# Patient Record
Sex: Female | Born: 1945 | Race: Black or African American | Hispanic: No | Smoking: Former smoker
Health system: Southern US, Community
[De-identification: ages and names within clinical notes are randomized; demographics above are authoritative.]

## PROBLEM LIST (undated history)

## (undated) DIAGNOSIS — I10 Essential (primary) hypertension: Secondary | ICD-10-CM

## (undated) DIAGNOSIS — Z923 Personal history of irradiation: Secondary | ICD-10-CM

## (undated) DIAGNOSIS — I639 Cerebral infarction, unspecified: Secondary | ICD-10-CM

## (undated) HISTORY — DX: Cerebral infarction, unspecified: I63.9

## (undated) HISTORY — PX: RENAL ARTERY ANGIOPLASTY: SHX2317

## (undated) HISTORY — PX: ABDOMINAL HYSTERECTOMY: SHX81

## (undated) HISTORY — PX: CHOLECYSTECTOMY: SHX55

---

## 1997-08-22 ENCOUNTER — Other Ambulatory Visit: Admission: RE | Admit: 1997-08-22 | Discharge: 1997-08-22 | Payer: Self-pay | Admitting: Obstetrics and Gynecology

## 1999-04-11 ENCOUNTER — Other Ambulatory Visit: Admission: RE | Admit: 1999-04-11 | Discharge: 1999-04-11 | Payer: Self-pay | Admitting: Obstetrics and Gynecology

## 2000-02-14 ENCOUNTER — Emergency Department (HOSPITAL_COMMUNITY): Admission: EM | Admit: 2000-02-14 | Discharge: 2000-02-14 | Payer: Self-pay | Admitting: Emergency Medicine

## 2004-11-16 ENCOUNTER — Ambulatory Visit (HOSPITAL_COMMUNITY): Admission: RE | Admit: 2004-11-16 | Discharge: 2004-11-16 | Payer: Self-pay | Admitting: Gastroenterology

## 2004-12-06 ENCOUNTER — Encounter (HOSPITAL_COMMUNITY): Admission: RE | Admit: 2004-12-06 | Discharge: 2004-12-27 | Payer: Self-pay | Admitting: Endocrinology

## 2005-02-08 ENCOUNTER — Encounter (HOSPITAL_COMMUNITY): Admission: RE | Admit: 2005-02-08 | Discharge: 2005-05-09 | Payer: Self-pay | Admitting: Endocrinology

## 2011-07-01 ENCOUNTER — Emergency Department (HOSPITAL_COMMUNITY): Payer: BC Managed Care – PPO

## 2011-07-01 ENCOUNTER — Inpatient Hospital Stay (HOSPITAL_COMMUNITY): Payer: BC Managed Care – PPO

## 2011-07-01 ENCOUNTER — Encounter (HOSPITAL_COMMUNITY): Payer: Self-pay | Admitting: *Deleted

## 2011-07-01 ENCOUNTER — Inpatient Hospital Stay (HOSPITAL_COMMUNITY)
Admission: EM | Admit: 2011-07-01 | Discharge: 2011-07-05 | DRG: 810 | Disposition: A | Discharge status: Rehabilitation Facility | Payer: BC Managed Care – PPO | Source: Ambulatory Visit | Attending: Infectious Disease | Admitting: Infectious Disease

## 2011-07-01 DIAGNOSIS — I619 Nontraumatic intracerebral hemorrhage, unspecified: Secondary | ICD-10-CM

## 2011-07-01 DIAGNOSIS — I7789 Other specified disorders of arteries and arterioles: Secondary | ICD-10-CM | POA: Diagnosis present

## 2011-07-01 DIAGNOSIS — Z87891 Personal history of nicotine dependence: Secondary | ICD-10-CM

## 2011-07-01 DIAGNOSIS — Z9089 Acquired absence of other organs: Secondary | ICD-10-CM

## 2011-07-01 DIAGNOSIS — I639 Cerebral infarction, unspecified: Secondary | ICD-10-CM | POA: Diagnosis present

## 2011-07-01 DIAGNOSIS — R7309 Other abnormal glucose: Secondary | ICD-10-CM | POA: Diagnosis present

## 2011-07-01 DIAGNOSIS — Z90710 Acquired absence of both cervix and uterus: Secondary | ICD-10-CM

## 2011-07-01 DIAGNOSIS — R5383 Other fatigue: Secondary | ICD-10-CM

## 2011-07-01 DIAGNOSIS — Z882 Allergy status to sulfonamides status: Secondary | ICD-10-CM

## 2011-07-01 DIAGNOSIS — R531 Weakness: Secondary | ICD-10-CM

## 2011-07-01 DIAGNOSIS — E039 Hypothyroidism, unspecified: Secondary | ICD-10-CM | POA: Diagnosis present

## 2011-07-01 DIAGNOSIS — E785 Hyperlipidemia, unspecified: Secondary | ICD-10-CM | POA: Diagnosis present

## 2011-07-01 DIAGNOSIS — D649 Anemia, unspecified: Secondary | ICD-10-CM | POA: Diagnosis present

## 2011-07-01 DIAGNOSIS — R5381 Other malaise: Secondary | ICD-10-CM

## 2011-07-01 DIAGNOSIS — I10 Essential (primary) hypertension: Secondary | ICD-10-CM

## 2011-07-01 DIAGNOSIS — E876 Hypokalemia: Secondary | ICD-10-CM | POA: Diagnosis present

## 2011-07-01 DIAGNOSIS — I150 Renovascular hypertension: Secondary | ICD-10-CM | POA: Diagnosis present

## 2011-07-01 DIAGNOSIS — I6330 Cerebral infarction due to thrombosis of unspecified cerebral artery: Secondary | ICD-10-CM

## 2011-07-01 HISTORY — DX: Personal history of irradiation: Z92.3

## 2011-07-01 HISTORY — DX: Essential (primary) hypertension: I10

## 2011-07-01 LAB — BASIC METABOLIC PANEL
BUN: 17 mg/dL (ref 6–23)
CO2: 23 mEq/L (ref 19–32)
Calcium: 9.1 mg/dL (ref 8.4–10.5)
Chloride: 97 mEq/L (ref 96–112)
Creatinine, Ser: 0.89 mg/dL (ref 0.50–1.10)
GFR calc Af Amer: 77 mL/min — ABNORMAL LOW (ref >90)
GFR calc non Af Amer: 67 mL/min — ABNORMAL LOW (ref >90)
Glucose, Bld: 161 mg/dL — ABNORMAL HIGH (ref 70–99)
Potassium: 3 mEq/L — ABNORMAL LOW (ref 3.5–5.1)
Sodium: 136 mEq/L (ref 135–145)

## 2011-07-01 LAB — URINALYSIS, ROUTINE W REFLEX MICROSCOPIC
Bilirubin Urine: NEGATIVE
Glucose, UA: NEGATIVE mg/dL
Hgb urine dipstick: NEGATIVE
Ketones, ur: NEGATIVE mg/dL
Nitrite: NEGATIVE
Protein, ur: 30 mg/dL — AB
Specific Gravity, Urine: 1.012 (ref 1.005–1.030)
Urobilinogen, UA: 0.2 mg/dL (ref 0.0–1.0)
pH: 7 (ref 5.0–8.0)

## 2011-07-01 LAB — CK TOTAL AND CKMB (NOT AT ARMC)
CK, MB: 12.1 ng/mL (ref 0.3–4.0)
Relative Index: 1.2 (ref 0.0–2.5)
Total CK: 1045 U/L — ABNORMAL HIGH (ref 7–177)

## 2011-07-01 LAB — DIFFERENTIAL
Basophils Absolute: 0 K/uL (ref 0.0–0.1)
Basophils Relative: 0 % (ref 0–1)
Eosinophils Absolute: 0 K/uL (ref 0.0–0.7)
Eosinophils Relative: 1 % (ref 0–5)
Lymphocytes Relative: 27 % (ref 12–46)
Lymphs Abs: 1.5 K/uL (ref 0.7–4.0)
Monocytes Absolute: 0.4 K/uL (ref 0.1–1.0)
Monocytes Relative: 6 % (ref 3–12)
Neutro Abs: 3.7 K/uL (ref 1.7–7.7)
Neutrophils Relative %: 66 % (ref 43–77)

## 2011-07-01 LAB — CBC
HCT: 29.1 % — ABNORMAL LOW (ref 36.0–46.0)
Hemoglobin: 10.4 g/dL — ABNORMAL LOW (ref 12.0–15.0)
Hemoglobin: 10.9 g/dL — ABNORMAL LOW (ref 12.0–15.0)
MCH: 30.4 pg (ref 26.0–34.0)
MCHC: 35.7 g/dL (ref 30.0–36.0)
MCHC: 36.2 g/dL — ABNORMAL HIGH (ref 30.0–36.0)
MCV: 85.1 fL (ref 78.0–100.0)
Platelets: 149 K/uL — ABNORMAL LOW (ref 150–400)
Platelets: 160 10*3/uL (ref 150–400)
RBC: 3.42 MIL/uL — ABNORMAL LOW (ref 3.87–5.11)
RBC: 3.56 MIL/uL — ABNORMAL LOW (ref 3.87–5.11)
RDW: 14.8 % (ref 11.5–15.5)
WBC: 5.7 K/uL (ref 4.0–10.5)

## 2011-07-01 LAB — RAPID URINE DRUG SCREEN, HOSP PERFORMED
Amphetamines: NOT DETECTED
Barbiturates: NOT DETECTED
Benzodiazepines: NOT DETECTED
Tetrahydrocannabinol: NOT DETECTED

## 2011-07-01 LAB — POCT I-STAT, CHEM 8
Chloride: 104 mEq/L (ref 96–112)
Creatinine, Ser: 1 mg/dL (ref 0.50–1.10)
Glucose, Bld: 162 mg/dL — ABNORMAL HIGH (ref 70–99)
Potassium: 3 mEq/L — ABNORMAL LOW (ref 3.5–5.1)

## 2011-07-01 LAB — MRSA PCR SCREENING: MRSA by PCR: NEGATIVE

## 2011-07-01 LAB — HEPATIC FUNCTION PANEL
ALT: 30 U/L (ref 0–35)
AST: 44 U/L — ABNORMAL HIGH (ref 0–37)
Albumin: 4 g/dL (ref 3.5–5.2)
Total Bilirubin: 0.3 mg/dL (ref 0.3–1.2)

## 2011-07-01 LAB — URINE MICROSCOPIC-ADD ON

## 2011-07-01 LAB — RETICULOCYTES
RBC.: 3.56 MIL/uL — ABNORMAL LOW (ref 3.87–5.11)
Retic Ct Pct: 1.4 % (ref 0.4–3.1)

## 2011-07-01 LAB — CREATININE, SERUM
Creatinine, Ser: 0.79 mg/dL (ref 0.50–1.10)
GFR calc non Af Amer: 86 mL/min — ABNORMAL LOW (ref >90)

## 2011-07-01 LAB — PROTIME-INR
INR: 1 (ref 0.00–1.49)
Prothrombin Time: 13.4 seconds (ref 11.6–15.2)

## 2011-07-01 LAB — HEMOGLOBIN A1C: Hgb A1c MFr Bld: 6.2 % — ABNORMAL HIGH (ref <5.7)

## 2011-07-01 LAB — APTT: aPTT: 20 seconds — ABNORMAL LOW (ref 24–37)

## 2011-07-01 LAB — TROPONIN I: Troponin I: <0.3 ng/mL (ref <0.30)

## 2011-07-01 MED ORDER — SENNOSIDES-DOCUSATE SODIUM 8.6-50 MG PO TABS
1.0000 | ORAL_TABLET | Freq: Every evening | ORAL | Status: DC | PRN
  Administered 2011-07-01: 1 via ORAL
  Filled 2011-07-01: qty 1

## 2011-07-01 MED ORDER — ONDANSETRON HCL 4 MG/2ML IJ SOLN
4.0000 mg | Freq: Four times a day (QID) | INTRAMUSCULAR | Status: DC | PRN
  Administered 2011-07-01: 4 mg via INTRAVENOUS
  Filled 2011-07-01: qty 2

## 2011-07-01 MED ORDER — SENNOSIDES-DOCUSATE SODIUM 8.6-50 MG PO TABS
1.0000 | ORAL_TABLET | Freq: Two times a day (BID) | ORAL | Status: DC
  Administered 2011-07-02 – 2011-07-03 (×4): 1 via ORAL
  Filled 2011-07-01 (×9): qty 1

## 2011-07-01 MED ORDER — TRAMADOL HCL 50 MG PO TABS
50.0000 mg | ORAL_TABLET | Freq: Four times a day (QID) | ORAL | Status: DC | PRN
  Filled 2011-07-01: qty 2

## 2011-07-01 MED ORDER — LABETALOL HCL 5 MG/ML IV SOLN: 10.0000 mg | INTRAVENOUS | Status: DC | PRN

## 2011-07-01 MED ORDER — SODIUM CHLORIDE 0.9 % IV BOLUS (SEPSIS)
1000.0000 mL | Freq: Once | INTRAVENOUS | Status: AC
  Administered 2011-07-01: 1000 mL via INTRAVENOUS

## 2011-07-01 MED ORDER — POTASSIUM CHLORIDE 20 MEQ/15ML (10%) PO LIQD
ORAL | Status: AC
  Filled 2011-07-01: qty 30

## 2011-07-01 MED ORDER — POTASSIUM CHLORIDE 20 MEQ/15ML (10%) PO LIQD
40.0000 meq | ORAL | Status: AC
  Administered 2011-07-01 (×2): 40 meq via ORAL
  Filled 2011-07-01 (×2): qty 30

## 2011-07-01 MED ORDER — PANTOPRAZOLE SODIUM 40 MG IV SOLR
40.0000 mg | Freq: Every day | INTRAVENOUS | Status: DC
  Administered 2011-07-01: 40 mg via INTRAVENOUS
  Filled 2011-07-01 (×2): qty 40

## 2011-07-01 MED ORDER — SODIUM CHLORIDE 0.9 % IV SOLN
INTRAVENOUS | Status: DC
  Administered 2011-07-01 – 2011-07-02 (×4): via INTRAVENOUS

## 2011-07-01 MED ORDER — ACETAMINOPHEN 650 MG RE SUPP: 650.0000 mg | RECTAL | Status: DC | PRN

## 2011-07-01 MED ORDER — HEPARIN SODIUM (PORCINE) 5000 UNIT/ML IJ SOLN
5000.0000 [IU] | Freq: Three times a day (TID) | INTRAMUSCULAR | Status: DC
  Filled 2011-07-01 (×2): qty 1

## 2011-07-01 MED ORDER — ACETAMINOPHEN 325 MG PO TABS: 650.0000 mg | ORAL_TABLET | ORAL | Status: DC | PRN

## 2011-07-01 MED ORDER — LABETALOL HCL 5 MG/ML IV SOLN
10.0000 mg | INTRAVENOUS | Status: DC | PRN
  Administered 2011-07-01 (×2): 40 mg via INTRAVENOUS
  Administered 2011-07-01: 20 mg via INTRAVENOUS
  Administered 2011-07-01 – 2011-07-02 (×2): 10 mg via INTRAVENOUS
  Administered 2011-07-02: 40 mg via INTRAVENOUS
  Administered 2011-07-02 (×2): 20 mg via INTRAVENOUS
  Administered 2011-07-02: 10 mg via INTRAVENOUS
  Administered 2011-07-02: 40 mg via INTRAVENOUS
  Administered 2011-07-02 – 2011-07-03 (×2): 20 mg via INTRAVENOUS
  Administered 2011-07-03 (×2): 40 mg via INTRAVENOUS
  Administered 2011-07-03 – 2011-07-04 (×2): 20 mg via INTRAVENOUS
  Filled 2011-07-01: qty 8
  Filled 2011-07-01 (×2): qty 4
  Filled 2011-07-01 (×2): qty 8
  Filled 2011-07-01: qty 4
  Filled 2011-07-01 (×5): qty 8
  Filled 2011-07-01: qty 4
  Filled 2011-07-01 (×4): qty 8
  Filled 2011-07-01: qty 4

## 2011-07-01 MED ORDER — MORPHINE SULFATE 2 MG/ML IJ SOLN: 1.0000 mg | INTRAMUSCULAR | Status: DC | PRN

## 2011-07-01 NOTE — ED Notes (Signed)
Patient unable to stand to finish orthostatic vital signs

## 2011-07-01 NOTE — ED Notes (Signed)
Jacqueline King- pt brother [phone] home [phone] cell Call with any changes.

## 2011-07-01 NOTE — ED Provider Notes (Signed)
65yo GCS 15 last known well 11:00 AM this morning and sudden onset generalized weakness, pre-syncope, vertigo, gait ataxia, slightly slurred and slowed speech without lateralizing weakness or numbness or facial asymmetry or difficulty swallowing and no headache and no pain no chest pain no shortness breath.  PERRL, EOMI, multi-directional nystagmus including vertigo and rotary as well as lateral present even at rest, also has slightly disconjugate gaze when each eye is covered and uncovered rapidly, no facial asymmetry, normal facial light touch, no pronator drift in arms or legs normal light touch in arms and legs normal bilateral finger to nose testing, the patient is able to sit unassisted without truncal ataxia but is not able to stand and walk due to gait ataxia with vertigo  Medical screening examination/treatment/procedure(s) were conducted as a shared visit with non-physician practitioner(s) and myself.  I personally evaluated the patient during the encounter  [facility]w [facility] recs no Code Stroke since non-focal; tPA in stroke considered, but not given due to the following: Unable to determine eligibility (Dx unclear and nonfocal)  MR d/w rads and Neuro.  Dx: Intracranial Hemorrhage Stroke Vertigo  CRITICAL CARE Performed by: Hurman Horn   Total critical care time:  Critical care time was exclusive of separately billable procedures and treating other patients.  Critical care was necessary to treat or prevent imminent or life-threatening deterioration.  Critical care was time spent personally by me on the following activities: development of treatment plan with patient and/or surrogate as well as nursing, discussions with consultants, evaluation of patient's response to treatment, examination of patient, obtaining history from patient or surrogate, ordering and performing treatments and interventions, ordering and review of laboratory studies, ordering and review of  radiographic studies, pulse oximetry and re-evaluation of patient's condition.  Hurman Horn, MD 07/01/11 [phone]

## 2011-07-01 NOTE — H&P (Addendum)
Medical Student Hospital Admission Note Date: 07/01/2011  Patient name: Jacqueline King Medical record number: [id number] Date of birth: 11/13/1945 Age: 66 y.o. Gender: female PCP: DEFAULT,PROVIDER, MD, MD  Medical Service: Toniann Ket  Attending physician: Dr. Daiva Eves      Chief Complaint: Weakness and Dizziness  History of Present Illness: 66 yo female with a PMH of uncontrolled hypertension presented to the ED with dizziness that started this morning while she was at work. Once the dizziness started, she laid down on the floor. She also complained of nausea. There was no LOC, weakness, vomiting, or headache. No alleviating factors, but movement exacerbated her dizziness  Meds: None.  Allergies: Allergies as of 07/01/2011 - Review Complete 07/01/2011  Allergen Reaction Noted  . Sulfa drugs cross reactors Hives 07/01/2011   Past Medical History  Diagnosis Date  . Hypertension    Past Surgical History  Procedure Date  . Abdominal hysterectomy   . Cholecystectomy    No family history on file. History   Social History  . Marital Status: Divorced    Spouse Name: N/A    Number of Children: N/A  . Years of Education: N/A   Occupational History  . Not on file.   Social History Main Topics  . Smoking status: Former Games developer  . Smokeless tobacco: Not on file  . Alcohol Use: No  . Drug Use:   . Sexually Active:    Other Topics Concern  . Not on file   Social History Narrative  . No narrative on file    Review of Systems:  General: no fevers, chills, changes in weight, changes in appetite Skin: no rash HEENT: blurry vision, no hearing changes, no sore throat Pulm: no dyspnea, coughing, wheezing CV: no chest pain, palpitations, shortness of breath Abd: no abdominal pain, nausea/vomiting, diarrhea/constipation GU: no dysuria, hematuria, polyuria Ext: no arthralgias, myalgias Neuro: dizziness, no numbness or tingling   Physical Exam: Blood pressure 206/104, pulse 64,  temperature 97.5 F (36.4 C), temperature source Oral, resp. rate 13, SpO2 96.00%.  General: resting in bed, NAD, slow speech HEENT: PERRL, EOMI, no scleral icterus Cardiac: RRR, no rubs, murmurs or gallops Pulm: clear to auscultation bilaterally, moving normal volumes of air Abd: soft, nontender, nondistended, BS present Ext: warm and well perfused, no pedal edema Neuro: alert and oriented X3, cranial nerves II-XII grossly intact    Lab results: Basic Metabolic Panel:  Basename 07/01/11 1401 07/01/11 1250  NA 140 136  K 3.0* 3.0*  CL 104 97  CO2 -- 23  GLUCOSE 162* 161*  BUN 17 17  CREATININE 1.00 0.89  CALCIUM -- 9.1  MG -- --  PHOS -- --   Liver Function Tests:  [facility] 07/01/11 1342  AST 44*  ALT 30  ALKPHOS 43  BILITOT 0.3  PROT 7.4  ALBUMIN 4.0   CBC:  Basename 07/01/11 1401 07/01/11 1250  WBC -- 5.7  NEUTROABS -- 3.7  HGB 10.5* 10.4*  HCT 31.0* 29.1*  MCV -- 85.1  PLT -- 149*   Cardiac Enzymes:  Basename 07/01/11 1355 07/01/11 1255  CKTOTAL 1045* --  CKMB 12.1* --  CKMBINDEX -- --  TROPONINI -- <0.30   CBG:  Basename 07/01/11 1412  GLUCAP 147*   Coagulation:  Basename 07/01/11 1341  LABPROT 13.4  INR 1.00   Urine Drug Screen: Drugs of Abuse     Component Value Date/Time   LABOPIA NONE DETECTED 07/01/2011 1336   COCAINSCRNUR NONE DETECTED 07/01/2011 1336   LABBENZ  NONE DETECTED 07/01/2011 1336   AMPHETMU NONE DETECTED 07/01/2011 1336   THCU NONE DETECTED 07/01/2011 1336   LABBARB NONE DETECTED 07/01/2011 1336    Urinalysis:  Basename 07/01/11 1318  COLORURINE YELLOW  LABSPEC 1.012  PHURINE 7.0  GLUCOSEU NEGATIVE  HGBUR NEGATIVE  BILIRUBINUR NEGATIVE  KETONESUR NEGATIVE  PROTEINUR 30*  UROBILINOGEN 0.2  NITRITE NEGATIVE  LEUKOCYTESUR TRACE*   Imaging results:  Ct Head Wo Contrast  07/01/2011  *RADIOLOGY REPORT*  Clinical Data: Near syncope  CT HEAD WITHOUT CONTRAST  Technique:  Contiguous axial images were obtained from the base  of the skull through the vertex without contrast.  Comparison: None.  Findings: There is asymmetric curvilinear high density along the right aspect of the fourth ventricle on image number 13.  Linear high density projects within the central portion of the fourth ventricle and third ventricle on images 12 and 13.  This finding could reflect benign prominent choroid plexus, however a small amount of acute hemorrhage is difficult to exclude.  There are no prior studies for comparison.  There is patchy hypodensity in the periventricular white matter bilaterally suggesting chronic microvascular ischemic changes.  The ventricles are normal in size.  No evidence of acute cortically based infarction.  Negative for mass effect or midline shift.  The visualized paranasal sinuses, mastoid air cells, and middle ears are clear.  The mastoid air cells are clear.  The skull is intact.  Soft tissues of the scalp are symmetric.  IMPRESSION: 1. A small amount of acute intraventricular hemorrhage cannot be excluded in the third and fourth ventricles.  Alternatively, this high density could reflect prominent choroid plexus.  Further evaluation of the brain with and without contrast is suggested. Critical Value/emergent results were called by telephone at the time of interpretation on 07/01/2011  at 3:05 p.m.  to  Dr. Fonnie Jarvis, who verbally acknowledged these results. 2.  Chronic microvascular ischemic changes.  Original Report Authenticated By: Britta Mccreedy, M.D.   Dg Chest Port 1 View  07/01/2011  *RADIOLOGY REPORT*  Clinical Data: Dizzy, nausea  PORTABLE CHEST - 1 VIEW  Comparison: None.  Findings: Mild cardiomegaly.  Suspect left pleural effusion. Atelectasis or infiltrate at the left lung base.  Low lung volumes with resultant crowding of bronchovascular structures.  Regional bones unremarkable.  IMPRESSION:  1.  Probable left pleural effusion and left lower lung infiltrate or atelectasis. 2.  Mild cardiomegaly.  Original Report  Authenticated By: Osa Craver, M.D.    Other results: EKG: normal EKG, normal sinus rhythm, unchanged from previous tracings, sinus bradycardia.  Assessment & Plan by Problem:  Dizziness Based on patient's history of an episode of weakness and dizziness, coupled with hypertension, we wanted to work up stroke/TIA:  -CT done - inconclusive. Therefore MRI ordered to check for cerebral infarcts  Differentials: Stroke/TIA vs. Hypothyroidism vs. Orthostatic hypotension vs. MS vs. Subdural Hematoma (r/o from CT scan) vs.  Seizure vs. Tumor (r/o with CT) vs. Electrolyte Disturbance.  Hypertension Hypertension will not be immediately addressed as the possibility of ischemic stroke exists therefore lowering the blood pressure is contraindicated.  Elevated Cardiac Enzymes Troponins are negative and CK MB and total CK are increased indicated cardiac muscle injury. MI can be r/o once Tropinins are negative x 3 (presently negative X 1).   Anemia Anemia can be secondary to GI loss vs. Iron deficiency anemia  -Ferrous Sulfate -Iron Panel -Stool for occult blood/Rectal Exam  Hypokalemia Low potassium can cause cardiac changes and muscle  weakness.  -K Dur 40mg  PO X 2  Hyperglycemia Pt has no history of diabetes, but we will check HbA1c to see glucose status.  -No immediate treatment  Proteinuria Possibly due to HTN or an undiagnosed diabetes causing glomerular damage.  -urine dipstick  S/p Radioactive therapy for hyperthyroidism Pt was prescribed to be taking synthroid after her radioactive therapy but has not been on any medication. We will check her TSH and medicate appropriately with Synthroid.   This is a Psychologist, occupational Note.  The care of the patient was discussed with Dr. Gilford Rile and the assessment and plan was formulated with their assistance.  Please see their note for official documentation of the patient encounter.   Signed: Verlin Grills 07/01/2011, 4:19 PM        Resident Hospital Admission Note Date: 07/01/2011  Patient name: Jacqueline King Medical record number: [id number] Date of birth: [birth year] Age: 66 y.o. Gender: female PCP: DEFAULT,PROVIDER, MD, MD  Medical Service: Leitha Bleak  Attending physician: Dr. Daiva Eves   1st Contact:     T. Sherryll Burger                                  Pager: [phone] 2nd Contact:    Dr. Dorthula Rue                          Pager: [phone]  After 5 pm or weekends: 1st Contact:      Pager: [phone] 2nd Contact:      Pager: [phone]   Chief Complaint: Dizziness   History of Present Illness:  Patient is a 66 year old female with a past medical history significant for thyroid ablation, who presented to the [place] ED reporting feeling of dizziness and a sensation that the room was moving and she was feeling like she was going to pass out so she lowered herself to the ground. Patient reports that when she closes her eyes the dizziness seems to be better. The patient denies chest pain, shortness of breath, abdominal pain, vomiting, visual changes, fever, headache, or dysuria. Patient does report nausea since the onset of her symptoms. Head CT obtained in the ED was negative for acute stroke, neuro hospitalist were consulted, I suggested a medical admission with neurology consult for further workup of possible acute stroke.   Meds: No prescriptions prior to admission    Allergies: Allergies as of 07/01/2011 - Review Complete 07/01/2011  Allergen Reaction Noted  . Sulfa drugs cross reactors Hives 07/01/2011   Past Medical History  Diagnosis Date  . Hypertension   . H/O radioactive iodine thyroid ablation    Past Surgical History  Procedure Date  . Abdominal hysterectomy   . Cholecystectomy   . Angioplasty 1993 or 1994   Family History  Problem Relation Age of Onset  . Diabetes Mother   . Diabetes Sister   . Diabetes Brother    History   Social History  . Marital Status: Divorced    Spouse Name: N/A     Number of Children: N/A  . Years of Education: N/A   Occupational History  . Not on file.   Social History Main Topics  . Smoking status: Former Games developer  . Smokeless tobacco: Not on file  . Alcohol Use: No  . Drug Use: No  . Sexually Active: Not on file   Other Topics Concern  . Not on file   Social  History Narrative  . No narrative on file    Review of Systems: Negative except per history of present  Physical Exam: Blood pressure 188/114, pulse 67, temperature 97.4 F (36.3 C), temperature source Oral, resp. rate 15, height 5\' 1"  (1.549 m), weight 128 lb 1.6 oz (58.106 kg), SpO2 99.00%. General: resting in bed, NAD, slow speech HEENT: PERRL, EOMI, no scleral icterus Cardiac: RRR, no rubs, murmurs or gallops Pulm: clear to auscultation bilaterally Abd: soft, nontender, nondistended, BS present Ext: warm and well perfused, no pedal edema Neuro: alert and oriented X3, cranial nerves II-XII grossly intact, no major focal abnormality detected.   Lab results: Basic Metabolic Panel:  Basename 07/01/11 1401 07/01/11 1250  NA 140 136  K 3.0* 3.0*  CL 104 97  CO2 -- 23  GLUCOSE 162* 161*  BUN 17 17  CREATININE 1.00 0.89  CALCIUM -- 9.1  MG -- --  PHOS -- --   Liver Function Tests:  Basename 07/01/11 1342  AST 44*  ALT 30  ALKPHOS 43  BILITOT 0.3  PROT 7.4  ALBUMIN 4.0   CBC:  Basename 07/01/11 1401 07/01/11 1250  WBC -- 5.7  NEUTROABS -- 3.7  HGB 10.5* 10.4*  HCT 31.0* 29.1*  MCV -- 85.1  PLT -- 149*   Cardiac Enzymes:  Basename 07/01/11 1355 07/01/11 1255  CKTOTAL 1045* --  CKMB 12.1* --  CKMBINDEX -- --  TROPONINI -- <0.30   CBG:  Basename 07/01/11 1412  GLUCAP 147*   Coagulation:  Basename 07/01/11 1341  LABPROT 13.4  INR 1.00   Urine Drug Screen: Drugs of Abuse     Component Value Date/Time   LABOPIA NONE DETECTED 07/01/2011 1336   COCAINSCRNUR NONE DETECTED 07/01/2011 1336   LABBENZ NONE DETECTED 07/01/2011 1336   AMPHETMU NONE  DETECTED 07/01/2011 1336   THCU NONE DETECTED 07/01/2011 1336   LABBARB NONE DETECTED 07/01/2011 1336    Urinalysis:  Basename 07/01/11 1318  COLORURINE YELLOW  LABSPEC 1.012  PHURINE 7.0  GLUCOSEU NEGATIVE  HGBUR NEGATIVE  BILIRUBINUR NEGATIVE  KETONESUR NEGATIVE  PROTEINUR 30*  UROBILINOGEN 0.2  NITRITE NEGATIVE  LEUKOCYTESUR TRACE*    Imaging results:  Ct Head Wo Contrast  07/01/2011  *RADIOLOGY REPORT*  Clinical Data: Near syncope  CT HEAD WITHOUT CONTRAST  Technique:  Contiguous axial images were obtained from the base of the skull through the vertex without contrast.  Comparison: None.  Findings: There is asymmetric curvilinear high density along the right aspect of the fourth ventricle on image number 13.  Linear high density projects within the central portion of the fourth ventricle and third ventricle on images 12 and 13.  This finding could reflect benign prominent choroid plexus, however a small amount of acute hemorrhage is difficult to exclude.  There are no prior studies for comparison.  There is patchy hypodensity in the periventricular white matter bilaterally suggesting chronic microvascular ischemic changes.  The ventricles are normal in size.  No evidence of acute cortically based infarction.  Negative for mass effect or midline shift.  The visualized paranasal sinuses, mastoid air cells, and middle ears are clear.  The mastoid air cells are clear.  The skull is intact.  Soft tissues of the scalp are symmetric.  IMPRESSION: 1. A small amount of acute intraventricular hemorrhage cannot be excluded in the third and fourth ventricles.  Alternatively, this high density could reflect prominent choroid plexus.  Further evaluation of the brain with and without contrast is suggested. Critical Value/emergent results  were called by telephone at the time of interpretation on 07/01/2011  at 3:05 p.m.  to  Dr. Fonnie Jarvis, who verbally acknowledged these results. 2.  Chronic microvascular ischemic  changes.  Original Report Authenticated By: Britta Mccreedy, M.D.   Mr Brain Wo Contrast  07/01/2011  *RADIOLOGY REPORT*  Clinical Data: Dizzy with nausea.  Hypertension.  MRI HEAD WITHOUT CONTRAST  Technique:  Multiplanar, multiecho pulse sequences of the brain and surrounding structures were obtained according to standard protocol without intravenous contrast.  Comparison: 07/01/2011 earlier today.  Findings:  There is an acute 9 x 11 mm cross-section hemorrhage involving the right superior vermis/right superior cerebellar peduncle/dorsolateral recess of the fourth ventricle with intraventricular extension. There is some extension into the aqueduct and posterior third ventricle.  No subarachnoid blood. There is a 5 mm area of restricted diffusion immediately adjacent to the hemorrhage suggesting superimposed acute infarction. A 2 mm area of restricted diffusion affects the right thalamus suggesting a second area of acute infarction.  There is atrophy with extensive chronic microvascular ischemic change.  Gradient sequences demonstrate numerous right cerebellar, bilateral thalamic, and right superior temporal foci of hemorrhage representing either hypertensive cerebrovascular disease or cerebral amyloid angiopathy. Large lacunar infarcts are noted in the lower midbrain on the right and left as well as in the periventricular white matter.  Major intracranial vascular structures patent.  Chronic sinus disease affects the left maxillary region.  Compared with the prior CT, the appearance is similar.  IMPRESSION: 9 x 11 mm acute hemorrhage involves the right superior vermis with intraventricular extension; small foci of acute infarction also noted in the right medial thalamus and superior cerebellar peduncle.  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 There is no subarachnoid blood to suggest  posterior circulation aneurysm.  A periventricular cavernoma which has acutely expanded could have this  appearance although I have no previous imaging for comparison; follow-up scanning recommended.  Multiple foci of chronic infarction and chronic hemorrhage are also noted bilaterally as described above.  Findings discussed with EDP.  Original Report Authenticated By: Elsie Stain, M.D.   Dg Chest Port 1 View  07/01/2011  *RADIOLOGY REPORT*  Clinical Data: Dizzy, nausea  PORTABLE CHEST - 1 VIEW  Comparison: None.  Findings: Mild cardiomegaly.  Suspect left pleural effusion. Atelectasis or infiltrate at the left lung base.  Low lung volumes with resultant crowding of bronchovascular structures.  Regional bones unremarkable.  IMPRESSION:  1.  Probable left pleural effusion and left lower lung infiltrate or atelectasis. 2.  Mild cardiomegaly.  Original Report Authenticated By: Osa Craver, M.D.    Assessment & Plan by Problem: I saw and evaluated the patient with T. Sherryll Burger ([email or web address]), and agree with his excellent note above, my assessment and plan in brief are below please review his note for more details;  Dizziness: with generalized weakness. This is possible due to a CVA given the relatively sudden onset of dizziness and weakness that began around 11 AM. Neuro have been consulted in the ED, TPA was not given because lack of clear CVA evidence at that point. Non-contrast CT of the head is negative for acute bleed. It also does not show acute stroke, but this is possible in acute stroke. Also her history of thyroid ablation and lack of medication compliance could mean that hypothyroidism could be a contributing factor to her symptoms.  Plan:  -Admit to neuro floor -Will order MRI head and other stroke core measures such as swallow eval and PT/OT consult. -  Neurology consult for further input   Signed: TOBBIA,PATRICK 07/01/2011, 6:16 PM   Internal Medicine Teaching Service Attending Note Date: 07/02/2011  Patient name: Jacqueline King  Medical record number: [id number]  Date of birth:  [birth year]    This patient has been seen and discussed with the house staff. Please see their note for complete details. 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 We're currently managing her with IV labetalol and adding oral lisinopril and amlodipine. She is to have repeat CT scan today and also going to have transcranial Doppler scans. Greatly appreciate neurology's assistance and a multidisciplinar care of this patient  Acey Lav 07/02/2011, 2:12 PM

## 2011-07-01 NOTE — ED Notes (Signed)
Attempted to call report x 1  

## 2011-07-01 NOTE — ED Notes (Signed)
Sent urine drug as Add On

## 2011-07-01 NOTE — ED Notes (Signed)
RN notified of CBG 147

## 2011-07-01 NOTE — Consult Note (Signed)
[facility] CONSULT NOTE     Reason for Consult: Acute onset of vertigo, nausea and new syncope.    HPI:    Jacqueline King is an 66 y.o. female experienced acute onset of severe vertigo with associated nausea and near syncopal symptoms while at work early this afternoon this afternoon. She has not experienced diplopia. She had no focal weakness or numbness. She also has not experienced headache. She was noted to have nystagmus in all directions of gaze on arrival in the emergency room with nausea as well. No focal deficits were noted. Patient indicated her speech is slightly slurred but no other neurologic changes. CT scan of the head showed equivocal high density abnormality involving third and fourth ventricles. Acute hemorrhage could be ruled out. Further evaluation with MRI showed acute hemorrhage involving the right superior cerebellar vermis with extension into fourth ventricle. There was also evidence of acute infarction involving the right medial thalamus and superior cerebellar peduncle. Atrophy as well as extensive microvascular ischemic changes as noted. Lacunar infarctions were noted in the lower midbrain on the right and left as well as and peri-ventricular white matter. Gradient sequences demonstrate numerous right cerebellar, bilateral thalamic and superior temporal foci of hemorrhage, indicating possible underlying amyloid angiopathy. Patient's symptoms of vertigo improved after arriving in the emergency room. No specific treatment was initiated.  Past Medical History  Diagnosis Date  . Hypertension     Past Surgical History  Procedure Date  . Abdominal hysterectomy   . Cholecystectomy     No family history on file.  Social History:  reports that she has quit smoking. She does not have any smokeless tobacco history on file. She reports that she does not drink alcohol. Her drug history not on file.  Allergies  Allergen Reactions  . Sulfa  Drugs Cross Reactors Hives    Medications:   Prior to Admission: None   Blood pressure 206/104, pulse 64, temperature 97.4 F (36.3 C), temperature source Oral, resp. rate 13, SpO2 96.00%.   Neurologic Examination:  Mental Status: Alert, oriented, thought content appropriate.  Speech was minimally slurred without evidence of aphasia. Able to follow commands without difficulty. Cranial Nerves: II-Visual fields were normal. III/IV/VI-Pupils were equal and reacted. Extraocular movements were full and conjugate with no residual nystagmus in any field of gaze.    V/VII-no facial numbness and no facial weakness. VIII-normal. X-minimal slurring of his speech. XII-midline tongue extension Motor: 5/5 bilaterally with normal tone and bulk Sensory: Normal throughout. Deep Tendon Reflexes: 2+ and symmetric. Plantars: Mute bilaterally Cerebellar: Normal finger-to-nose testing. Carotid auscultation: Normal   No results found for this basename: cbc, bmp, coags, chol, tri, ldl, hga1c    Results for orders placed during the hospital encounter of 07/01/11 (from the past 48 hour(s))  CBC     Status: Abnormal   Collection Time   07/01/11 12:50 PM      Component Value Range Comment   WBC 5.7  4.0 - 10.5 (K/uL)    RBC 3.42 (*) 3.87 - 5.11 (MIL/uL)    Hemoglobin 10.4 (*) 12.0 - 15.0 (g/dL)    HCT 16.1 (*) 09.6 - 46.0 (%)    MCV 85.1  78.0 - 100.0 (fL)    MCH 30.4  26.0 - 34.0 (pg)    MCHC 35.7  30.0 - 36.0 (g/dL)    RDW 04.5  40.9 -  15.5 (%)    Platelets 149 (*) 150 - 400 (K/uL)   DIFFERENTIAL     Status: Normal   Collection Time   07/01/11 12:50 PM      Component Value Range Comment   Neutrophils Relative 66  43 - 77 (%)    Neutro Abs 3.7  1.7 - 7.7 (K/uL)    Lymphocytes Relative 27  12 - 46 (%)    Lymphs Abs 1.5  0.7 - 4.0 (K/uL)    Monocytes Relative 6  3 - 12 (%)    Monocytes Absolute 0.4  0.1 - 1.0 (K/uL)    Eosinophils Relative 1  0 - 5 (%)    Eosinophils Absolute 0.0  0.0 - 0.7  (K/uL)    Basophils Relative 0  0 - 1 (%)    Basophils Absolute 0.0  0.0 - 0.1 (K/uL)   BASIC METABOLIC PANEL     Status: Abnormal   Collection Time   07/01/11 12:50 PM      Component Value Range Comment   Sodium 136  135 - 145 (mEq/L)    Potassium 3.0 (*) 3.5 - 5.1 (mEq/L)    Chloride 97  96 - 112 (mEq/L)    CO2 23  19 - 32 (mEq/L)    Glucose, Bld 161 (*) 70 - 99 (mg/dL)    BUN 17  6 - 23 (mg/dL)    Creatinine, Ser 1.61  0.50 - 1.10 (mg/dL)    Calcium 9.1  8.4 - 10.5 (mg/dL)    GFR calc non Af Amer 67 (*) >90 (mL/min)    GFR calc Af Amer 77 (*) >90 (mL/min)   TROPONIN I     Status: Normal   Collection Time   07/01/11 12:55 PM      Component Value Range Comment   Troponin I <0.30  <0.30 (ng/mL)   URINALYSIS, ROUTINE W REFLEX MICROSCOPIC     Status: Abnormal   Collection Time   07/01/11  1:18 PM      Component Value Range Comment   Color, Urine YELLOW  YELLOW     APPearance CLEAR  CLEAR     Specific Gravity, Urine 1.012  1.005 - 1.030     pH 7.0  5.0 - 8.0     Glucose, UA NEGATIVE  NEGATIVE (mg/dL)    Hgb urine dipstick NEGATIVE  NEGATIVE     Bilirubin Urine NEGATIVE  NEGATIVE     Ketones, ur NEGATIVE  NEGATIVE (mg/dL)    Protein, ur 30 (*) NEGATIVE (mg/dL)    Urobilinogen, UA 0.2  0.0 - 1.0 (mg/dL)    Nitrite NEGATIVE  NEGATIVE     Leukocytes, UA TRACE (*) NEGATIVE    URINE MICROSCOPIC-ADD ON     Status: Normal   Collection Time   07/01/11  1:18 PM      Component Value Range Comment   WBC, UA 0-2  <3 (WBC/hpf)   URINE RAPID DRUG SCREEN (HOSP PERFORMED)     Status: Normal   Collection Time   07/01/11  1:36 PM      Component Value Range Comment   Opiates NONE DETECTED  NONE DETECTED     Cocaine NONE DETECTED  NONE DETECTED     Benzodiazepines NONE DETECTED  NONE DETECTED     Amphetamines NONE DETECTED  NONE DETECTED     Tetrahydrocannabinol NONE DETECTED  NONE DETECTED     Barbiturates NONE DETECTED  NONE DETECTED    PROTIME-INR     Status:  Normal   Collection Time   07/01/11   1:41 PM      Component Value Range Comment   Prothrombin Time 13.4  11.6 - 15.2 (seconds)    INR 1.00  0.00 - 1.49    APTT     Status: Abnormal   Collection Time   07/01/11  1:41 PM      Component Value Range Comment   aPTT 20 (*) 24 - 37 (seconds)   HEPATIC FUNCTION PANEL     Status: Abnormal   Collection Time   07/01/11  1:42 PM      Component Value Range Comment   Total Protein 7.4  6.0 - 8.3 (g/dL)    Albumin 4.0  3.5 - 5.2 (g/dL)    AST 44 (*) 0 - 37 (U/L)    ALT 30  0 - 35 (U/L)    Alkaline Phosphatase 43  39 - 117 (U/L)    Total Bilirubin 0.3  0.3 - 1.2 (mg/dL)    Bilirubin, Direct <1.6  0.0 - 0.3 (mg/dL)    Indirect Bilirubin NOT CALCULATED  0.3 - 0.9 (mg/dL)   CK TOTAL AND CKMB     Status: Abnormal   Collection Time   07/01/11  1:55 PM      Component Value Range Comment   Total CK 1045 (*) 7 - 177 (U/L)    CK, MB 12.1 (*) 0.3 - 4.0 (ng/mL)    Relative Index 1.2  0.0 - 2.5    POCT I-STAT, CHEM 8     Status: Abnormal   Collection Time   07/01/11  2:01 PM      Component Value Range Comment   Sodium 140  135 - 145 (mEq/L)    Potassium 3.0 (*) 3.5 - 5.1 (mEq/L)    Chloride 104  96 - 112 (mEq/L)    BUN 17  6 - 23 (mg/dL)    Creatinine, Ser 1.09  0.50 - 1.10 (mg/dL)    Glucose, Bld 604 (*) 70 - 99 (mg/dL)    Calcium, Ion 5.40 (*) 1.12 - 1.32 (mmol/L)    TCO2 24  0 - 100 (mmol/L)    Hemoglobin 10.5 (*) 12.0 - 15.0 (g/dL)    HCT 98.1 (*) 19.1 - 46.0 (%)   GLUCOSE, CAPILLARY     Status: Abnormal   Collection Time   07/01/11  2:12 PM      Component Value Range Comment   Glucose-Capillary 147 (*) 70 - 99 (mg/dL)    Comment 1 Notify RN       Ct Head Wo Contrast  07/01/2011  *RADIOLOGY REPORT*  Clinical Data: Near syncope  CT HEAD WITHOUT CONTRAST  Technique:  Contiguous axial images were obtained from the base of the skull through the vertex without contrast.  Comparison: None.  Findings: There is asymmetric curvilinear high density along the right aspect of the fourth ventricle  on image number 13.  Linear high density projects within the central portion of the fourth ventricle and third ventricle on images 12 and 13.  This finding could reflect benign prominent choroid plexus, however a small amount of acute hemorrhage is difficult to exclude.  There are no prior studies for comparison.  There is patchy hypodensity in the periventricular white matter bilaterally suggesting chronic microvascular ischemic changes.  The ventricles are normal in size.  No evidence of acute cortically based infarction.  Negative for mass effect or midline shift.  The visualized paranasal sinuses, mastoid air cells, and middle  ears are clear.  The mastoid air cells are clear.  The skull is intact.  Soft tissues of the scalp are symmetric.  IMPRESSION: 1. A small amount of acute intraventricular hemorrhage cannot be excluded in the third and fourth ventricles.  Alternatively, this high density could reflect prominent choroid plexus.  Further evaluation of the brain with and without contrast is suggested. Critical Value/emergent results were called by telephone at the time of interpretation on 07/01/2011  at 3:05 p.m.  to  Dr. Fonnie Jarvis, who verbally acknowledged these results. 2.  Chronic microvascular ischemic changes.  Original Report Authenticated By: Britta Mccreedy, M.D.   Dg Chest Port 1 View  07/01/2011  *RADIOLOGY REPORT*  Clinical Data: Dizzy, nausea  PORTABLE CHEST - 1 VIEW  Comparison: None.  Findings: Mild cardiomegaly.  Suspect left pleural effusion. Atelectasis or infiltrate at the left lung base.  Low lung volumes with resultant crowding of bronchovascular structures.  Regional bones unremarkable.  IMPRESSION:  1.  Probable left pleural effusion and left lower lung infiltrate or atelectasis. 2.  Mild cardiomegaly.  Original Report Authenticated By: Osa Craver, M.D.     Assessment/Plan:   1. Acute right superior cerebellar vermis hemorrhage with extension into fourth and third  ventricles, likely of hypertensive origin. 2. 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 3. 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 4. Hypertension with apparent poor compliance with treatment.  Recommendations: 1. The patient to neurology ICU overnight for close observation as well as blood pressure control. 2. Repeat CT of the head without contrast in the a.m. 3. MRI and MRA of the brain without contrast. 4. Hemoglobin A1c and fasting lipid panel. 5. Carotid Doppler study. 6. 2-D echocardiogram. 7. Speech therapy, occupational therapy and physical therapy consults. 8. Meclizine 25 mg by mouth every 6 hours when necessary vertigo.  Venetia Maxon M.D.  [facility] [phone]  07/01/2011, 5:08 PM

## 2011-07-01 NOTE — Progress Notes (Signed)
Patient got here at 1730 and MD, just made me aware that she is going to be moved to the ICU. FOR OBSERVATION

## 2011-07-01 NOTE — ED Notes (Signed)
Pt states that she does have some dizziness still

## 2011-07-01 NOTE — ED Notes (Signed)
Pt in MRI.

## 2011-07-01 NOTE — ED Provider Notes (Signed)
History     CSN: [id number]  Arrival date & time 07/01/11  1158   First MD Initiated Contact with Patient 07/01/11 1230      Chief Complaint  Patient presents with  . Near Syncope    (Consider location/radiation/quality/duration/timing/severity/associated sxs/prior treatment) HPI Patient presents emergency Dept. with sudden onset of generalized weakness with dizziness that began around 23 AM.  Patient states that she felt like the room was moving and she was having trouble feeling like she was going to pass out so she lowered herself to the ground.  Patient states that when she closes her eyes the dizziness seems to be better.  The patient is chest pain, shortness of breath abdominal pain, vomiting visual changes, fever, headache, or dysuria.  Patient, says she has had nausea since the onset of her symptoms.  Patient take any treatment prior to arrival for any symptom relief. Past Medical History  Diagnosis Date  . Hypertension     Past Surgical History  Procedure Date  . Abdominal hysterectomy   . Cholecystectomy     No family history on file.  History  Substance Use Topics  . Smoking status: Former Games developer  . Smokeless tobacco: Not on file  . Alcohol Use: No    OB History    Grav Para Term Preterm Abortions TAB SAB Ect Mult Living                  Review of Systems All other systems negative except as documented in the HPI. All pertinent positives and negatives as reviewed in the HPI.  Allergies  Sulfa drugs cross reactors  Home Medications  No current outpatient prescriptions on file.  BP 206/104  Pulse 64  Temp(Src) 97.5 F (36.4 C) (Oral)  Resp 13  SpO2 96%  Physical Exam  Nursing note and vitals reviewed. Constitutional: She is oriented to person, place, and time. She appears well-developed and well-nourished.  HENT:  Head: Normocephalic and atraumatic.  Mouth/Throat: Oropharynx is clear and moist.  Eyes: Pupils are equal, round, and reactive to  light.       Patient has nystagmus in all fields of range of motion of her eye. The patient also has disconjugate gaze as well.   Neck: Normal range of motion. Neck supple.  Cardiovascular: Normal rate, regular rhythm and normal heart sounds.  Exam reveals no gallop and no friction rub.   No murmur heard. Pulmonary/Chest: Effort normal and breath sounds normal.  Neurological: She is alert and oriented to person, place, and time. She has normal strength. No sensory deficit. Gait abnormal. Coordination normal. GCS eye subscore is 4. GCS verbal subscore is 5. GCS motor subscore is 6.       Patient is unable to ambulate. The patient does have some mild difficulty with speaking.   Skin: She is not diaphoretic.    ED Course  Procedures (including critical care time)  Labs Reviewed  CBC - Abnormal; Notable for the following:    RBC 3.42 (*)    Hemoglobin 10.4 (*)    HCT 29.1 (*)    Platelets 149 (*)    All other components within normal limits  URINALYSIS, ROUTINE W REFLEX MICROSCOPIC - Abnormal; Notable for the following:    Protein, ur 30 (*)    Leukocytes, UA TRACE (*)    All other components within normal limits  BASIC METABOLIC PANEL - Abnormal; Notable for the following:    Potassium 3.0 (*)    Glucose, Bld 161 (*)  GFR calc non Af Amer 67 (*)    GFR calc Af Amer 77 (*)    All other components within normal limits  APTT - Abnormal; Notable for the following:    aPTT 20 (*)    All other components within normal limits  CK TOTAL AND CKMB - Abnormal; Notable for the following:    Total CK 1045 (*)    CK, MB 12.1 (*)    All other components within normal limits  HEPATIC FUNCTION PANEL - Abnormal; Notable for the following:    AST 44 (*)    All other components within normal limits  POCT I-STAT, CHEM 8 - Abnormal; Notable for the following:    Potassium 3.0 (*)    Glucose, Bld 162 (*)    Calcium, Ion 1.08 (*)    Hemoglobin 10.5 (*)    HCT 31.0 (*)    All other components  within normal limits  GLUCOSE, CAPILLARY - Abnormal; Notable for the following:    Glucose-Capillary 147 (*)    All other components within normal limits  DIFFERENTIAL  TROPONIN I  PROTIME-INR  URINE RAPID DRUG SCREEN (HOSP PERFORMED)  URINE MICROSCOPIC-ADD ON   Dg Chest Port 1 View  07/01/2011  *RADIOLOGY REPORT*  Clinical Data: Dizzy, nausea  PORTABLE CHEST - 1 VIEW  Comparison: None.  Findings: Mild cardiomegaly.  Suspect left pleural effusion. Atelectasis or infiltrate at the left lung base.  Low lung volumes with resultant crowding of bronchovascular structures.  Regional bones unremarkable.  IMPRESSION:  1.  Probable left pleural effusion and left lower lung infiltrate or atelectasis. 2.  Mild cardiomegaly.  Original Report Authenticated By: Osa Craver, M.D.     1. Weakness generalized       MDM  Spoke with the Family Practice about the patient and they will admit and the Neurohospitalist will also follow along and consult on the patient. The patient has several PE findings that are concerning for posterior circulation infarct.   MDM Reviewed: nursing note and vitals Interpretation: labs, ECG, CT scan and x-ray Consults: admitting MD and neurology            Carlyle Dolly, PA-C 07/01/11 1510

## 2011-07-01 NOTE — ED Notes (Signed)
Consulting MD at bedside

## 2011-07-01 NOTE — ED Notes (Signed)
Per EMS pt was at work and began feeling dizzy. Pt sat down on the floor. Pt did not loss consciousness. Pt  having intermittent  nausea. BP 200/100. CBG 103. O2 99% on 3L.

## 2011-07-02 ENCOUNTER — Inpatient Hospital Stay (HOSPITAL_COMMUNITY): Payer: BC Managed Care – PPO

## 2011-07-02 DIAGNOSIS — I6330 Cerebral infarction due to thrombosis of unspecified cerebral artery: Secondary | ICD-10-CM

## 2011-07-02 DIAGNOSIS — I619 Nontraumatic intracerebral hemorrhage, unspecified: Secondary | ICD-10-CM

## 2011-07-02 LAB — CBC
MCH: 30.7 pg (ref 26.0–34.0)
MCHC: 36.6 g/dL — ABNORMAL HIGH (ref 30.0–36.0)
Platelets: 139 10*3/uL — ABNORMAL LOW (ref 150–400)
RBC: 3.29 MIL/uL — ABNORMAL LOW (ref 3.87–5.11)
RDW: 14.6 % (ref 11.5–15.5)

## 2011-07-02 LAB — FERRITIN: Ferritin: 90 ng/mL (ref 10–291)

## 2011-07-02 LAB — CARDIAC PANEL(CRET KIN+CKTOT+MB+TROPI)
CK, MB: 9.7 ng/mL (ref 0.3–4.0)
Relative Index: 1 (ref 0.0–2.5)
Total CK: 982 U/L — ABNORMAL HIGH (ref 7–177)
Total CK: 969 U/L — ABNORMAL HIGH (ref 7–177)
Troponin I: <0.3 ng/mL (ref <0.30)

## 2011-07-02 LAB — VITAMIN B12: Vitamin B-12: 511 pg/mL (ref 211–911)

## 2011-07-02 LAB — BASIC METABOLIC PANEL
BUN: 14 mg/dL (ref 6–23)
Calcium: 8.1 mg/dL — ABNORMAL LOW (ref 8.4–10.5)
Creatinine, Ser: 0.81 mg/dL (ref 0.50–1.10)
GFR calc non Af Amer: 75 mL/min — ABNORMAL LOW (ref >90)
Glucose, Bld: 130 mg/dL — ABNORMAL HIGH (ref 70–99)
Sodium: 136 mEq/L (ref 135–145)

## 2011-07-02 LAB — HIV ANTIBODY (ROUTINE TESTING W REFLEX): HIV: NONREACTIVE

## 2011-07-02 LAB — LIPID PANEL
Total CHOL/HDL Ratio: 3 RATIO
VLDL: 10 mg/dL (ref 0–40)

## 2011-07-02 MED ORDER — PANTOPRAZOLE SODIUM 40 MG PO TBEC
40.0000 mg | DELAYED_RELEASE_TABLET | Freq: Every day | ORAL | Status: DC
  Administered 2011-07-02 – 2011-07-04 (×3): 40 mg via ORAL
  Filled 2011-07-02 (×3): qty 1

## 2011-07-02 MED ORDER — AMLODIPINE BESYLATE 5 MG PO TABS
5.0000 mg | ORAL_TABLET | Freq: Every day | ORAL | Status: DC
  Administered 2011-07-02 – 2011-07-03 (×2): 5 mg via ORAL
  Filled 2011-07-02 (×4): qty 1

## 2011-07-02 MED ORDER — LEVOTHYROXINE SODIUM 25 MCG PO TABS
25.0000 ug | ORAL_TABLET | Freq: Every day | ORAL | Status: DC
  Administered 2011-07-02 – 2011-07-05 (×4): 25 ug via ORAL
  Filled 2011-07-02 (×5): qty 1

## 2011-07-02 MED ORDER — LISINOPRIL 10 MG PO TABS
10.0000 mg | ORAL_TABLET | Freq: Every day | ORAL | Status: DC
  Administered 2011-07-02: 10 mg via ORAL
  Filled 2011-07-02 (×2): qty 1

## 2011-07-02 MED ORDER — ATORVASTATIN CALCIUM 20 MG PO TABS
20.0000 mg | ORAL_TABLET | Freq: Every day | ORAL | Status: DC
  Administered 2011-07-02 – 2011-07-03 (×2): 20 mg via ORAL
  Filled 2011-07-02 (×4): qty 1

## 2011-07-02 NOTE — Progress Notes (Signed)
Stroke Team Progress Note  HISTORY Jacqueline King is an 66 y.o. female experienced acute onset of severe vertigo with associated nausea and near syncopal symptoms while at work early this afternoon 07/01/2011. She has not experienced diplopia. She had no focal weakness or numbness. She also has not experienced headache. She was noted to have nystagmus in all directions of gaze on arrival in the emergency room with nausea as well. No focal deficits were noted. Patient indicated her speech is slightly slurred but no other neurologic changes. CT scan of the head showed equivocal high density abnormality involving third and fourth ventricles. Acute hemorrhage could be ruled out. Further evaluation with MRI showed acute hemorrhage involving the right superior cerebellar vermis with extension into fourth ventricle. There was also evidence of acute infarction involving the right medial thalamus and superior cerebellar peduncle. Atrophy as well as extensive microvascular ischemic changes as noted. Lacunar infarctions were noted in the lower midbrain on the right and left as well as and peri-ventricular white matter. Gradient sequences demonstrate numerous right cerebellar, bilateral thalamic and superior temporal foci of hemorrhage, indicating possible underlying amyloid angiopathy. Patient's symptoms of vertigo improved after arriving in the emergency room. Patient was not a TPA candidate secondary to hemorrhage. She was admitted to the neuro ICU for further evaluation and treatment.  SUBJECTIVE No family  is at the bedside.  Overall she feels her condition is stable. She complains she has not eaten. She admits she has history of hypertension but has not been taking any medicines. She denies any prior history of strokes, TIAs or significant neurological problems even though her CT scan and MRI show multiple lacunar infarcts.  OBJECTIVE Most recent Vital Signs: Filed Vitals:   07/02/11 0545 07/02/11 0600 07/02/11 0700  07/02/11 0759  BP: 156/85 157/88 169/97   Pulse:  59 59   Temp:    98.1 F (36.7 C)  TempSrc:    Oral  Resp:  11 9   Height:      Weight:      SpO2:  99% 99%    CBG (last 3)  Basename 07/01/11 1412  GLUCAP 147*   Intake/Output from previous day: 06/03 0701 - 06/04 0700 In: 938.3 [I.V.:926.3; IV Piggyback:12] Out: 900 [Urine:800; Emesis/NG output:100]  IV Fluid Intake:     . sodium chloride 75 mL/hr at 07/02/11 0526   MEDICATIONS    . pantoprazole (PROTONIX) IV  40 mg Intravenous QHS  . potassium chloride  40 mEq Oral Q4H  . potassium chloride      . potassium chloride      . senna-docusate  1 tablet Oral BID  . sodium chloride  1,000 mL Intravenous Once  . DISCONTD: heparin  5,000 Units Subcutaneous Q8H   PRN:  acetaminophen, acetaminophen, labetalol, morphine injection, ondansetron (ZOFRAN) IV, senna-docusate, traMADol, DISCONTD: labetalol  Diet:  NPO Activity:  Bedrest DVT Prophylaxis:  SCDs   CLINICALLY SIGNIFICANT STUDIES Basic Metabolic Panel:  Lab 07/02/11 4098 07/01/11 1746 07/01/11 1401 07/01/11 1250  NA 136 -- 140 --  K 4.3 -- 3.0* --  CL 100 -- 104 --  CO2 23 -- -- 23  GLUCOSE 130* -- 162* --  BUN 14 -- 17 --  CREATININE 0.81 0.79 -- --  CALCIUM 8.1* -- -- 9.1  MG -- -- -- --  PHOS -- -- -- --   Liver Function Tests:  Lab 07/01/11 1342  AST 44*  ALT 30  ALKPHOS 43  BILITOT 0.3  PROT 7.4  ALBUMIN 4.0   CBC:  Lab 07/02/11 0211 07/01/11 1746 07/01/11 1250  WBC 6.7 8.0 --  NEUTROABS -- -- 3.7  HGB 10.1* 10.9* --  HCT 27.6* 30.1* --  MCV 83.9 84.6 --  PLT 139* 160 --   Coagulation:  Lab 07/01/11 1341  LABPROT 13.4  INR 1.00   Cardiac Enzymes:  Lab 07/02/11 0212 07/01/11 1746 07/01/11 1355 07/01/11 1255  CKTOTAL 982* 1074* 1045* --  CKMB 9.7* 11.8* 12.1* --  CKMBINDEX -- -- -- --  TROPONINI <0.30 <0.30 -- <0.30   Urinalysis:  Lab 07/01/11 1318  COLORURINE YELLOW  LABSPEC 1.012  PHURINE 7.0  GLUCOSEU NEGATIVE  HGBUR NEGATIVE    BILIRUBINUR NEGATIVE  KETONESUR NEGATIVE  PROTEINUR 30*  UROBILINOGEN 0.2  NITRITE NEGATIVE  LEUKOCYTESUR TRACE*   Lipid Panel    Component Value Date/Time   CHOL 329* 07/02/2011 0211   TRIG 52 07/02/2011 0211   HDL 111 07/02/2011 0211   CHOLHDL 3.0 07/02/2011 0211   VLDL 10 07/02/2011 0211   LDLCALC 208* 07/02/2011 0211   HgbA1C  Lab Results  Component Value Date   HGBA1C 6.2* 07/01/2011    Urine Drug Screen:     Component Value Date/Time   LABOPIA NONE DETECTED 07/01/2011 1336   COCAINSCRNUR NONE DETECTED 07/01/2011 1336   LABBENZ NONE DETECTED 07/01/2011 1336   AMPHETMU NONE DETECTED 07/01/2011 1336   THCU NONE DETECTED 07/01/2011 1336   LABBARB NONE DETECTED 07/01/2011 1336    Alcohol Level: No results found for this basename: ETH:2 in the last 168 hours  Thyroid Function Tests:  Lab 07/01/11 1746  TSH 41.678*  T4TOTAL --  FREET4 --  T3FREE --  THYROIDAB --   Anemia Panel:  Lab 07/01/11 1746  VITAMINB12 511  FOLATE 17.5  FERRITIN 90  TIBC 378  IRON 43  RETICCTPCT 1.4   CT of the brain  07/01/2011  1. A small amount of acute intraventricular hemorrhage cannot be excluded in the third and fourth ventricles.  Alternatively, this high density could reflect prominent choroid plexus.  Further evaluation of the brain with and without contrast is suggested. Critical Value/emergent results were called by telephone at the time of interpretation on 07/01/2011  at 3:05 p.m.  to  Dr. Fonnie Jarvis, who verbally acknowledged these results. 2.  Chronic microvascular ischemic changes.  MRI of the brain  07/01/2011  9 x 11 mm acute hemorrhage involves the right superior vermis with intraventricular extension; small foci of acute infarction also noted in the right medial thalamus and superior cerebellar peduncle.  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 There is no subarachnoid blood to suggest  posterior circulation aneurysm.  A periventricular cavernoma which has acutely expanded could  have this appearance although I have no previous imaging for comparison; follow-up scanning recommended.  Multiple foci of chronic infarction and chronic hemorrhage are also noted bilaterally as described above.  Findings discussed with EDP.   MRA of the brain    2D Echocardiogram    Carotid Doppler    CXR  07/01/2011   1.  Probable left pleural effusion and left lower lung infiltrate or atelectasis. 2.  Mild cardiomegaly.   EKG  normal sinus rhythm.   Therapy Recommendations PT -, OT -  Physical Exam   Pleasant middle-aged [place] American lady currently not in distress.Awake alert. Afebrile. Head is nontraumatic. Neck is supple without bruit. Hearing is normal. Cardiac exam no murmur or gallop. Lungs are clear to auscultation. Distal pulses are well felt.  Neurological Exam ;Awake  Alert oriented x 3. Normal speech and language.eye movements full without nystagmus. Visual acuity is adequate. Fundi were not visualized. Follows commands very well. Face symmetric. Tongue midline. Normal strength, tone, reflexes and coordination. Normal sensation. Gait deferred.   ASSESSMENT [name] is a 66 y.o. female with an acute right superior vermis hemorrhage with intraventricular extension.  Hemorrhage secondary to hypertension. 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 On no antiplatelets prior to admission. Now on no antiplatelets for secondary stroke prevention. Patient with resultant left leg hemiparesis that is improving, dizziness has resolved.  -malignant hypertension, not on medications prior to admission, BP 206/104  -hyperlipidemia, LDL 208 -hyperglycemia, HgbA1c 6.2 -elevated cardiac enzymes  Hospital day # 1  TREATMENT/PLAN RECOMMENDATIONS -Keep in bed today. Ok to be OOB tomorrow with PT/OT evals -check swallow. Start diet if passes swallow -lisinopril for oral  BP control and IV labetalol when necessary for systolic blood pressure goal below 180. -TCD to look at vasculature (based on ASA guidelines, all stroke patients need vascular imaging. As she has already had a MRI this admission, and do not need to look for aneurysms or other cause of hemorrhage, recommend TCD to meet this guideline) -Discussed with patient and Dr. Dorthula Rue This patient is critically ill and at significant risk of neurological worsening, death and care requires constant monitoring of vital signs, hemodynamics,respiratory and cardiac monitoring,review of multiple databases, neurological assessment, discussion with family, other specialists and medical decision making of high complexity. I spent 30 minutes of neurocritical care time  in the care of  this patient.  Joaquin Music, ANP-BC, GNP-BC [facility] Pager: [phone] 07/02/2011 8:10 AM  Scribe for Dr. Delia Heady, Stroke Center Medical Director. He has personally reviewed chart, pertinent data, examined the patient and developed the plan of care. Pager:  [phone]

## 2011-07-02 NOTE — Progress Notes (Signed)
Medical Student Daily Progress Note 66 yo female with a PMH of uncontrolled hypertension presented to the ED with dizziness. She also complained of nausea. There was no LOC, weakness, vomiting, or headache. No alleviating factors, but movement exacerbated her dizziness  Subjective: Pt was resting quietly in bed this AM. She had no complaints, and denied N/V, headache, blurry vision, or dizziness.   Objective: Vital signs in last 24 hours: Filed Vitals:   07/02/11 0900 07/02/11 0935 07/02/11 1000 07/02/11 1025  BP: 171/96 168/91 170/95 170/94  Pulse: 60 56 57 57  Temp:      TempSrc:      Resp: 10 9 10 11   Height:      Weight:      SpO2: 95% 94% 95% 96%   Weight change:   Intake/Output Summary (Last 24 hours) at 07/02/11 1105 Last data filed at 07/02/11 1039  Gross per 24 hour  Intake 1213.25 ml  Output    900 ml  Net 313.25 ml   Physical Exam: General: resting in bed, NAD, slow speech  HEENT: PERRL, EOMI, no scleral icterus  Cardiac: RRR, no rubs, murmurs or gallops  Pulm: clear to auscultation bilaterally, moving normal volumes of air  Abd: soft, nontender, nondistended, BS present  Ext: warm and well perfused, no pedal edema  Neuro: alert and oriented X3, cranial nerves II-XII grossly intact  Lab Results: Basic Metabolic Panel:  Lab 07/02/11 1610 07/01/11 1746 07/01/11 1401 07/01/11 1250  NA 136 -- 140 --  K 4.3 -- 3.0* --  CL 100 -- 104 --  CO2 23 -- -- 23  GLUCOSE 130* -- 162* --  BUN 14 -- 17 --  CREATININE 0.81 0.79 -- --  CALCIUM 8.1* -- -- 9.1  MG -- -- -- --  PHOS -- -- -- --   Liver Function Tests:  Lab 07/01/11 1342  AST 44*  ALT 30  ALKPHOS 43  BILITOT 0.3  PROT 7.4  ALBUMIN 4.0   No results found for this basename: LIPASE:2,AMYLASE:2 in the last 168 hours No results found for this basename: AMMONIA:2 in the last 168 hours CBC:  Lab 07/02/11 0211 07/01/11 1746 07/01/11 1250  WBC 6.7 8.0 --  NEUTROABS -- -- 3.7  HGB 10.1* 10.9* --  HCT  27.6* 30.1* --  MCV 83.9 84.6 --  PLT 139* 160 --   Cardiac Enzymes:  Lab 07/02/11 0914 07/02/11 0212 07/01/11 1746  CKTOTAL 969* 982* 1074*  CKMB 9.9* 9.7* 11.8*  CKMBINDEX -- -- --  TROPONINI <0.30 <0.30 <0.30   BNP: No results found for this basename: PROBNP:3 in the last 168 hours D-Dimer: No results found for this basename: DDIMER:2 in the last 168 hours CBG:  Lab 07/01/11 1412  GLUCAP 147*   Hemoglobin A1C:  Lab 07/01/11 1746  HGBA1C 6.2*   Fasting Lipid Panel:  Lab 07/02/11 0211  CHOL 329*  HDL 111  LDLCALC 208*  TRIG 52  CHOLHDL 3.0  LDLDIRECT --   Thyroid Function Tests:  Lab 07/01/11 1746  TSH 41.678*  T4TOTAL --  FREET4 --  T3FREE --  THYROIDAB --   Coagulation:  Lab 07/01/11 1341  LABPROT 13.4  INR 1.00   Anemia Panel:  Lab 07/01/11 1746  VITAMINB12 511  FOLATE 17.5  FERRITIN 90  TIBC 378  IRON 43  RETICCTPCT 1.4   Urine Drug Screen: Drugs of Abuse     Component Value Date/Time   LABOPIA NONE DETECTED 07/01/2011 1336   COCAINSCRNUR NONE  DETECTED 07/01/2011 1336   LABBENZ NONE DETECTED 07/01/2011 1336   AMPHETMU NONE DETECTED 07/01/2011 1336   THCU NONE DETECTED 07/01/2011 1336   LABBARB NONE DETECTED 07/01/2011 1336    Urinalysis:  Lab 07/01/11 1318  COLORURINE YELLOW  LABSPEC 1.012  PHURINE 7.0  GLUCOSEU NEGATIVE  HGBUR NEGATIVE  BILIRUBINUR NEGATIVE  KETONESUR NEGATIVE  PROTEINUR 30*  UROBILINOGEN 0.2  NITRITE NEGATIVE  LEUKOCYTESUR TRACE*    Micro Results: Recent Results (from the past 240 hour(s))  MRSA PCR SCREENING     Status: Normal   Collection Time   07/01/11  7:58 PM      Component Value Range Status Comment   MRSA by PCR NEGATIVE  NEGATIVE  Final    Studies/Results: Ct Head Wo Contrast  07/02/2011  *RADIOLOGY REPORT*  Clinical Data: Intracranial hemorrhage follow-up.  CT HEAD WITHOUT CONTRAST  Technique:  Contiguous axial images were obtained from the base of the skull through the vertex without contrast.   Comparison: 07/01/2011 MR brain and CT.  Findings: Hyperdense material centered at the right superior cerebellar peduncle/fourth ventricle once again noted.  Question if this represents hemorrhage into a cavernoma?  Slight decrease density of blood noted within the aqueduct extending into the third ventricle.  Multiple remote infarcts and small vessel disease type changes as noted on MR.  The MR questioned new small acute infarct of the medial right thalamus and the superior cerebral peduncle are not as well appreciated on the present CT.  IMPRESSION: Hyperdense material centered at the right superior cerebellar peduncle/fourth ventricle once again noted.  Question if this represents hemorrhage into a cavernoma as versus hemorrhagic infarct as suggested on recent MR report?  Slight decrease density of blood noted within the aqueduct extending into the third ventricle.  Multiple remote infarcts and small vessel disease type changes as noted on MR.  The MR questioned new small medial right thalamus is not as well appreciated on the present CT.  Original Report Authenticated By: Fuller Canada, M.D.   Ct Head Wo Contrast  07/01/2011  *RADIOLOGY REPORT*  Clinical Data: Near syncope  CT HEAD WITHOUT CONTRAST  Technique:  Contiguous axial images were obtained from the base of the skull through the vertex without contrast.  Comparison: None.  Findings: There is asymmetric curvilinear high density along the right aspect of the fourth ventricle on image number 13.  Linear high density projects within the central portion of the fourth ventricle and third ventricle on images 12 and 13.  This finding could reflect benign prominent choroid plexus, however a small amount of acute hemorrhage is difficult to exclude.  There are no prior studies for comparison.  There is patchy hypodensity in the periventricular white matter bilaterally suggesting chronic microvascular ischemic changes.  The ventricles are normal in size.  No  evidence of acute cortically based infarction.  Negative for mass effect or midline shift.  The visualized paranasal sinuses, mastoid air cells, and middle ears are clear.  The mastoid air cells are clear.  The skull is intact.  Soft tissues of the scalp are symmetric.  IMPRESSION: 1. A small amount of acute intraventricular hemorrhage cannot be excluded in the third and fourth ventricles.  Alternatively, this high density could reflect prominent choroid plexus.  Further evaluation of the brain with and without contrast is suggested. Critical Value/emergent results were called by telephone at the time of interpretation on 07/01/2011  at 3:05 p.m.  to  Dr. Fonnie Jarvis, who verbally acknowledged these results. 2.  Chronic microvascular ischemic changes.  Original Report Authenticated By: Britta Mccreedy, M.D.   Mr Brain Wo Contrast  07/01/2011  *RADIOLOGY REPORT*  Clinical Data: Dizzy with nausea.  Hypertension.  MRI HEAD WITHOUT CONTRAST  Technique:  Multiplanar, multiecho pulse sequences of the brain and surrounding structures were obtained according to standard protocol without intravenous contrast.  Comparison: 07/01/2011 earlier today.  Findings:  There is an acute 9 x 11 mm cross-section hemorrhage involving the right superior vermis/right superior cerebellar peduncle/dorsolateral recess of the fourth ventricle with intraventricular extension. There is some extension into the aqueduct and posterior third ventricle.  No subarachnoid blood. There is a 5 mm area of restricted diffusion immediately adjacent to the hemorrhage suggesting superimposed acute infarction. A 2 mm area of restricted diffusion affects the right thalamus suggesting a second area of acute infarction.  There is atrophy with extensive chronic microvascular ischemic change.  Gradient sequences demonstrate numerous right cerebellar, bilateral thalamic, and right superior temporal foci of hemorrhage representing either hypertensive cerebrovascular  disease or cerebral amyloid angiopathy. Large lacunar infarcts are noted in the lower midbrain on the right and left as well as in the periventricular white matter.  Major intracranial vascular structures patent.  Chronic sinus disease affects the left maxillary region.  Compared with the prior CT, the appearance is similar.  IMPRESSION: 9 x 11 mm acute hemorrhage involves the right superior vermis with intraventricular extension; small foci of acute infarction also noted in the right medial thalamus and superior cerebellar peduncle.  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 There is no subarachnoid blood to suggest  posterior circulation aneurysm.  A periventricular cavernoma which has acutely expanded could have this appearance although I have no previous imaging for comparison; follow-up scanning recommended.  Multiple foci of chronic infarction and chronic hemorrhage are also noted bilaterally as described above.  Findings discussed with EDP.  Original Report Authenticated By: Elsie Stain, M.D.   Dg Chest Port 1 View  07/01/2011  *RADIOLOGY REPORT*  Clinical Data: Dizzy, nausea  PORTABLE CHEST - 1 VIEW  Comparison: None.  Findings: Mild cardiomegaly.  Suspect left pleural effusion. Atelectasis or infiltrate at the left lung base.  Low lung volumes with resultant crowding of bronchovascular structures.  Regional bones unremarkable.  IMPRESSION:  1.  Probable left pleural effusion and left lower lung infiltrate or atelectasis. 2.  Mild cardiomegaly.  Original Report Authenticated By: Osa Craver, M.D.   Medications: I have reviewed the patient's current medications. Scheduled Meds:   . amLODipine  5 mg Oral Daily  . atorvastatin  20 mg Oral q1800  . levothyroxine  25 mcg Oral QAC breakfast  . lisinopril  10 mg Oral Daily  . pantoprazole (PROTONIX) IV  40 mg Intravenous QHS  . potassium chloride  40 mEq Oral Q4H  . potassium chloride      . potassium chloride      .  senna-docusate  1 tablet Oral BID  . sodium chloride  1,000 mL Intravenous Once  . DISCONTD: heparin  5,000 Units Subcutaneous Q8H   Continuous Infusions:   . sodium chloride 75 mL/hr at 07/02/11 1039   PRN Meds:.acetaminophen, acetaminophen, labetalol, morphine injection, ondansetron (ZOFRAN) IV, senna-docusate, traMADol, DISCONTD: labetalol Assessment/Plan:  Hypertensive Emergency - causing intracranial hemorrhage Pt presented to ED with BP of 216/115. Her chronic uncontrolled hypertension resulted in intracranial hemorrhage causing the dizziness. CT/MRI showed small acute hemorrhage as well as evidence of past hemorrhages.   - Control BP: Amlodipine and Lisinopril -  Educated patient on consequences of high BP - Repeat CT w/o contrast tomorrow AM - Carotid Duplex - 2D Echo - Physical Therapy/Occupational Therapy  Hyperlipidemia In order to prevent MI or stroke, pt was told to adhere to medications and change to a healthy diet.  - Lipitor (Atorvastatin)  S/p Radioactive therapy for hyperthyroidism - causing Hypothyroidism Pt was prescribed to be taking synthroid after her radioactive therapy but has not been on any medication.  -Synthroid 25mg  -Follow up in [facility]  Elevated Cardiac Enzymes  Troponins are negative and CK MB and total CK are increased indicating cardiac muscle injury likely 2/2 hypertensive emergency. Enzymes are trending down. MI r/o Tropinins are negative x 3  Anemia  Anemia can be secondary to GI loss vs. Iron deficiency anemia   -Ferrous Sulfate  -Stool for occult blood/Rectal Exam   Hyperglycemia  Pt has no history of diabetes but a positive family history. HbA1c of 6.2   -No immediate treatment - will treat during [facility] appointment   Proteinuria  Possibly due to hypertensive emergency causing glomerular damage.   -urine dipstick   Hypokalemia - resolved K Dur 40mg  PO X 2 was given to resolve hypokalemia    LOS: 1 day   This is a Advertising account executive Note.  The care of the patient was discussed with Dr. Dorthula Rue and the assessment and plan formulated with their assistance.  Please see their attached note for official documentation of the daily encounter.  Verlin Grills 07/02/2011, 11:05 AM  Resident Co-sign Daily Note: I have seen the patient and reviewed the daily progress note by Verlin Grills MS IV and discussed the care of the patient with them.  See below for documentation of my findings, assessment, and plans.  Subjective: She reports feeling better- dizziness is getting better. Denies nay complaints Objective: Vital signs in last 24 hours: Filed Vitals:   07/02/11 1000 07/02/11 1025 07/02/11 1100 07/02/11 1120  BP: 170/95 170/94 178/91 163/82  Pulse: 57 57 59 58  Temp:      TempSrc:      Resp: 10 11 14 12   Height:      Weight:      SpO2: 95% 96% 97% 94%   Physical Exam: BP 163/82  Pulse 58  Temp(Src) 98.1 F (36.7 C) (Oral)  Resp 12  Ht 5\' 1"  (1.549 m)  Wt 129 lb 13.6 oz (58.9 kg)  BMI 24.54 kg/m2  SpO2 94%  General Appearance:    Alert, cooperative, no distress, appears stated age  Head:    Normocephalic, without obvious abnormality, atraumatic  Eyes:    Left eye was puffy, conjunctiva/corneas clear, EOM's intact, fundi    benign, both eyes  Ears:    Normal TM's and external ear canals, both ears  Nose:   Nares normal, septum midline, mucosa normal, no drainage    or sinus tenderness  Throat:   Lips, mucosa, and tongue normal; teeth and gums normal  Neck:   Supple, symmetrical, trachea midline, no adenopathy;    thyroid:  no enlargement/tenderness/nodules; no carotid   bruit or JVD  Back:     Symmetric, no curvature, ROM normal, no CVA tenderness  Lungs:     Clear to auscultation bilaterally, respirations unlabored  Chest Wall:    No tenderness or deformity   Heart:    Regular rate and rhythm, S1 and S2 normal, no murmur, rub   or gallop  Breast Exam:    No tenderness, masses, or nipple abnormality  Abdomen:     Soft, non-tender, bowel sounds active all four quadrants,    no masses, no organomegaly  Genitalia:    Normal female without lesion, discharge or tenderness  Rectal:    Normal tone, normal prostate, no masses or tenderness;   guaiac negative stool  Extremities:   Extremities normal, atraumatic, no cyanosis or edema  Pulses:   2+ and symmetric all extremities  Skin:   Skin color, texture, turgor normal, no rashes or lesions  Lymph nodes:   Cervical, supraclavicular, and axillary nodes normal  Neurologic:   CNII-XII intact, normal strength, sensation and reflexes    throughout   Lab Results: Reviewed and documented in Electronic Record Micro Results: Reviewed and documented in Electronic Record Studies/Results: Reviewed and documented in Electronic Record Medications: I have reviewed the patient's current medications. Scheduled Meds:   . amLODipine  5 mg Oral Daily  . atorvastatin  20 mg Oral q1800  . levothyroxine  25 mcg Oral QAC breakfast  . lisinopril  10 mg Oral Daily  . pantoprazole (PROTONIX) IV  40 mg Intravenous QHS  . potassium chloride  40 mEq Oral Q4H  . potassium chloride      . potassium chloride      . senna-docusate  1 tablet Oral BID  . sodium chloride  1,000 mL Intravenous Once  . DISCONTD: heparin  5,000 Units Subcutaneous Q8H   Continuous Infusions:   . sodium chloride 75 mL/hr at 07/02/11 1039   PRN Meds:.acetaminophen, acetaminophen, labetalol, morphine injection, ondansetron (ZOFRAN) IV, senna-docusate, traMADol, DISCONTD: labetalol Assessment/Plan:  66 year old woman with past medical history significant for long-standing uncontrolled hypertension, on no medications and no healthcare followups for long time presents to th hospital with dizziness and was found to have a blood pressure of 216/115.  1. Hypertensive emergency: Patient presented with dizziness and blood pressure of 216/115 and was noted to have right superior cerebellar vermis  right medial thalamus infarcts on her MRI on 07/01/2011. She was also noted to have a elevated  CK-MB. EKG did not show any acute ST- T wave changes. Neurology consult was obtained and patient was started  Labetalol PRN for malignant HTN. Repeat CT scan today does not show any extension of her hemorrhage -  Appreciate neuro inputs! - Continue her on labetalol as needed. - ASA for primary stroke prevention - Start her on low dose lisinopril and amlodipine-likely she would need two agents to control her long-standing uncontrolled hypertension. - Start her new statin- for >200. - F/u on carotid dopplers, 2D echo and transcranial doppler - PT/OT consult.  2. Hypothyroidism( status post radioiodine ablation in 2007): TSH was elevated in 40s. -Her on Synthroid 25 mcg per day. Will titrate the dose outpatient in 4 weeks.  3. HLD: Elevated LDL to 208 - Start her on lipitor.  4. Pre- diabetes: AIC- 6.2. - Diet and exercise for now  5. Anemia: baseline not known , presented around 10.  Iron - 43, Ferritin 90, saturation -11% - start her on ferrous sulphate - FOBT stools  6. Dispo: Continue to observe in ICU    LOS: 1 day   Frona Yost 07/02/2011, 11:38 AM

## 2011-07-02 NOTE — Progress Notes (Addendum)
PT Cancellation Note  Treatment cancelled today due to medical issues with patient which prohibited therapy   (bedrest)  07/02/2011  [place] Bing, PT [phone] (pager)

## 2011-07-02 NOTE — Progress Notes (Signed)
VASCULAR LAB PRELIMINARY  PRELIMINARY  PRELIMINARY  PRELIMINARY  Carotid duplex completed.    Preliminary report:  Bilateral:  No evidence of hemodynamically significant internal carotid artery stenosis.   Vertebral artery flow is antegrade.     Renessa Wellnitz D, RVS 07/02/2011, 3:54 PM

## 2011-07-02 NOTE — Progress Notes (Signed)
OT Cancellation Note  Treatment cancelled today due to medical issues with patient which prohibited therapy (currently on Bedrest).  Harrel Carina Nelson OTR/L Pager: [phone]  07/02/2011, 9:55 AM

## 2011-07-02 NOTE — Evaluation (Signed)
Speech Language Pathology Evaluation Patient Details Name: Jacqueline King MRN: [id number] DOB: Nov 15, 1945 Today's Date: 07/02/2011 Time:  -     Problem List: There is no problem list on file for this patient.  Past Medical History:  Past Medical History  Diagnosis Date  . Hypertension   . H/O radioactive iodine thyroid ablation    Past Surgical History:  Past Surgical History  Procedure Date  . Abdominal hysterectomy   . Cholecystectomy   . Angioplasty 1993 or 1994   HPI:  Jacqueline King is an 66 y.o. female experienced acute onset of severe vertigo with associated nausea and near syncopal symptoms while at work 07/01/2011. She was noted to have nystagmus in all directions of gaze on arrival in the emergency room with nausea as well. No focal deficits were noted. Patient indicated her speech is slightly slurred but no other neurologic changes. CT scan of the head showed equivocal high density abnormality involving third and fourth ventricles. Acute hemorrhage could be ruled out. Further evaluation with MRI showed acute hemorrhage involving the right superior cerebellar vermis with extension into fourth ventricle. There was also evidence of acute infarction involving the right medial thalamus and superior cerebellar peduncle. Atrophy as well as extensive microvascular ischemic changes as noted. Lacunar infarctions were noted in the lower midbrain on the right and left as well as and peri-ventricular white matter. Gradient sequences demonstrate numerous right cerebellar, bilateral thalamic and superior temporal foci of hemorrhage, indicating possible underlying amyloid angiopathy. Patient's symptoms of vertigo improved after arriving in the emergency room.  She was admitted to the neuro ICU for further evaluation and treatment.  Dizziness better today per pt; currently on bedrest.   Assessment / Plan / Recommendation Clinical Impression  Pt presents with a mild ataxic dysarthria; language/cognitive  functions intact.  Will follow next 24-48 hours for changes and to determine if SLP f/u is warranted.    SLP Assessment   continued Speech Language Pathology Services TBA   Follow Up Recommendations  tba    Frequency and Duration min 1 x/week  1 week       SLP Goals  SLP Goals Potential to Achieve Goals: Good SLP Goal #1: Pt will utilize methods to improve speech clarity during conversation with mod I.   SLP Evaluation Prior Functioning  Cognitive/Linguistic Baseline: Within functional limits   Cognition  Overall Cognitive Status: Appears within functional limits for tasks assessed Arousal/Alertness: Awake/alert Orientation Level: Oriented X4 Attention: Sustained Memory: Appears intact Problem Solving:  (tba) Executive Function:  (tba) Safety/Judgment: Appears intact    Comprehension  Auditory Comprehension Overall Auditory Comprehension: Appears within functional limits for tasks assessed    Expression Expression Primary Mode of Expression: Verbal Verbal Expression Overall Verbal Expression: Appears within functional limits for tasks assessed   Oral / Motor Oral Motor/Sensory Function Overall Oral Motor/Sensory Function: Appears within functional limits for tasks assessed Motor Speech Overall Motor Speech: Impaired Articulation: Impaired Level of Impairment: [employer] Jacqueline King, [place] CCC/SLP Pager [phone]  Jacqueline King 07/02/2011, 11:20 AM

## 2011-07-03 DIAGNOSIS — I619 Nontraumatic intracerebral hemorrhage, unspecified: Secondary | ICD-10-CM

## 2011-07-03 DIAGNOSIS — I6330 Cerebral infarction due to thrombosis of unspecified cerebral artery: Secondary | ICD-10-CM

## 2011-07-03 LAB — CBC
MCV: 85.9 fL (ref 78.0–100.0)
Platelets: 146 10*3/uL — ABNORMAL LOW (ref 150–400)
RBC: 3.55 MIL/uL — ABNORMAL LOW (ref 3.87–5.11)
RDW: 15.2 % (ref 11.5–15.5)
WBC: 5.9 10*3/uL (ref 4.0–10.5)

## 2011-07-03 LAB — COMPREHENSIVE METABOLIC PANEL
Alkaline Phosphatase: 41 U/L (ref 39–117)
BUN: 15 mg/dL (ref 6–23)
CO2: 21 mEq/L (ref 19–32)
Chloride: 109 mEq/L (ref 96–112)
Creatinine, Ser: 0.89 mg/dL (ref 0.50–1.10)
GFR calc Af Amer: 77 mL/min — ABNORMAL LOW (ref >90)
GFR calc non Af Amer: 67 mL/min — ABNORMAL LOW (ref >90)
Glucose, Bld: 142 mg/dL — ABNORMAL HIGH (ref 70–99)
Potassium: 3.6 mEq/L (ref 3.5–5.1)
Total Bilirubin: 0.3 mg/dL (ref 0.3–1.2)

## 2011-07-03 MED ORDER — HYDROCHLOROTHIAZIDE 25 MG PO TABS
25.0000 mg | ORAL_TABLET | Freq: Every day | ORAL | Status: DC
  Administered 2011-07-03: 25 mg via ORAL
  Filled 2011-07-03 (×2): qty 1

## 2011-07-03 NOTE — Progress Notes (Signed)
SPEECH/LANGUAGE PATHOLOGY  Order rec'd to discontinue SLP.  If dysarthria, though mild, persists and pt would like f/u, please refer for OPSLP services at discharge. Thank you, Fay Swider L. Samson Frederic, [place] CCC/SLP Pager [phone]

## 2011-07-03 NOTE — Progress Notes (Signed)
Pt received from 3100. Pt stable; Alert and oriented to room. Bed low and call light within reach. [place], Rosanna Randy

## 2011-07-03 NOTE — Progress Notes (Signed)
Medical Student Daily Progress Note 66 yo female with a PMH of uncontrolled hypertension presented to the ED with dizziness. She also complained of nausea. There was no LOC, weakness, vomiting, or headache. No alleviating factors, but movement exacerbated her dizziness  Subjective: Pt was resting quietly in bed this AM. She has complaints of minimal dizziness, but denies HA, nausea, vomiting, cough, or pain. Objective: Vital signs in last 24 hours: Filed Vitals:   07/03/11 0300 07/03/11 0400 07/03/11 0500 07/03/11 0600  BP: 133/63 142/69 136/71 160/86  Pulse: 57 57 57 58  Temp:  98.9 F (37.2 C)    TempSrc:  Oral    Resp: 19 10 11 9   Height:      Weight:      SpO2: 96% 96% 97% 97%   Weight change:   Intake/Output Summary (Last 24 hours) at 07/03/11 1610 Last data filed at 07/03/11 0600  Gross per 24 hour  Intake 1826.25 ml  Output   1100 ml  Net 726.25 ml   Physical Exam: General: resting in bed, NAD  HEENT: PERRL, EOMI, no scleral icterus  Cardiac: RRR, no rubs, murmurs or gallops  Pulm: decreased breath sounds b/l on lung bases  Abd: soft, nontender, nondistended, BS present  Ext: warm and well perfused, no pedal edema  Neuro: alert and oriented X3, cranial nerves II-XII grossly intact  Lab Results: Basic Metabolic Panel:  Lab 07/02/11 9604 07/01/11 1746 07/01/11 1401 07/01/11 1250  NA 136 -- 140 --  K 4.3 -- 3.0* --  CL 100 -- 104 --  CO2 23 -- -- 23  GLUCOSE 130* -- 162* --  BUN 14 -- 17 --  CREATININE 0.81 0.79 -- --  CALCIUM 8.1* -- -- 9.1  MG -- -- -- --  PHOS -- -- -- --   CBC:  Lab 07/02/11 0211 07/01/11 1746 07/01/11 1250  WBC 6.7 8.0 --  NEUTROABS -- -- 3.7  HGB 10.1* 10.9* --  HCT 27.6* 30.1* --  MCV 83.9 84.6 --  PLT 139* 160 --   Cardiac Enzymes:  Lab 07/02/11 0914 07/02/11 0212 07/01/11 1746  CKTOTAL 969* 982* 1074*  CKMB 9.9* 9.7* 11.8*  CKMBINDEX -- -- --  TROPONINI <0.30 <0.30 <0.30   CBG:  Lab 07/01/11 1412  GLUCAP 147*    Hemoglobin A1C:  Lab 07/01/11 1746  HGBA1C 6.2*   Fasting Lipid Panel:  Lab 07/02/11 0211  CHOL 329*  HDL 111  LDLCALC 208*  TRIG 52  CHOLHDL 3.0  LDLDIRECT --   Thyroid Function Tests:  Lab 07/01/11 1746  TSH 41.678*  T4TOTAL --  FREET4 --  T3FREE --  THYROIDAB --   Coagulation:  Lab 07/01/11 1341  LABPROT 13.4  INR 1.00   Anemia Panel:  Lab 07/01/11 1746  VITAMINB12 511  FOLATE 17.5  FERRITIN 90  TIBC 378  IRON 43  RETICCTPCT 1.4   Urine Drug Screen: Drugs of Abuse     Component Value Date/Time   LABOPIA NONE DETECTED 07/01/2011 1336   COCAINSCRNUR NONE DETECTED 07/01/2011 1336   LABBENZ NONE DETECTED 07/01/2011 1336   AMPHETMU NONE DETECTED 07/01/2011 1336   THCU NONE DETECTED 07/01/2011 1336   LABBARB NONE DETECTED 07/01/2011 1336    Alcohol Level: No results found for this basename: ETH:2 in the last 168 hours Urinalysis:  Lab 07/01/11 1318  COLORURINE YELLOW  LABSPEC 1.012  PHURINE 7.0  GLUCOSEU NEGATIVE  HGBUR NEGATIVE  BILIRUBINUR NEGATIVE  KETONESUR NEGATIVE  PROTEINUR 30*  UROBILINOGEN  0.2  NITRITE NEGATIVE  LEUKOCYTESUR TRACE*    Micro Results: Recent Results (from the past 240 hour(s))  MRSA PCR SCREENING     Status: Normal   Collection Time   07/01/11  7:58 PM      Component Value Range Status Comment   MRSA by PCR NEGATIVE  NEGATIVE  Final    Studies/Results: Ct Head Wo Contrast  07/02/2011  *RADIOLOGY REPORT*  Clinical Data: Intracranial hemorrhage follow-up.  CT HEAD WITHOUT CONTRAST  Technique:  Contiguous axial images were obtained from the base of the skull through the vertex without contrast.  Comparison: 07/01/2011 MR brain and CT.  Findings: Hyperdense material centered at the right superior cerebellar peduncle/fourth ventricle once again noted.  Question if this represents hemorrhage into a cavernoma?  Slight decrease density of blood noted within the aqueduct extending into the third ventricle.  Multiple remote infarcts and  small vessel disease type changes as noted on MR.  The MR questioned new small acute infarct of the medial right thalamus and the superior cerebral peduncle are not as well appreciated on the present CT.  IMPRESSION: Hyperdense material centered at the right superior cerebellar peduncle/fourth ventricle once again noted.  Question if this represents hemorrhage into a cavernoma as versus hemorrhagic infarct as suggested on recent MR report?  Slight decrease density of blood noted within the aqueduct extending into the third ventricle.  Multiple remote infarcts and small vessel disease type changes as noted on MR.  The MR questioned new small medial right thalamus is not as well appreciated on the present CT.  Original Report Authenticated By: Fuller Canada, M.D.   Ct Head Wo Contrast  07/01/2011  *RADIOLOGY REPORT*  Clinical Data: Near syncope  CT HEAD WITHOUT CONTRAST  Technique:  Contiguous axial images were obtained from the base of the skull through the vertex without contrast.  Comparison: None.  Findings: There is asymmetric curvilinear high density along the right aspect of the fourth ventricle on image number 13.  Linear high density projects within the central portion of the fourth ventricle and third ventricle on images 12 and 13.  This finding could reflect benign prominent choroid plexus, however a small amount of acute hemorrhage is difficult to exclude.  There are no prior studies for comparison.  There is patchy hypodensity in the periventricular white matter bilaterally suggesting chronic microvascular ischemic changes.  The ventricles are normal in size.  No evidence of acute cortically based infarction.  Negative for mass effect or midline shift.  The visualized paranasal sinuses, mastoid air cells, and middle ears are clear.  The mastoid air cells are clear.  The skull is intact.  Soft tissues of the scalp are symmetric.  IMPRESSION: 1. A small amount of acute intraventricular hemorrhage cannot  be excluded in the third and fourth ventricles.  Alternatively, this high density could reflect prominent choroid plexus.  Further evaluation of the brain with and without contrast is suggested. Critical Value/emergent results were called by telephone at the time of interpretation on 07/01/2011  at 3:05 p.m.  to  Dr. Fonnie Jarvis, who verbally acknowledged these results. 2.  Chronic microvascular ischemic changes.  Original Report Authenticated By: Britta Mccreedy, M.D.   Mr Brain Wo Contrast  07/01/2011  *RADIOLOGY REPORT*  Clinical Data: Dizzy with nausea.  Hypertension.  MRI HEAD WITHOUT CONTRAST  Technique:  Multiplanar, multiecho pulse sequences of the brain and surrounding structures were obtained according to standard protocol without intravenous contrast.  Comparison: 07/01/2011 earlier today.  Findings:  There is an acute 9 x 11 mm cross-section hemorrhage involving the right superior vermis/right superior cerebellar peduncle/dorsolateral recess of the fourth ventricle with intraventricular extension. There is some extension into the aqueduct and posterior third ventricle.  No subarachnoid blood. There is a 5 mm area of restricted diffusion immediately adjacent to the hemorrhage suggesting superimposed acute infarction. A 2 mm area of restricted diffusion affects the right thalamus suggesting a second area of acute infarction.  There is atrophy with extensive chronic microvascular ischemic change.  Gradient sequences demonstrate numerous right cerebellar, bilateral thalamic, and right superior temporal foci of hemorrhage representing either hypertensive cerebrovascular disease or cerebral amyloid angiopathy. Large lacunar infarcts are noted in the lower midbrain on the right and left as well as in the periventricular white matter.  Major intracranial vascular structures patent.  Chronic sinus disease affects the left maxillary region.  Compared with the prior CT, the appearance is similar.  IMPRESSION: 9 x 11 mm  acute hemorrhage involves the right superior vermis with intraventricular extension; small foci of acute infarction also noted in the right medial thalamus and superior cerebellar peduncle.  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 There is no subarachnoid blood to suggest  posterior circulation aneurysm.  A periventricular cavernoma which has acutely expanded could have this appearance although I have no previous imaging for comparison; follow-up scanning recommended.  Multiple foci of chronic infarction and chronic hemorrhage are also noted bilaterally as described above.  Findings discussed with EDP.  Original Report Authenticated By: Elsie Stain, M.D.   Dg Chest Port 1 View  07/01/2011  *RADIOLOGY REPORT*  Clinical Data: Dizzy, nausea  PORTABLE CHEST - 1 VIEW  Comparison: None.  Findings: Mild cardiomegaly.  Suspect left pleural effusion. Atelectasis or infiltrate at the left lung base.  Low lung volumes with resultant crowding of bronchovascular structures.  Regional bones unremarkable.  IMPRESSION:  1.  Probable left pleural effusion and left lower lung infiltrate or atelectasis. 2.  Mild cardiomegaly.  Original Report Authenticated By: Osa Craver, M.D.   Medications: I have reviewed the patient's current medications. Scheduled Meds:   . amLODipine  5 mg Oral Daily  . atorvastatin  20 mg Oral q1800  . levothyroxine  25 mcg Oral QAC breakfast  . lisinopril  10 mg Oral Daily  . pantoprazole  40 mg Oral QHS  . potassium chloride      . potassium chloride      . senna-docusate  1 tablet Oral BID  . DISCONTD: pantoprazole (PROTONIX) IV  40 mg Intravenous QHS   Continuous Infusions:   . sodium chloride 75 mL/hr at 07/02/11 2000   PRN Meds:.acetaminophen, acetaminophen, labetalol, morphine injection, ondansetron (ZOFRAN) IV, senna-docusate, traMADol   Assessment/Plan: Hypertensive Emergency - causing intracranial hemorrhage  Pt presented to ED with BP of  216/115. Her chronic uncontrolled hypertension resulted in intracranial hemorrhage causing the dizziness. CT/MRI showed small acute hemorrhage as well as evidence of past infarcts. Morning BP 160/86. Carotid duplex showed no significant internal carotid artery stenosis.   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  -D/C lisinporil, started HCTZ for BP control. -will attempt to  find past medical records regarding the angioplasty  Pleural Effusion and Infiltrate vs. Atelectasis As per radiology report, there is a probable left pleural effusion and left lung infiltrate or atelectasis.   -repeat chest X-ray tomorrow to observe any changes and differentiate between infiltrate vs. Atelectasis.  Hyperlipidemia  In order to prevent MI or stroke, pt was told to adhere to medications and change to a healthy diet.  - Lipitor (Atorvastatin)   S/p Radioactive therapy for hyperthyroidism - causing Hypothyroidism  Pt had TSH of 41.7  -Synthroid 25mg   -Follow up in OPC   Anemia  Anemia can be secondary to GI loss vs. Iron deficiency anemia   -Ferrous Sulfate  -Stool for occult blood/Rectal Exam - waiting on result   Hyperglycemia  Pt has no history of diabetes but a positive family history. HbA1c of 6.2   -No immediate treatment - will treat during [facility] appointment   Proteinuria  Possibly due to hypertensive emergency causing glomerular damage.   -urine dipstick   Mild Cardiomegaly 2D echo was done and showed mild left ventricular hypertrophy with an EF of 55-60%. Mild mitral valve regurg and a elevated  mean left atrial filling pressure.  Elevated Cardiac Enzymes  Troponins are negative and CK MB and total CK are increased indicating cardiac muscle injury likely 2/2 hypertensive emergency. Enzymes are trending down. MI r/o Tropinins are negative x 3.   Hypokalemia - resolved  K Dur 40mg  PO X 2 was given to resolve hypokalemia  Disposition - Patient with get PT/OT today and likely will be sent to neuro tele    LOS: 2 days   This is a Psychologist, occupational Note.  The care of the patient was discussed with Dr. Dorthula Rue and the assessment and plan formulated with their assistance.  Please see their attached note for official documentation of the daily encounter.  Verlin Grills 07/03/2011, 7:02 AM

## 2011-07-03 NOTE — Progress Notes (Signed)
Stroke Team Progress Note  HISTORY Jacqueline King is an 66 y.o. female experienced acute onset of severe vertigo with associated nausea and near syncopal symptoms while at work early this afternoon 07/01/2011. She has not experienced diplopia. She had no focal weakness or numbness. She also has not experienced headache. She was noted to have nystagmus in all directions of gaze on arrival in the emergency room with nausea as well. No focal deficits were noted. Patient indicated her speech is slightly slurred but no other neurologic changes. CT scan of the head showed equivocal high density abnormality involving third and fourth ventricles. Acute hemorrhage could be ruled out. Further evaluation with MRI showed acute hemorrhage involving the right superior cerebellar vermis with extension into fourth ventricle. There was also evidence of acute infarction involving the right medial thalamus and superior cerebellar peduncle. Atrophy as well as extensive microvascular ischemic changes as noted. Lacunar infarctions were noted in the lower midbrain on the right and left as well as and peri-ventricular white matter. Gradient sequences demonstrate numerous right cerebellar, bilateral thalamic and superior temporal foci of hemorrhage, indicating possible underlying amyloid angiopathy. Patient's symptoms of vertigo improved after arriving in the emergency room. Patient was not a TPA candidate secondary to hemorrhage. She was admitted to the neuro ICU for further evaluation and treatment.  SUBJECTIVE Patient's son at bedside. Patient feels well, except for intermittent side-side dizziness and blurred vision. Has not seen PCP since 2008. Was not taking her rx'd meds at home. No HA or nausea.  OBJECTIVE Most recent Vital Signs: Filed Vitals:   07/03/11 0700 07/03/11 0800 07/03/11 0900 07/03/11 0926  BP: 162/77 176/88 168/90 168/90  Pulse: 57 57 59   Temp:      TempSrc:      Resp: 13 12 16    Height:      Weight:        SpO2: 98% 97% 95%    CBG (last 3)   Basename 07/01/11 1412  GLUCAP 147*   Intake/Output from previous day: 06/04 0701 - 06/05 0700 In: 1901.3 [P.O.:150; I.V.:1751.3] Out: 1100 [Urine:1100]  IV Fluid Intake:      . DISCONTD: sodium chloride Stopped (07/03/11 0837)   MEDICATIONS     . amLODipine  5 mg Oral Daily  . atorvastatin  20 mg Oral q1800  . levothyroxine  25 mcg Oral QAC breakfast  . pantoprazole  40 mg Oral QHS  . potassium chloride      . senna-docusate  1 tablet Oral BID  . DISCONTD: lisinopril  10 mg Oral Daily  . DISCONTD: pantoprazole (PROTONIX) IV  40 mg Intravenous QHS   PRN:  acetaminophen, acetaminophen, labetalol, morphine injection, ondansetron (ZOFRAN) IV, senna-docusate, traMADol  Diet:  Sodium Restricted Activity:  Bedrest DVT Prophylaxis:  SCDs   CLINICALLY SIGNIFICANT STUDIES Basic Metabolic Panel:   Lab 07/02/11 0211 07/01/11 1746 07/01/11 1401 07/01/11 1250  NA 136 -- 140 --  K 4.3 -- 3.0* --  CL 100 -- 104 --  CO2 23 -- -- 23  GLUCOSE 130* -- 162* --  BUN 14 -- 17 --  CREATININE 0.81 0.79 -- --  CALCIUM 8.1* -- -- 9.1  MG -- -- -- --  PHOS -- -- -- --   Liver Function Tests:   Lab 07/01/11 1342  AST 44*  ALT 30  ALKPHOS 43  BILITOT 0.3  PROT 7.4  ALBUMIN 4.0   CBC:   Lab 07/03/11 0913 07/02/11 0211 07/01/11 1250  WBC 5.9 6.7 --  NEUTROABS -- -- 3.7  HGB 11.0* 10.1* --  HCT 30.5* 27.6* --  MCV 85.9 83.9 --  PLT 146* 139* --   Coagulation:   Lab 07/01/11 1341  LABPROT 13.4  INR 1.00   Cardiac Enzymes:   Lab 07/02/11 0914 07/02/11 0212 07/01/11 1746  CKTOTAL 969* 982* 1074*  CKMB 9.9* 9.7* 11.8*  CKMBINDEX -- -- --  TROPONINI <0.30 <0.30 <0.30   Urinalysis:   Lab 07/01/11 1318  COLORURINE YELLOW  LABSPEC 1.012  PHURINE 7.0  GLUCOSEU NEGATIVE  HGBUR NEGATIVE  BILIRUBINUR NEGATIVE  KETONESUR NEGATIVE  PROTEINUR 30*  UROBILINOGEN 0.2  NITRITE NEGATIVE  LEUKOCYTESUR TRACE*   Lipid Panel     Component Value Date/Time   CHOL 329* 07/02/2011 0211   TRIG 52 07/02/2011 0211   HDL 111 07/02/2011 0211   CHOLHDL 3.0 07/02/2011 0211   VLDL 10 07/02/2011 0211   LDLCALC 208* 07/02/2011 0211   HgbA1C  Lab Results  Component Value Date   HGBA1C 6.2* 07/01/2011    Urine Drug Screen:     Component Value Date/Time   LABOPIA NONE DETECTED 07/01/2011 1336   COCAINSCRNUR NONE DETECTED 07/01/2011 1336   LABBENZ NONE DETECTED 07/01/2011 1336   AMPHETMU NONE DETECTED 07/01/2011 1336   THCU NONE DETECTED 07/01/2011 1336   LABBARB NONE DETECTED 07/01/2011 1336    Alcohol Level: No results found for this basename: ETH:2 in the last 168 hours  Thyroid Function Tests:   Lab 07/01/11 1746  TSH 41.678*  T4TOTAL --  FREET4 --  T3FREE --  THYROIDAB --   Anemia Panel:   Lab 07/01/11 1746  VITAMINB12 511  FOLATE 17.5  FERRITIN 90  TIBC 378  IRON 43  RETICCTPCT 1.4   CT of the brain   07/02/11 - Stable compared to 07/01/11. Hyperdense material centered at the right superior cerebellar peduncle/fourth ventricle once again noted. Question if this represents hemorrhage into a cavernoma as versus hemorrhagic infarct as suggested on recent MR report? Slight decrease density of blood noted within the aqueduct extending into the third ventricle. Multiple remote infarcts and small vessel disease type changes as noted on MR.  07/01/2011  - A small amount of acute intraventricular hemorrhage cannot be excluded in the third and fourth ventricles.  Alternatively, this high density could reflect prominent choroid plexus.  Further evaluation of the brain with and without contrast is suggested. Critical Value/emergent results were called by telephone at the time of interpretation on 07/01/2011  at 3:05 p.m.  to  Dr. Fonnie Jarvis, who verbally acknowledged these results. 2.  Chronic microvascular ischemic changes.  MRI of the brain  07/01/2011  9 x 11 mm acute hemorrhage involves the right superior vermis with intraventricular extension;  small foci of acute infarction also noted in the right medial thalamus and superior cerebellar peduncle.  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 There is no subarachnoid blood to suggest  posterior circulation aneurysm.  A periventricular cavernoma which has acutely expanded could have this appearance although I have no previous imaging for comparison; follow-up scanning recommended.  Multiple foci of chronic infarction and chronic hemorrhage are also noted bilaterally as described above.  Findings discussed with EDP.   MRA of the brain not done  2D Echocardiogram 07/02/11 - estimated ejection fraction was in the range of 55% to 60%. Wall motion was normal  Carotid Doppler not done  CXR  07/01/2011   1.  Probable left pleural effusion and left lower lung infiltrate or atelectasis. 2.  Mild cardiomegaly.   EKG  normal sinus rhythm.   Therapy Recommendations PT -, OT -  Physical Exam   Pleasant middle-aged [place] American lady currently not in distress.Awake alert. Afebrile. Head is nontraumatic. Neck is supple without bruit. Hearing is normal. Cardiac exam no murmur or gallop. Lungs are clear to auscultation. Distal pulses are well felt.  Neurological Exam Awake and alert. Normal speech and language.eye movements full without nystagmus. Visual acuity is adequate. Fundi were not visualized. Follows commands very well. Face symmetric. Tongue midline. MILD DYSARTHRIA. Normal strength, tone, reflexes and coordination, EXCEPT MILD DYSMETRIA BUE. Normal sensation. Gait deferred.   ASSESSMENT [name] is a 66 y.o. female with an acute right superior vermis hemorrhage with intraventricular extension.  Hemorrhage secondary to hypertension. 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 On no antiplatelets prior to admission. Now on no antiplatelets for secondary  stroke prevention. Patient with resultant left leg hemiparesis that is improving. Dizziness fluctuating.  -malignant hypertension, not on medications prior to admission, BP 206/104  -hyperlipidemia, LDL 208 -hyperglycemia, HgbA1c 6.2 -elevated cardiac enzymes  Hospital day # 2  TREATMENT/PLAN RECOMMENDATIONS -OOB tomorrow with PT/OT evals -check swallow. Start diet if passes swallow -lisinopril for oral BP control and IV labetalol when necessary for systolic blood pressure goal below 180. -TCD to look at vasculature (based on ASA guidelines, all stroke patients need vascular imaging. As she has already had a MRI this admission, and do not need to look for aneurysms or other cause of hemorrhage, recommend TCD to meet this guideline)  Joycelyn Schmid, MD [facility] 07/03/2011 9:42 AM

## 2011-07-03 NOTE — Evaluation (Signed)
Physical Therapy Evaluation Patient Details Name: Jacqueline King MRN: [id number] DOB: [birth year] Today's Date: 07/03/2011 Time: 1530-1601 PT Time Calculation (min): 31 min  PT Assessment / Plan / Recommendation Clinical Impression  pt admitted with a R cerebellar infarct resulting in significant balance deficits affecting gait and standing activity and causing her various levels of dizziness.  Recommend CIR level of PT before D.C home.    PT Assessment  Patient needs continued PT services    Follow Up Recommendations  Inpatient Rehab    Barriers to Discharge        lEquipment Recommendations  Defer to next venue    Recommendations for Other Services Rehab consult   Frequency Min 3X/week    Precautions / Restrictions     Pertinent Vitals/Pain       Mobility  Bed Mobility Bed Mobility: Supine to Sit;Sitting - Scoot to Edge of Bed;Sit to Supine Supine to Sit: 7: Independent Sitting - Scoot to Edge of Bed: 7: Independent Sit to Supine: 7: Independent Details for Bed Mobility Assistance: easily managed by pt. Transfers Transfers: Sit to Stand;Stand to Sit Sit to Stand: 5: Supervision Stand to Sit: 5: Supervision Details for Transfer Assistance: vc's for hand placement; not assist, though back of legs used to stabiize hersef Ambulation/Gait Ambulation/Gait Assistance: 1: +2 Total assist Ambulation/Gait: Patient Percentage: 60% Ambulation Distance (Feet): 90 Feet Assistive device: 2 person hand held assist Ambulation/Gait Assistance Details: pt with heavy list to the L.  Holds to therapist with Right UE.  Gait characterized by scissoring and stumbling trying to recover balance Gait Pattern: Step-through pattern;Scissoring Stairs: No Modified Rankin (Stroke Patients Only) Pre-Morbid Rankin Score: Moderately severe disability    Exercises     PT Diagnosis: Difficulty walking;Generalized weakness  PT Problem List: Decreased strength;Decreased activity tolerance;Decreased  balance;Decreased mobility;Decreased coordination PT Treatment Interventions:     PT Goals Acute Rehab PT Goals PT Goal Formulation: With patient/family Time For Goal Achievement: 07/10/11 Potential to Achieve Goals: Good Pt will go Sit to Stand: with modified independence PT Goal: Sit to Stand - Progress: Goal set today Pt will go Stand to Sit: with modified independence PT Goal: Stand to Sit - Progress: Goal set today Pt will Transfer Bed to Chair/Chair to Bed: with modified independence PT Transfer Goal: Bed to Chair/Chair to Bed - Progress: Goal set today Pt will Ambulate: 51 - 150 feet;with supervision;with least restrictive assistive device PT Goal: Ambulate - Progress: Goal set today  Visit Information  Last PT Received On: 07/03/11 Assistance Needed: +2 PT/OT Co-Evaluation/Treatment: Yes    Subjective Data  Subjective: no, I don't need your help. Patient Stated Goal: Back to work   Prior Functioning  Home Living Lives With: Alone Available Help at Discharge: Family;Available 24 hours/day Type of Home: House Home Access: Level entry Home Layout: Two level Alternate Level Stairs-Number of Steps: 14 Alternate Level Stairs-Rails: Right;Left;Can reach both Bathroom Shower/Tub: Tub/shower unit;Curtain Firefighter: Standard Home Adaptive Equipment: Grab bars in shower Prior Function Level of Independence: Independent Driving: Yes Vocation: Full time employment    Cognition  Overall Cognitive Status: Appears within functional limits for tasks assessed/performed Arousal/Alertness: Awake/alert Orientation Level: Appears intact for tasks assessed Behavior During Session: [facility] for tasks performed    Extremity/Trunk Assessment Right Lower Extremity Assessment RLE ROM/Strength/Tone: Within functional levels RLE Sensation:  (mildly ataxic with heel/shin) Left Lower Extremity Assessment LLE ROM/Strength/Tone: Within functional levels LLE Coordination: WFL - gross  motor   Balance Balance Balance Assessed: Yes Static Sitting Balance  Static Sitting - Balance Support: Feet unsupported Static Sitting - Level of Assistance: 6: Modified independent (Device/Increase time) Static Sitting - Comment/# of Minutes: 5  End of Session PT - End of Session Activity Tolerance: Patient tolerated treatment well Patient left: in bed;with call bell/phone within reach;with family/visitor present Nurse Communication: Mobility status   Jacqueline King, Eliseo Gum 07/03/2011, 4:57 PM  07/03/2011  [place], PT [phone] (pager)

## 2011-07-03 NOTE — Progress Notes (Signed)
TCD completed. 

## 2011-07-03 NOTE — Progress Notes (Signed)
Occupational Therapy Evaluation Patient Details Name: Jacqueline King MRN: [id number] DOB: 1945-03-06 Today's Date: 07/03/2011 Time: 1530-1601 OT Time Calculation (min): 31 min  OT Assessment / Plan / Recommendation Clinical Impression  Pt admitted with a R cerebellar infarct resulting in significant balance deficits and dizziness. Will benefit from acute OT to address below problem list in prep for d/c to CIR before eventual return home.    OT Assessment  Patient needs continued OT Services    Follow Up Recommendations  Inpatient Rehab    Barriers to Discharge Inaccessible home environment Pt needs to be able to access 2nd level upon return home.  Equipment Recommendations  Defer to next venue    Recommendations for Other Services Rehab consult  Frequency  Min 3X/week    Precautions / Restrictions     Pertinent Vitals/Pain See vitals    ADL  Upper Body Dressing: Performed;Set up Where Assessed - Upper Body Dressing: Unsupported sitting Lower Body Dressing: Performed;Min guard Where Assessed - Lower Body Dressing: Sopported sit to stand Toilet Transfer: Simulated;+2 Total assistance Toilet Transfer: Patient Percentage: 60% Toilet Transfer Method:  (ambulating) Acupuncturist:  (bed) Equipment Used: Gait belt Transfers/Ambulation Related to ADLs: Pt ambulated ~90 ft with +2 hand held assist due to heavy left side lean.  Pt with scissoring and stumbling during ambulation. ADL Comments: Pt able to perform EOB ADLs at setup level but requires physical assist for all standing components of ADLs due to decreased balance.    OT Diagnosis: Generalized weakness  OT Problem List: Impaired balance (sitting and/or standing);Decreased knowledge of use of DME or AE OT Treatment Interventions: Self-care/ADL training;Neuromuscular education;DME and/or AE instruction;Therapeutic activities;Patient/family education;Balance training   OT Goals Acute Rehab OT Goals OT Goal  Formulation: With patient Time For Goal Achievement: 07/17/11 Potential to Achieve Goals: Good ADL Goals Pt Will Perform Grooming: with modified independence;Standing at sink ADL Goal: Grooming - Progress: Goal set today Pt Will Transfer to Toilet: with supervision;Ambulation;Regular height toilet;Other (comment) (least restrictive device) ADL Goal: Toilet Transfer - Progress: Goal set today Miscellaneous OT Goals Miscellaneous OT Goal #1: Pt will perform dynamic standing balance task ~10 min with supervision in prep for retrieving ADL items during functional ambulation. OT Goal: Miscellaneous Goal #1 - Progress: Goal set today  Visit Information  Last OT Received On: 07/03/11 Assistance Needed: +2    Subjective Data      Prior Functioning  Home Living Lives With: Alone Available Help at Discharge: Family;Available 24 hours/day Type of Home: House Home Access: Level entry Home Layout: Two level Alternate Level Stairs-Number of Steps: 14 Alternate Level Stairs-Rails: Right;Left;Can reach both Bathroom Shower/Tub: Tub/shower unit;Curtain Firefighter: Standard Home Adaptive Equipment: Grab bars in shower Prior Function Level of Independence: Independent Driving: Yes Vocation: Full time employment Communication Communication: No difficulties Dominant Hand: Right    Cognition  Overall Cognitive Status: Appears within functional limits for tasks assessed/performed Arousal/Alertness: Awake/alert Orientation Level: Appears intact for tasks assessed Behavior During Session: [facility] for tasks performed    Extremity/Trunk Assessment Right Upper Extremity Assessment RUE ROM/Strength/Tone: Within functional levels RUE Sensation: WFL - Light Touch;WFL - Proprioception RUE Coordination: WFL - gross/fine motor Left Upper Extremity Assessment LUE ROM/Strength/Tone: Within functional levels LUE Sensation: WFL - Light Touch;WFL - Proprioception LUE Coordination: WFL - gross/fine  motor Right Lower Extremity Assessment RLE ROM/Strength/Tone: Within functional levels RLE Sensation:  (mildly ataxic with heel/shin) Left Lower Extremity Assessment LLE ROM/Strength/Tone: Within functional levels LLE Coordination: WFL - gross motor  Mobility Bed Mobility Bed Mobility: Supine to Sit;Sitting - Scoot to Edge of Bed;Sit to Supine Supine to Sit: 7: Independent Sitting - Scoot to Edge of Bed: 7: Independent Sit to Supine: 7: Independent Details for Bed Mobility Assistance: easily managed by pt. Transfers Sit to Stand: 5: Supervision Stand to Sit: 5: Supervision Details for Transfer Assistance: vc's for hand placement; not assist, though back of legs used to stabiize hersef   Exercise    Balance Balance Balance Assessed: Yes Static Sitting Balance Static Sitting - Balance Support: Feet unsupported Static Sitting - Level of Assistance: 6: Modified independent (Device/Increase time) Static Sitting - Comment/# of Minutes: 5  End of Session OT - End of Session Equipment Utilized During Treatment: Gait belt Activity Tolerance: Patient tolerated treatment well Patient left: in bed;with call bell/phone within reach;with family/visitor present Nurse Communication: Mobility status  07/03/2011 Cipriano Mile OTR/L Pager [phone] Office [phone]  Cipriano Mile 07/03/2011, 5:32 PM

## 2011-07-03 NOTE — Progress Notes (Signed)
Internal Medicine Teaching Service Attending Note Date: 07/03/2011  Patient name: Jacqueline King  Medical record number: [id number]  Date of birth: 1945/02/04    This patient has been seen and discussed with the house staff. Please see their note for complete details. I concur with their findings with the following additions/corrections:  66 year old lady with post ablative hypothyroidism and uncontrolled hypertension, admitted with dizziness and severe hypertension and found to have 9 x 11 mm acute hemorrhage involves the right superior vermis with  intraventricular extension; small foci of acute infarction also noted in the right medial thalamus and superior cerebellar peduncle   HTN: add HCTZ to her norvasc and use iv labetalol prn  CVA: bp control, will need higher dose of statin, will increase her lipitor but i think she will need crestor as an outpatient. Fu TC dopplers, PT, OT  Pleural effusion with atelectasis: followup with CXR in am. No evidence for pneumonia  Hypothyroidism: synthroid restarted at low dose   [name] 07/03/2011, 11:45 AM

## 2011-07-03 NOTE — Progress Notes (Signed)
Subjective: She still reports having some dizziness this AM. Denies nay headaches, blurry vision, nausea or vomiting.  Objective: Vital signs in last 24 hours: Filed Vitals:   07/03/11 1100 07/03/11 1115 07/03/11 1130 07/03/11 1145  BP: 172/85 168/87 173/85 157/85  Pulse: 61 57 57 59  Temp:      TempSrc:      Resp: 12 11 9 16   Height:      Weight:      SpO2: 93% 93% 95% 95%   Weight change:   Intake/Output Summary (Last 24 hours) at 07/03/11 1208 Last data filed at 07/03/11 0837  Gross per 24 hour  Intake 1646.25 ml  Output   1100 ml  Net 546.25 ml   BP 157/85  Pulse 59  Temp(Src) 98.9 F (37.2 C) (Oral)  Resp 16  Ht 5\' 1"  (1.549 m)  Wt 129 lb 13.6 oz (58.9 kg)  BMI 24.54 kg/m2  SpO2 95%  General Appearance:    Alert, cooperative, no distress, appears stated age  Head:    Normocephalic, without obvious abnormality, atraumatic  Eyes:    PERRL, conjunctiva/corneas clear, EOM's intact, fundi    benign, both eyes  Ears:    Normal TM's and external ear canals, both ears  Nose:   Nares normal, septum midline, mucosa normal, no drainage    or sinus tenderness  Throat:   Lips, mucosa, and tongue normal; teeth and gums normal  Neck:   Supple, symmetrical, trachea midline, no adenopathy;    thyroid:  no enlargement/tenderness/nodules; no carotid   bruit or JVD  Back:     Symmetric, no curvature, ROM normal, no CVA tenderness  Lungs:     Clear to auscultation bilaterally, respirations unlabored  Chest Wall:    No tenderness or deformity   Heart:    Regular rate and rhythm, S1 and S2 normal, no murmur, rub   or gallop  Breast Exam:    No tenderness, masses, or nipple abnormality  Abdomen:     Soft, non-tender, bowel sounds active all four quadrants,    no masses, no organomegaly  Genitalia:    Normal female without lesion, discharge or tenderness  Rectal:    Normal tone, normal prostate, no masses or tenderness;   guaiac negative stool  Extremities:   Extremities normal,  atraumatic, no cyanosis or edema  Pulses:   2+ and symmetric all extremities  Skin:   Skin color, texture, turgor normal, no rashes or lesions  Lymph nodes:   Cervical, supraclavicular, and axillary nodes normal  Neurologic:   CNII-XII intact, normal strength, sensation and reflexes    throughout   Lab Results: Basic Metabolic Panel:  Lab 07/03/11 4098 07/02/11 0211  NA 139 136  K 3.6 4.3  CL 109 100  CO2 21 23  GLUCOSE 142* 130*  BUN 15 14  CREATININE 0.89 0.81  CALCIUM 8.2* 8.1*  MG -- --  PHOS -- --   Liver Function Tests:  Lab 07/03/11 0913 07/01/11 1342  AST 53* 44*  ALT 38* 30  ALKPHOS 41 43  BILITOT 0.3 0.3  PROT 6.7 7.4  ALBUMIN 3.5 4.0   No results found for this basename: LIPASE:2,AMYLASE:2 in the last 168 hours No results found for this basename: AMMONIA:2 in the last 168 hours CBC:  Lab 07/03/11 0913 07/02/11 0211 07/01/11 1250  WBC 5.9 6.7 --  NEUTROABS -- -- 3.7  HGB 11.0* 10.1* --  HCT 30.5* 27.6* --  MCV 85.9 83.9 --  PLT  146* 139* --   Cardiac Enzymes:  Lab 07/02/11 0914 07/02/11 0212 07/01/11 1746  CKTOTAL 969* 982* 1074*  CKMB 9.9* 9.7* 11.8*  CKMBINDEX -- -- --  TROPONINI <0.30 <0.30 <0.30   BNP: No results found for this basename: PROBNP:3 in the last 168 hours D-Dimer: No results found for this basename: DDIMER:2 in the last 168 hours CBG:  Lab 07/01/11 1412  GLUCAP 147*   Hemoglobin A1C:  Lab 07/01/11 1746  HGBA1C 6.2*   Fasting Lipid Panel:  Lab 07/02/11 0211  CHOL 329*  HDL 111  LDLCALC 208*  TRIG 52  CHOLHDL 3.0  LDLDIRECT --   Thyroid Function Tests:  Lab 07/01/11 1746  TSH 41.678*  T4TOTAL --  FREET4 --  T3FREE --  THYROIDAB --   Coagulation:  Lab 07/01/11 1341  LABPROT 13.4  INR 1.00   Anemia Panel:  Lab 07/01/11 1746  VITAMINB12 511  FOLATE 17.5  FERRITIN 90  TIBC 378  IRON 43  RETICCTPCT 1.4   Urine Drug Screen: Drugs of Abuse     Component Value Date/Time   LABOPIA NONE DETECTED  07/01/2011 1336   COCAINSCRNUR NONE DETECTED 07/01/2011 1336   LABBENZ NONE DETECTED 07/01/2011 1336   AMPHETMU NONE DETECTED 07/01/2011 1336   THCU NONE DETECTED 07/01/2011 1336   LABBARB NONE DETECTED 07/01/2011 1336  Urinalysis:  Lab 07/01/11 1318  COLORURINE YELLOW  LABSPEC 1.012  PHURINE 7.0  GLUCOSEU NEGATIVE  HGBUR NEGATIVE  BILIRUBINUR NEGATIVE  KETONESUR NEGATIVE  PROTEINUR 30*  UROBILINOGEN 0.2  NITRITE NEGATIVE  LEUKOCYTESUR TRACE*      Micro Results: Recent Results (from the past 240 hour(s))  MRSA PCR SCREENING     Status: Normal   Collection Time   07/01/11  7:58 PM      Component Value Range Status Comment   MRSA by PCR NEGATIVE  NEGATIVE  Final    Studies/Results: Ct Head Wo Contrast  07/02/2011  *RADIOLOGY REPORT*  Clinical Data: Intracranial hemorrhage follow-up.  CT HEAD WITHOUT CONTRAST  Technique:  Contiguous axial images were obtained from the base of the skull through the vertex without contrast.  Comparison: 07/01/2011 MR brain and CT.  Findings: Hyperdense material centered at the right superior cerebellar peduncle/fourth ventricle once again noted.  Question if this represents hemorrhage into a cavernoma?  Slight decrease density of blood noted within the aqueduct extending into the third ventricle.  Multiple remote infarcts and small vessel disease type changes as noted on MR.  The MR questioned new small acute infarct of the medial right thalamus and the superior cerebral peduncle are not as well appreciated on the present CT.  IMPRESSION: Hyperdense material centered at the right superior cerebellar peduncle/fourth ventricle once again noted.  Question if this represents hemorrhage into a cavernoma as versus hemorrhagic infarct as suggested on recent MR report?  Slight decrease density of blood noted within the aqueduct extending into the third ventricle.  Multiple remote infarcts and small vessel disease type changes as noted on MR.  The MR questioned new small medial  right thalamus is not as well appreciated on the present CT.  Original Report Authenticated By: Fuller Canada, M.D.   Ct Head Wo Contrast  07/01/2011  *RADIOLOGY REPORT*  Clinical Data: Near syncope  CT HEAD WITHOUT CONTRAST  Technique:  Contiguous axial images were obtained from the base of the skull through the vertex without contrast.  Comparison: None.  Findings: There is asymmetric curvilinear high density along the right aspect of  the fourth ventricle on image number 13.  Linear high density projects within the central portion of the fourth ventricle and third ventricle on images 12 and 13.  This finding could reflect benign prominent choroid plexus, however a small amount of acute hemorrhage is difficult to exclude.  There are no prior studies for comparison.  There is patchy hypodensity in the periventricular white matter bilaterally suggesting chronic microvascular ischemic changes.  The ventricles are normal in size.  No evidence of acute cortically based infarction.  Negative for mass effect or midline shift.  The visualized paranasal sinuses, mastoid air cells, and middle ears are clear.  The mastoid air cells are clear.  The skull is intact.  Soft tissues of the scalp are symmetric.  IMPRESSION: 1. A small amount of acute intraventricular hemorrhage cannot be excluded in the third and fourth ventricles.  Alternatively, this high density could reflect prominent choroid plexus.  Further evaluation of the brain with and without contrast is suggested. Critical Value/emergent results were called by telephone at the time of interpretation on 07/01/2011  at 3:05 p.m.  to  Dr. Fonnie Jarvis, who verbally acknowledged these results. 2.  Chronic microvascular ischemic changes.  Original Report Authenticated By: Britta Mccreedy, M.D.   Mr Brain Wo Contrast  07/01/2011  *RADIOLOGY REPORT*  Clinical Data: Dizzy with nausea.  Hypertension.  MRI HEAD WITHOUT CONTRAST  Technique:  Multiplanar, multiecho pulse sequences of  the brain and surrounding structures were obtained according to standard protocol without intravenous contrast.  Comparison: 07/01/2011 earlier today.  Findings:  There is an acute 9 x 11 mm cross-section hemorrhage involving the right superior vermis/right superior cerebellar peduncle/dorsolateral recess of the fourth ventricle with intraventricular extension. There is some extension into the aqueduct and posterior third ventricle.  No subarachnoid blood. There is a 5 mm area of restricted diffusion immediately adjacent to the hemorrhage suggesting superimposed acute infarction. A 2 mm area of restricted diffusion affects the right thalamus suggesting a second area of acute infarction.  There is atrophy with extensive chronic microvascular ischemic change.  Gradient sequences demonstrate numerous right cerebellar, bilateral thalamic, and right superior temporal foci of hemorrhage representing either hypertensive cerebrovascular disease or cerebral amyloid angiopathy. Large lacunar infarcts are noted in the lower midbrain on the right and left as well as in the periventricular white matter.  Major intracranial vascular structures patent.  Chronic sinus disease affects the left maxillary region.  Compared with the prior CT, the appearance is similar.  IMPRESSION: 9 x 11 mm acute hemorrhage involves the right superior vermis with intraventricular extension; small foci of acute infarction also noted in the right medial thalamus and superior cerebellar peduncle.  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 There is no subarachnoid blood to suggest  posterior circulation aneurysm.  A periventricular cavernoma which has acutely expanded could have this appearance although I have no previous imaging for comparison; follow-up scanning recommended.  Multiple foci of chronic infarction and chronic hemorrhage are also noted bilaterally as described above.  Findings discussed with EDP.  Original Report  Authenticated By: Elsie Stain, M.D.   Dg Chest Port 1 View  07/01/2011  *RADIOLOGY REPORT*  Clinical Data: Dizzy, nausea  PORTABLE CHEST - 1 VIEW  Comparison: None.  Findings: Mild cardiomegaly.  Suspect left pleural effusion. Atelectasis or infiltrate at the left lung base.  Low lung volumes with resultant crowding of bronchovascular structures.  Regional bones unremarkable.  IMPRESSION:  1.  Probable left pleural effusion and left lower lung infiltrate  or atelectasis. 2.  Mild cardiomegaly.  Original Report Authenticated By: Osa Craver, M.D.   Medications: I have reviewed the patient's current medications. Scheduled Meds:   . amLODipine  5 mg Oral Daily  . atorvastatin  20 mg Oral q1800  . hydrochlorothiazide  25 mg Oral Daily  . levothyroxine  25 mcg Oral QAC breakfast  . pantoprazole  40 mg Oral QHS  . senna-docusate  1 tablet Oral BID  . DISCONTD: lisinopril  10 mg Oral Daily  . DISCONTD: pantoprazole (PROTONIX) IV  40 mg Intravenous QHS   Continuous Infusions:   . DISCONTD: sodium chloride Stopped (07/03/11 0837)   PRN Meds:.acetaminophen, acetaminophen, labetalol, morphine injection, ondansetron (ZOFRAN) IV, senna-docusate, traMADol Assessment/Plan: 66 year old woman with past medical history significant for long-standing uncontrolled hypertension, on no medications and no healthcare followups for long time presents to th hospital with dizziness and was found to have a blood pressure of 216/115.   1. Hypertensive emergency: Patient presented with dizziness and blood pressure of 216/115 and was noted to have right superior cerebellar vermis hemorrhage and  right medial thalamus infarcts on her MRI on 07/01/2011. She was also noted to have a elevated CK-MB. EKG did not show any acute ST- T wave changes. Neurology consult was obtained and patient was started Labetalol PRN for malignant HTN. Repeat CT scan on 07/02/11 does not show any extension of her hemorrhage. Her BP are  more stable- slightly elevated in AM requiring one extra dose of labetalol.Patient and the family reports the history of renal artery stenosis s/p angioplasty in 1994, therefore we would not use lisinopril for her. - Appreciate neuro inputs!  - Continue her on labetalol as needed.  - started her on Norvac yesterday. Change lisinopril to HCTZ (? RAS) - ASA for primary stroke prevention  - Started her on lipitor for LDL >200.  - F/u on carotid dopplers, and transcranial doppler  - PT/OT consult.  2. Hypothyroidism( status post radioiodine ablation in 2007): TSH was elevated in 40s.  -Her on Synthroid 25 mcg per day. Will titrate the dose outpatient in 4 weeks.  3. HLD: Elevated LDL to 208  - Continue lipitor.  4. Pre- diabetes: AIC- 6.2.  - Diet and exercise for now  5. Anemia: baseline not known , presented around 10.Stable. Iron - 43, Ferritin 90, saturation -11%  - start her on ferrous sulphate  - FOBT stools  6. Dispo: Transfer to telemetry     LOS: 2 days   Jacqueline King 07/03/2011, 12:08 PM

## 2011-07-04 ENCOUNTER — Inpatient Hospital Stay (HOSPITAL_COMMUNITY): Payer: BC Managed Care – PPO

## 2011-07-04 ENCOUNTER — Encounter (HOSPITAL_COMMUNITY): Payer: Self-pay | Admitting: Radiology

## 2011-07-04 DIAGNOSIS — I619 Nontraumatic intracerebral hemorrhage, unspecified: Secondary | ICD-10-CM

## 2011-07-04 DIAGNOSIS — I6330 Cerebral infarction due to thrombosis of unspecified cerebral artery: Secondary | ICD-10-CM

## 2011-07-04 DIAGNOSIS — I69993 Ataxia following unspecified cerebrovascular disease: Secondary | ICD-10-CM

## 2011-07-04 LAB — CBC
HCT: 29.6 % — ABNORMAL LOW (ref 36.0–46.0)
MCV: 85.1 fL (ref 78.0–100.0)
Platelets: 145 10*3/uL — ABNORMAL LOW (ref 150–400)
RBC: 3.48 MIL/uL — ABNORMAL LOW (ref 3.87–5.11)
RDW: 15 % (ref 11.5–15.5)
WBC: 6.2 10*3/uL (ref 4.0–10.5)

## 2011-07-04 LAB — BASIC METABOLIC PANEL
CO2: 23 mEq/L (ref 19–32)
Chloride: 104 mEq/L (ref 96–112)
Creatinine, Ser: 0.87 mg/dL (ref 0.50–1.10)
GFR calc Af Amer: 79 mL/min — ABNORMAL LOW (ref >90)
Sodium: 138 mEq/L (ref 135–145)

## 2011-07-04 MED ORDER — HYDRALAZINE HCL 20 MG/ML IJ SOLN
10.0000 mg | INTRAMUSCULAR | Status: DC | PRN
  Filled 2011-07-04: qty 0.5

## 2011-07-04 MED ORDER — ATORVASTATIN CALCIUM 40 MG PO TABS
40.0000 mg | ORAL_TABLET | Freq: Every day | ORAL | Status: DC
  Administered 2011-07-04: 40 mg via ORAL
  Filled 2011-07-04 (×2): qty 1

## 2011-07-04 MED ORDER — CHLORTHALIDONE 25 MG PO TABS
25.0000 mg | ORAL_TABLET | Freq: Every day | ORAL | Status: DC
  Administered 2011-07-04 – 2011-07-05 (×2): 25 mg via ORAL
  Filled 2011-07-04 (×2): qty 1

## 2011-07-04 MED ORDER — IOHEXOL 350 MG/ML SOLN
100.0000 mL | Freq: Once | INTRAVENOUS | Status: AC | PRN
  Administered 2011-07-04: 100 mL via INTRAVENOUS

## 2011-07-04 MED ORDER — AMLODIPINE BESYLATE 5 MG PO TABS
5.0000 mg | ORAL_TABLET | Freq: Every day | ORAL | Status: DC
  Administered 2011-07-04 – 2011-07-05 (×2): 5 mg via ORAL
  Filled 2011-07-04 (×2): qty 1

## 2011-07-04 MED ORDER — METOPROLOL SUCCINATE ER 25 MG PO TB24
25.0000 mg | ORAL_TABLET | Freq: Every day | ORAL | Status: DC
  Administered 2011-07-04 – 2011-07-05 (×2): 25 mg via ORAL
  Filled 2011-07-04 (×2): qty 1

## 2011-07-04 MED ORDER — AMLODIPINE BESYLATE 10 MG PO TABS: 10.0000 mg | ORAL_TABLET | Freq: Every day | ORAL | Status: DC

## 2011-07-04 NOTE — Progress Notes (Signed)
Resident Co-sign Daily Note: I have seen the patient and reviewed the daily progress note by MS 4 and discussed the care of the patient with them.  See below for documentation of my findings, assessment, and plans.  Subjective: Patient resting in bed, NAD, no major events overnight.   Objective: Vital signs in last 24 hours: Filed Vitals:   07/03/11 2129 07/04/11 0505 07/04/11 1000 07/04/11 1040  BP: 144/80 160/88 185/99 161/95  Pulse: 60 59 63   Temp: 98.1 F (36.7 C) 97.3 F (36.3 C) 98 F (36.7 C)   TempSrc: Oral Oral Oral   Resp: 18 18 18    Height:      Weight: 130 lb 11.7 oz (59.3 kg)     SpO2: 93% 95% 96%    Physical Exam: General: alert, well-developed, and cooperative to examination.  Lungs: normal respiratory effort, no accessory muscle use, normal breath sounds, no crackles, and no wheezes. Heart: normal rate, regular rhythm, no murmur, no gallop, and no rub.  Abdomen: soft, NT,  normal bowel sounds,  Extremities: No cyanosis, clubbing, edema Neurologic: Non focal  Medications: Scheduled Meds:   . amLODipine  5 mg Oral Daily  . atorvastatin  40 mg Oral q1800  . chlorthalidone  25 mg Oral Daily  . levothyroxine  25 mcg Oral QAC breakfast  . metoprolol succinate  25 mg Oral Daily  . pantoprazole  40 mg Oral QHS  . senna-docusate  1 tablet Oral BID  . DISCONTD: amLODipine  10 mg Oral Daily  . DISCONTD: amLODipine  5 mg Oral Daily  . DISCONTD: atorvastatin  20 mg Oral q1800  . DISCONTD: hydrochlorothiazide  25 mg Oral Daily   Continuous Infusions:  PRN Meds:.acetaminophen, acetaminophen, morphine injection, ondansetron (ZOFRAN) IV, senna-docusate, traMADol, DISCONTD: labetalol Assessment/Plan: Please refer to medical students note for detailed plan, in brief;   Hypertensive emergency with IC hemorrhage: pt stable, continue to adjust BP meds for goal SBP <140.   Hypothyroidism: continue synthroid and recheck TSH as outpatient to make further dose adjustment.      Anemia: unclear cause, will check stool FOBT for possible GI loss, if positive, will refer to GI for further workup.   Hypocalcemia: order Vit D and PTH.   Hypokalemia: KCL.   Dispo: Outpatient rehab vs inpatient rehab. SW for assistance.      LOS: 3 days   Axie Hayne 07/04/2011, 10:42 AM

## 2011-07-04 NOTE — Progress Notes (Signed)
Stroke Team Progress Note  HISTORY Serenna Deroy is an 66 y.o. female experienced acute onset of severe vertigo with associated nausea and near syncopal symptoms while at work early this afternoon 07/01/2011. She has not experienced diplopia. She had no focal weakness or numbness. She also has not experienced headache. She was noted to have nystagmus in all directions of gaze on arrival in the emergency room with nausea as well. No focal deficits were noted. Patient indicated her speech is slightly slurred but no other neurologic changes. CT scan of the head showed equivocal high density abnormality involving third and fourth ventricles. Acute hemorrhage could be ruled out. Further evaluation with MRI showed acute hemorrhage involving the right superior cerebellar vermis with extension into fourth ventricle. There was also evidence of acute infarction involving the right medial thalamus and superior cerebellar peduncle. Atrophy as well as extensive microvascular ischemic changes as noted. Lacunar infarctions were noted in the lower midbrain on the right and left as well as and peri-ventricular white matter. Gradient sequences demonstrate numerous right cerebellar, bilateral thalamic and superior temporal foci of hemorrhage, indicating possible underlying amyloid angiopathy. Patient's symptoms of vertigo improved after arriving in the emergency room. Patient was not a TPA candidate secondary to hemorrhage. She was admitted to the neuro ICU for further evaluation and treatment.  SUBJECTIVE Patient without complaints. RN in room. Patient complains LE edema is increasing, hands swollen.  OBJECTIVE Most recent Vital Signs: Filed Vitals:   07/03/11 1614 07/03/11 1719 07/03/11 2129 07/04/11 0505  BP: 173/95 152/89 144/80 160/88  Pulse: 61 62 60 59  Temp:   98.1 F (36.7 C) 97.3 F (36.3 C)  TempSrc:   Oral Oral  Resp: 18  18 18   Height:      Weight:   59.3 kg (130 lb 11.7 oz)   SpO2:   93% 95%   CBG  (last 3)   Basename 07/01/11 1412  GLUCAP 147*   Intake/Output from previous day: 06/05 0701 - 06/06 0700 In: 121.3 [I.V.:121.3] Out: -   IV Fluid Intake:     MEDICATIONS    . amLODipine  5 mg Oral Daily  . atorvastatin  40 mg Oral q1800  . chlorthalidone  25 mg Oral Daily  . levothyroxine  25 mcg Oral QAC breakfast  . metoprolol succinate  25 mg Oral Daily  . pantoprazole  40 mg Oral QHS  . senna-docusate  1 tablet Oral BID  . DISCONTD: amLODipine  10 mg Oral Daily  . DISCONTD: amLODipine  5 mg Oral Daily  . DISCONTD: atorvastatin  20 mg Oral q1800  . DISCONTD: hydrochlorothiazide  25 mg Oral Daily   PRN:  acetaminophen, acetaminophen, morphine injection, ondansetron (ZOFRAN) IV, senna-docusate, traMADol, DISCONTD: labetalol  Diet:  Sodium Restricted Activity:  As tolerated DVT Prophylaxis:  SCDs   CLINICALLY SIGNIFICANT STUDIES Basic Metabolic Panel:  Lab 07/04/11 1610 07/03/11 0913  NA 138 139  K 3.3* 3.6  CL 104 109  CO2 23 21  GLUCOSE 98 142*  BUN 10 15  CREATININE 0.87 0.89  CALCIUM 8.7 8.2*  MG -- --  PHOS -- --   Liver Function Tests:  Lab 07/03/11 0913 07/01/11 1342  AST 53* 44*  ALT 38* 30  ALKPHOS 41 43  BILITOT 0.3 0.3  PROT 6.7 7.4  ALBUMIN 3.5 4.0   CBC:  Lab 07/04/11 0630 07/03/11 0913 07/01/11 1250  WBC 6.2 5.9 --  NEUTROABS -- -- 3.7  HGB 10.8* 11.0* --  HCT 29.6*  30.5* --  MCV 85.1 85.9 --  PLT 145* 146* --   Coagulation:  Lab 07/01/11 1341  LABPROT 13.4  INR 1.00   Cardiac Enzymes:  Lab 07/02/11 0914 07/02/11 0212 07/01/11 1746  CKTOTAL 969* 982* 1074*  CKMB 9.9* 9.7* 11.8*  CKMBINDEX -- -- --  TROPONINI <0.30 <0.30 <0.30   Urinalysis:  Lab 07/01/11 1318  COLORURINE YELLOW  LABSPEC 1.012  PHURINE 7.0  GLUCOSEU NEGATIVE  HGBUR NEGATIVE  BILIRUBINUR NEGATIVE  KETONESUR NEGATIVE  PROTEINUR 30*  UROBILINOGEN 0.2  NITRITE NEGATIVE  LEUKOCYTESUR TRACE*   Lipid Panel    Component Value Date/Time   CHOL 329*  07/02/2011 0211   TRIG 52 07/02/2011 0211   HDL 111 07/02/2011 0211   CHOLHDL 3.0 07/02/2011 0211   VLDL 10 07/02/2011 0211   LDLCALC 208* 07/02/2011 0211   HgbA1C  Lab Results  Component Value Date   HGBA1C 6.2* 07/01/2011   Urine Drug Screen:     Component Value Date/Time   LABOPIA NONE DETECTED 07/01/2011 1336   COCAINSCRNUR NONE DETECTED 07/01/2011 1336   LABBENZ NONE DETECTED 07/01/2011 1336   AMPHETMU NONE DETECTED 07/01/2011 1336   THCU NONE DETECTED 07/01/2011 1336   LABBARB NONE DETECTED 07/01/2011 1336   Thyroid Function Tests:  Lab 07/01/11 1746  TSH 41.678*  T4TOTAL --  FREET4 --  T3FREE --  THYROIDAB --   Anemia Panel:  Lab 07/01/11 1746  VITAMINB12 511  FOLATE 17.5  FERRITIN 90  TIBC 378  IRON 43  RETICCTPCT 1.4   CT of the brain   07/02/11 - Stable compared to 07/01/11. Hyperdense material centered at the right superior cerebellar peduncle/fourth ventricle once again noted. Question if this represents hemorrhage into a cavernoma as versus hemorrhagic infarct as suggested on recent MR report? Slight decrease density of blood noted within the aqueduct extending into the third ventricle. Multiple remote infarcts and small vessel disease type changes as noted on MR.  07/01/2011  - A small amount of acute intraventricular hemorrhage cannot be excluded in the third and fourth ventricles.  Alternatively, this high density could reflect prominent choroid plexus.  Further evaluation of the brain with and without contrast is suggested. Critical Value/emergent results were called by telephone at the time of interpretation on 07/01/2011  at 3:05 p.m.  to  Dr. Fonnie Jarvis, who verbally acknowledged these results. 2.  Chronic microvascular ischemic changes.  MRI of the brain  07/01/2011  9 x 11 mm acute hemorrhage involves the right superior vermis with intraventricular extension; small foci of acute infarction also noted in the right medial thalamus and superior cerebellar peduncle.  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 There is no subarachnoid blood to suggest  posterior circulation aneurysm.  A periventricular cavernoma which has acutely expanded could have this appearance although I have no previous imaging for comparison; follow-up scanning recommended.  Multiple foci of chronic infarction and chronic hemorrhage are also noted bilaterally as described above.  Findings discussed with EDP.   MRA of the brain not done  2D Echocardiogram 07/02/11 - estimated ejection fraction was in the range of 55% to 60%. Wall motion was normal  Carotid Doppler not done  TCD  CXR  07/01/2011   1.  Probable left pleural effusion and left lower lung infiltrate or atelectasis. 2.  Mild cardiomegaly.   EKG  normal sinus rhythm.   Therapy Recommendations PT -CIR, OT -CIR  Physical Exam   Pleasant middle-aged African American lady currently not in distress.Awake alert.  Afebrile. Head is nontraumatic. Neck is supple without bruit. Hearing is normal. Cardiac exam no murmur or gallop. Lungs are clear to auscultation. Distal pulses are well felt. Pedal and hand edema bilaterally. Neurological Exam Awake and alert. Normal speech and language.eye movements full without nystagmus. Visual acuity is adequate. Fundi were not visualized. Follows commands very well. Face symmetric. Tongue midline. MILD DYSARTHRIA. Normal strength, tone, reflexes and coordination, . Normal sensation. Gait deferred.   ASSESSMENT Ms. Lucas Exline is a 66 y.o. female with an acute right superior vermis hemorrhage with intraventricular extension.  Hemorrhage secondary to hypertension. 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 On no antiplatelets prior to admission. Now on no antiplatelets given hemorrhage. Patient with resultant left leg hemiparesis that is improving. Dizziness fluctuating.  -malignant  hypertension, not on medications prior to admission, BP 206/104  -hyperlipidemia, LDL 208 -hyperglycemia, HgbA1c 6.2 -elevated cardiac enzymes -pedal edema  Hospital day # 3  TREATMENT/PLAN RECOMMENDATIONS -inpatient rehab consult -recommend changing norvasc due to pedal edema and DC IV fluids as patient eating well. -TCD completed. Dr. Pearlean Brownie will read today  Annie Main, AVNP, ANP-BC, GNP-BC [facility] Pager: [phone] 07/04/2011 9:59 AM  Scribe for Dr. Delia Heady, Stroke Center Medical Director. He has personally reviewed chart, pertinent data, examined the patient and developed the plan of care. Pager:  [phone]

## 2011-07-04 NOTE — Progress Notes (Signed)
CT called to notify me the results of the CT abdomen, which showed abnormalities. 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 No orders received.

## 2011-07-04 NOTE — Progress Notes (Signed)
Received referral from RN re referral to MD at d/c, please be aware that CM does not provide referrals. This pt has insurance and her provider can assist her in locating a PCP in network if she is unable to place calls to MD offices or use [facility] CM will provide phone number for [facility].  Johny Shock RN MPH Case Manager [phone]

## 2011-07-04 NOTE — Progress Notes (Signed)
Medical Student Daily Progress Note 66 yo female with a PMH of uncontrolled hypertension presented to the ED with dizziness. She also complained of nausea. There was no LOC, weakness, vomiting, or headache. No alleviating factors, but movement exacerbated her dizziness  Subjective: Pt was resting quietly in bed this AM. She has no complaints of nausea, dizziness, chest pain, or SOB, or headache.  Objective: Vital signs in last 24 hours: Filed Vitals:   07/03/11 1614 07/03/11 1719 07/03/11 2129 07/04/11 0505  BP: 173/95 152/89 144/80 160/88  Pulse: 61 62 60 59  Temp:   98.1 F (36.7 C) 97.3 F (36.3 C)  TempSrc:   Oral Oral  Resp: 18  18 18   Height:      Weight:   59.3 kg (130 lb 11.7 oz)   SpO2:   93% 95%   Weight change:   Intake/Output Summary (Last 24 hours) at 07/04/11 0701 Last data filed at 07/03/11 0837  Gross per 24 hour  Intake 121.25 ml  Output      0 ml  Net 121.25 ml   Physical Exam: General: resting in bed HEENT: PERRL, EOMI, no scleral icterus Cardiac: RRR, no rubs, murmurs or gallops Pulm: clear to auscultation bilaterally, moving normal volumes of air Abd: soft, nontender, nondistended, BS present Ext: warm and well perfused, no pedal edema Neuro: alert and oriented X3, cranial nerves II-XII grossly intact  Lab Results: Basic Metabolic Panel:  Lab 07/03/11 0960 07/02/11 0211  NA 139 136  K 3.6 4.3  CL 109 100  CO2 21 23  GLUCOSE 142* 130*  BUN 15 14  CREATININE 0.89 0.81  CALCIUM 8.2* 8.1*  MG -- --  PHOS -- --   Liver Function Tests:  Lab 07/03/11 0913 07/01/11 1342  AST 53* 44*  ALT 38* 30  ALKPHOS 41 43  BILITOT 0.3 0.3  PROT 6.7 7.4  ALBUMIN 3.5 4.0   CBC:  Lab 07/03/11 0913 07/02/11 0211 07/01/11 1250  WBC 5.9 6.7 --  NEUTROABS -- -- 3.7  HGB 11.0* 10.1* --  HCT 30.5* 27.6* --  MCV 85.9 83.9 --  PLT 146* 139* --   Cardiac Enzymes:  Lab 07/02/11 0914 07/02/11 0212 07/01/11 1746  CKTOTAL 969* 982* 1074*  CKMB 9.9* 9.7*  11.8*  CKMBINDEX -- -- --  TROPONINI <0.30 <0.30 <0.30   CBG:  Lab 07/01/11 1412  GLUCAP 147*   Hemoglobin A1C:  Lab 07/01/11 1746  HGBA1C 6.2*   Fasting Lipid Panel:  Lab 07/02/11 0211  CHOL 329*  HDL 111  LDLCALC 208*  TRIG 52  CHOLHDL 3.0  LDLDIRECT --   Thyroid Function Tests:  Lab 07/01/11 1746  TSH 41.678*  T4TOTAL --  FREET4 --  T3FREE --  THYROIDAB --   Coagulation:  Lab 07/01/11 1341  LABPROT 13.4  INR 1.00   Anemia Panel:  Lab 07/01/11 1746  VITAMINB12 511  FOLATE 17.5  FERRITIN 90  TIBC 378  IRON 43  RETICCTPCT 1.4   Urinalysis:  Lab 07/01/11 1318  COLORURINE YELLOW  LABSPEC 1.012  PHURINE 7.0  GLUCOSEU NEGATIVE  HGBUR NEGATIVE  BILIRUBINUR NEGATIVE  KETONESUR NEGATIVE  PROTEINUR 30*  UROBILINOGEN 0.2  NITRITE NEGATIVE  LEUKOCYTESUR TRACE*   Misc. Labs: Carotid duplex completed.  Preliminary report: Bilateral: No evidence of hemodynamically significant internal carotid artery stenosis. Vertebral artery flow is antegrade.  SLAUGHTER, VIRGINIA D, RVS  07/02/2011, 3:54 PM  Studies/Results: Ct Head Wo Contrast  07/02/2011  *RADIOLOGY REPORT*  Clinical Data:  Intracranial hemorrhage follow-up.  CT HEAD WITHOUT CONTRAST  Technique:  Contiguous axial images were obtained from the base of the skull through the vertex without contrast.  Comparison: 07/01/2011 MR brain and CT.  Findings: Hyperdense material centered at the right superior cerebellar peduncle/fourth ventricle once again noted.  Question if this represents hemorrhage into a cavernoma?  Slight decrease density of blood noted within the aqueduct extending into the third ventricle.  Multiple remote infarcts and small vessel disease type changes as noted on MR.  The MR questioned new small acute infarct of the medial right thalamus and the superior cerebral peduncle are not as well appreciated on the present CT.  IMPRESSION: Hyperdense material centered at the right superior cerebellar  peduncle/fourth ventricle once again noted.  Question if this represents hemorrhage into a cavernoma as versus hemorrhagic infarct as suggested on recent MR report?  Slight decrease density of blood noted within the aqueduct extending into the third ventricle.  Multiple remote infarcts and small vessel disease type changes as noted on MR.  The MR questioned new small medial right thalamus is not as well appreciated on the present CT.  Original Report Authenticated By: Fuller Canada, M.D.   Medications: I have reviewed the patient's current medications. Scheduled Meds:   . amLODipine  5 mg Oral Daily  . atorvastatin  20 mg Oral q1800  . hydrochlorothiazide  25 mg Oral Daily  . levothyroxine  25 mcg Oral QAC breakfast  . pantoprazole  40 mg Oral QHS  . senna-docusate  1 tablet Oral BID  . DISCONTD: lisinopril  10 mg Oral Daily   Continuous Infusions:   . DISCONTD: sodium chloride Stopped (07/03/11 0837)   PRN Meds:.acetaminophen, acetaminophen, labetalol, morphine injection, ondansetron (ZOFRAN) IV, senna-docusate, traMADol  Assessment/Plan: Hypertensive Emergency - causing intracranial hemorrhage  Pt presented to ED with BP of 216/115. Her chronic uncontrolled hypertension resulted in intracranial hemorrhage causing the dizziness. CT/MRI showed small acute hemorrhage as well as evidence of past infarcts. Morning BP 160/86.  -Carotid duplex showed no significant internal carotid artery stenosis.  -Control BP: changed to oral meds: Amlodipine, Metoprolol and Chlorthaladone  Antioplasty of questionable renal artery stenosis  -Unable to get records from medical records  -We will not use ACE Inhibitors as family is concerned about effects on kidney  Pleural Effusion and Infiltrate vs. Atelectasis  As per radiology report, there is a probable left pleural effusion and left lung infiltrate or atelectasis.  -repeat chest X-ray today to observe any changes  Hyperlipidemia  - Lipitor  (Atorvastatin) 40mg   S/p Radioactive therapy for hyperthyroidism - causing Hypothyroidism  Pt had TSH of 41.7  -Synthroid 25mg   -Follow up in [facility]  Anemia  Anemia can be secondary to GI loss vs. Iron deficiency anemia  -Ferrous Sulfate  -Stool for occult blood/Rectal Exam Hypocalemia -Order Vit D and PTH Hyperglycemia  Pt has no history of diabetes but a positive family history. HbA1c of 6.2  -No immediate treatment - will treat during [facility] appointment  Proteinuria  Possibly due to hypertensive emergency causing glomerular damage.  -monitor as outpatient  Mild Cardiomegaly  2D echo was done and showed mild left ventricular hypertrophy with an EF of 55-60%. Mild mitral valve regurg and a elevated mean left atrial filling pressure.  Elevated Cardiac Enzymes  Troponins are negative and CK MB and total CK are increased indicating cardiac muscle injury likely 2/2 hypertensive emergency. Enzymes are trending down. MI r/o Tropinins are negative x 3.  Hypokalemia -  resolved  K Dur 40mg  PO X 2   Disposition - Patient with get PT/OT today and likely will be sent to neuro tele    LOS: 3 days   This is a Psychologist, occupational Note.  The care of the patient was discussed with Dr. Gilford Rile and the assessment and plan formulated with their assistance.  Please see their attached note for official documentation of the daily encounter.  Verlin Grills 07/04/2011, 7:01 AM

## 2011-07-04 NOTE — Progress Notes (Signed)
I will begin pre certification with [facility] insurance approval for inpt rehab admission once medical workup completed. Noted discussions concerning evaluation for renal artery restenosis. Please clarify further workup before patient can be discharged. [phone] is my contact number.

## 2011-07-04 NOTE — Progress Notes (Signed)
OT Cancellation Note  Treatment cancelled today due to patient receiving procedure or test. Will reattempt as time allows  Nolene Rocks, OTR/L Pager [phone] 07/04/2011, 10:17 AM

## 2011-07-04 NOTE — Consult Note (Signed)
Physical Medicine and Rehabilitation Consult Reason for Consult: Dizziness and balance problems Referring Physician:  Dr. Daiva Eves   HPI: Jacqueline King is a 66 y.o. right handed female with history of uncontrolled HTN, admitted on 07/01/11 with dizziness and nausea. Patient with malignant HTN with BP at 200/100.  She was noted to have nystagmus, ataxia  and nausea on evaluation in ED.  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 2 D echo done revealed mild LVH with EF 55-65%. Carotid dopplers without ICA stenosis. Evaluated by neurology who reducing stroke risk factors with control of lipids and optimum BP control. PT evaluation done yesterday and patient noted to have scissoring gait with heavy list to the left and balance deficits.  MD, PT, OT recommending CIR.  Review of Systems  Neurological: Positive for dizziness.  All other systems reviewed and are negative.    Past Medical History  Diagnosis Date  . Hypertension   . H/O radioactive iodine thyroid ablation    Past Surgical History  Procedure Date  . Abdominal hysterectomy   . Cholecystectomy   . Angioplasty 1993 or 1994   Family History  Problem Relation Age of Onset  . Diabetes Mother   . Diabetes Sister   . Diabetes Brother    Social History:  reports that she has quit smoking. She does not have any smokeless tobacco history on file. She reports that she does not drink alcohol or use illicit drugs.   Allergies  Allergen Reactions  . Sulfa Drugs Cross Reactors Hives   No prescriptions prior to admission    Home: Home Living Lives With: Alone Available Help at Discharge: Family;Available 24 hours/day Type of Home: House Home Access: Level entry Home Layout: Two level Alternate Level Stairs-Number of Steps: 14 Alternate Level  Stairs-Rails: Right;Left;Can reach both Bathroom Shower/Tub: Tub/shower unit;Curtain Firefighter: Standard Home Adaptive Equipment: Grab bars in shower  Functional History: Prior Function Driving: Yes Vocation: Full time employment Comments: Has been planning to retire at the end of the year. Functional Status:  Mobility: Bed Mobility Bed Mobility: Supine to Sit;Sitting - Scoot to Edge of Bed;Sit to Supine Supine to Sit: 7: Independent Sitting - Scoot to Edge of Bed: 7: Independent Sit to Supine: 7: Independent Transfers Transfers: Sit to Stand;Stand to Sit Sit to Stand: 5: Supervision Stand to Sit: 5: Supervision Ambulation/Gait Ambulation/Gait Assistance: 1: +2 Total assist Ambulation/Gait: Patient Percentage: 60% Ambulation Distance (Feet): 90 Feet Assistive device: 2 person hand held assist Ambulation/Gait Assistance Details: pt with heavy list to the L.  Holds to therapist with Right UE.  Gait characterized by scissoring and stumbling trying to recover balance Gait Pattern: Step-through pattern;Scissoring Stairs: No    ADL: ADL Upper Body Dressing: Performed;Set up Where Assessed - Upper Body Dressing: Unsupported sitting Lower Body Dressing: Performed;Min guard Where Assessed - Lower Body Dressing: Sopported sit to stand Toilet Transfer: Simulated;+2 Total assistance Toilet Transfer Method:  (ambulating) Toilet Transfer Equipment:  (bed) Equipment Used: Gait belt Transfers/Ambulation Related to ADLs: Pt ambulated ~90 ft with +2 hand held assist due to heavy left side lean.  Pt with scissoring and stumbling during ambulation. ADL Comments: Pt able to perform EOB ADLs at setup level but requires physical assist for all standing components of ADLs due to decreased balance.  Cognition: Cognition Overall Cognitive Status: Appears  within functional limits for tasks assessed Arousal/Alertness: Awake/alert Orientation Level: Oriented X4 Attention: Sustained Memory:  Appears intact Problem Solving:  (tba) Executive Function:  (tba) Safety/Judgment: Appears intact Cognition Overall Cognitive Status: Appears within functional limits for tasks assessed/performed Arousal/Alertness: Awake/alert Orientation Level: Appears intact for tasks assessed Behavior During Session: [facility] for tasks performed  Blood pressure 161/95, pulse 63, temperature 98 F (36.7 C), temperature source Oral, resp. rate 18, height 5\' 1"  (1.549 m), weight 59.3 kg (130 lb 11.7 oz), SpO2 96.00%. Physical Exam  Nursing note and vitals reviewed. motor strength is 5/5 in bilateral upper extremities and lower extremities Sensation is normal in the upper and lower extremities Cranial nerves II through XII are intact HEENT no evidence of facial droop tongue is midline Eyes extra ocular muscles intact, visual fields are intact to confrontation testing Lungs are clear Heart no rubs murmurs or extra sounds regular rate and rhythm Abdomen positive bowel sounds soft nontender to palpation Extremities no clubbing cyanosis or edema General no acute distress Romberg is positive. leans heavily toward the left side Results for orders placed during the hospital encounter of 07/01/11 (from the past 24 hour(s))  BASIC METABOLIC PANEL     Status: Abnormal   Collection Time   07/04/11  6:30 AM      Component Value Range   Sodium 138  135 - 145 (mEq/L)   Potassium 3.3 (*) 3.5 - 5.1 (mEq/L)   Chloride 104  96 - 112 (mEq/L)   CO2 23  19 - 32 (mEq/L)   Glucose, Bld 98  70 - 99 (mg/dL)   BUN 10  6 - 23 (mg/dL)   Creatinine, Ser 1.61  0.50 - 1.10 (mg/dL)   Calcium 8.7  8.4 - 09.6 (mg/dL)   GFR calc non Af Amer 68 (*) >90 (mL/min)   GFR calc Af Amer 79 (*) >90 (mL/min)  CBC     Status: Abnormal   Collection Time   07/04/11  6:30 AM      Component Value Range   WBC 6.2  4.0 - 10.5 (K/uL)   RBC 3.48 (*) 3.87 - 5.11 (MIL/uL)   Hemoglobin 10.8 (*) 12.0 - 15.0 (g/dL)   HCT 04.5 (*) 40.9 - 46.0 (%)   MCV  85.1  78.0 - 100.0 (fL)   MCH 31.0  26.0 - 34.0 (pg)   MCHC 36.5 (*) 30.0 - 36.0 (g/dL)   RDW 81.1  91.4 - 78.2 (%)   Platelets 145 (*) 150 - 400 (K/uL)   Dg Chest 2 View  07/04/2011  *RADIOLOGY REPORT*  Clinical Data: 66 year old female with weakness, shortness of breath, left pleural effusion.  CHEST - 2 VIEW  Comparison: 07/01/2011.  Findings: Continued low lung volumes.  Small bilateral pleural effusions.  Associated patchy basilar airspace disease may be greater on the left.  No pneumothorax or pulmonary edema.  The upper lobes are clear.  Cardiac size and mediastinal contours are within normal limits.  Visualized tracheal air column is within normal limits.  No acute osseous abnormality identified.  Right upper quadrant surgical clips.  IMPRESSION: Low lung volumes with small bilateral pleural effusions. Associated patchy bibasilar opacity, favor atelectasis.  Original Report Authenticated By: Harley Hallmark, M.D.    Assessment/Plan: Diagnosis: cerebellar vermis hemorrhage with truncal ataxia 1. Does the need for close, 24 hr/day medical supervision in concert with the patient's rehab needs make it unreasonable for this patient to be served in a less intensive setting? Yes 2. Co-Morbidities requiring supervision/potential  complications: hypertension, balance disorder, nausea related to CVA 3. Due to bladder management, bowel management, safety, skin/wound care, disease management, medication administration and patient education, does the patient require 24 hr/day rehab nursing? Yes 4. Does the patient require coordinated care of a physician, rehab nurse, PT (1-2 hrs/day, 5 days/week) and OT (1-2 hrs/day, 5 days/week) to address physical and functional deficits in the context of the above medical diagnosis(es)? Yes Addressing deficits in the following areas: balance, endurance, locomotion, strength, transferring, bathing, dressing and toileting 5. Can the patient actively participate in an  intensive therapy program of at least 3 hrs of therapy per day at least 5 days per week? Yes 6. The potential for patient to make measurable gains while on inpatient rehab is excellent 7. Anticipated functional outcomes upon discharge from inpatient rehab are supervision mobility with PT, supervision to modified independent ADLs with OT, not applicable with SLP. 8. Estimated rehab length of stay to reach the above functional goals is: 10-14 days 9. Does the patient have adequate social supports to accommodate these discharge functional goals? Yes and family states that they can come up with a plan to provide 24 7 supervision 10. Anticipated D/C setting: Home 11. Anticipated post D/C treatments: HH therapy initiallythen outpatient   Overall Rehab/Functional Prognosis: excellent  RECOMMENDATIONS: This patient's condition is appropriate for continued rehabilitative care in the following setting: CIR Patient has agreed to participate in recommended program. Yes Note that insurance prior authorization may be required for reimbursement for recommended care.  Comment:    07/04/2011

## 2011-07-04 NOTE — Progress Notes (Signed)
Internal Medicine Teaching Service Attending Note Date: 07/04/2011  Patient name: Jacqueline King  Medical record number: [id number]  Date of birth: January 07, 1946    This patient has been seen and discussed with the house staff. Please see their note for complete details. 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 Greatly appreciate Neurology, PT, OT and Rehab consultants help here. I had extensive discussion with the family on rounds today. THey claim that the patient had DRAMATIC improvement in her HTN after angioplasty of a stenosed renal artery. Therefore we will explore options to evaluate for renal artery restenosis. CTA and MRA would seem to be the more sensitive tests here prior to formal angiography. All of this only worthwhile considering if angioplasty, intervention will make significant impact on her HTN. In the interim we will continue to try to optimize her htn with oral beta blocker, chlorthalidone, and norvasc (that we will likely escalate to 10mg  in am). I think we would have much better chance of the >50% reduction of LDL (from 208 to 100) with 10 mg of crestor rather than the 40mg  of lipitor. Will discuss with pharmacy.we spent greater than 45 minutes with the patient including greater than 50% of time in face to face counsel of the patient and in coordination of their care.   Acey Lav 07/04/2011, 12:35 PM

## 2011-07-05 ENCOUNTER — Inpatient Hospital Stay (HOSPITAL_COMMUNITY)
Admission: AD | Admit: 2011-07-05 | Discharge: 2011-07-16 | DRG: 462 | Disposition: A | Discharge status: Home / Self Care | Payer: BC Managed Care – PPO | Source: Ambulatory Visit | Attending: Physical Medicine & Rehabilitation | Admitting: Physical Medicine & Rehabilitation

## 2011-07-05 ENCOUNTER — Encounter (HOSPITAL_COMMUNITY): Payer: Self-pay | Admitting: Physical Medicine and Rehabilitation

## 2011-07-05 ENCOUNTER — Encounter (HOSPITAL_COMMUNITY): Payer: Self-pay | Admitting: *Deleted

## 2011-07-05 DIAGNOSIS — I6330 Cerebral infarction due to thrombosis of unspecified cerebral artery: Secondary | ICD-10-CM

## 2011-07-05 DIAGNOSIS — I639 Cerebral infarction, unspecified: Secondary | ICD-10-CM | POA: Diagnosis present

## 2011-07-05 DIAGNOSIS — I619 Nontraumatic intracerebral hemorrhage, unspecified: Secondary | ICD-10-CM | POA: Diagnosis present

## 2011-07-05 DIAGNOSIS — E876 Hypokalemia: Secondary | ICD-10-CM

## 2011-07-05 DIAGNOSIS — Z87891 Personal history of nicotine dependence: Secondary | ICD-10-CM

## 2011-07-05 DIAGNOSIS — I10 Essential (primary) hypertension: Secondary | ICD-10-CM

## 2011-07-05 DIAGNOSIS — Z5189 Encounter for other specified aftercare: Secondary | ICD-10-CM

## 2011-07-05 DIAGNOSIS — I69993 Ataxia following unspecified cerebrovascular disease: Secondary | ICD-10-CM

## 2011-07-05 DIAGNOSIS — E890 Postprocedural hypothyroidism: Secondary | ICD-10-CM

## 2011-07-05 DIAGNOSIS — Z882 Allergy status to sulfonamides status: Secondary | ICD-10-CM

## 2011-07-05 DIAGNOSIS — E785 Hyperlipidemia, unspecified: Secondary | ICD-10-CM

## 2011-07-05 DIAGNOSIS — Z833 Family history of diabetes mellitus: Secondary | ICD-10-CM

## 2011-07-05 DIAGNOSIS — I6350 Cerebral infarction due to unspecified occlusion or stenosis of unspecified cerebral artery: Secondary | ICD-10-CM

## 2011-07-05 LAB — CBC
MCH: 30.5 pg (ref 26.0–34.0)
MCHC: 36.4 g/dL — ABNORMAL HIGH (ref 30.0–36.0)
Platelets: 152 10*3/uL (ref 150–400)
RBC: 3.51 MIL/uL — ABNORMAL LOW (ref 3.87–5.11)
RDW: 14.8 % (ref 11.5–15.5)

## 2011-07-05 LAB — BASIC METABOLIC PANEL
Calcium: 9.2 mg/dL (ref 8.4–10.5)
GFR calc Af Amer: 77 mL/min — ABNORMAL LOW (ref >90)
GFR calc non Af Amer: 67 mL/min — ABNORMAL LOW (ref >90)
Sodium: 138 mEq/L (ref 135–145)

## 2011-07-05 MED ORDER — FERROUS SULFATE 325 (65 FE) MG PO TABS
325.0000 mg | ORAL_TABLET | Freq: Two times a day (BID) | ORAL | Status: DC
  Filled 2011-07-05 (×2): qty 1

## 2011-07-05 MED ORDER — LEVOTHYROXINE SODIUM 25 MCG PO TABS
25.0000 ug | ORAL_TABLET | Freq: Every day | ORAL | Status: DC
  Administered 2011-07-06 – 2011-07-16 (×11): 25 ug via ORAL
  Filled 2011-07-05 (×12): qty 1

## 2011-07-05 MED ORDER — ALUM & MAG HYDROXIDE-SIMETH 200-200-20 MG/5ML PO SUSP: 30.0000 mL | ORAL | Status: DC | PRN

## 2011-07-05 MED ORDER — AMLODIPINE BESYLATE 5 MG PO TABS
5.0000 mg | ORAL_TABLET | Freq: Every day | ORAL | Status: DC
  Administered 2011-07-06 – 2011-07-16 (×11): 5 mg via ORAL
  Filled 2011-07-05 (×12): qty 1

## 2011-07-05 MED ORDER — METOPROLOL SUCCINATE ER 25 MG PO TB24
25.0000 mg | ORAL_TABLET | Freq: Every day | ORAL | Status: DC
  Administered 2011-07-06 – 2011-07-16 (×11): 25 mg via ORAL
  Filled 2011-07-05 (×12): qty 1

## 2011-07-05 MED ORDER — TRAZODONE HCL 50 MG PO TABS: 25.0000 mg | ORAL_TABLET | Freq: Every evening | ORAL | Status: DC | PRN

## 2011-07-05 MED ORDER — PANTOPRAZOLE SODIUM 40 MG PO TBEC
40.0000 mg | DELAYED_RELEASE_TABLET | Freq: Every day | ORAL | Status: DC
  Administered 2011-07-05 – 2011-07-15 (×8): 40 mg via ORAL
  Filled 2011-07-05 (×9): qty 1

## 2011-07-05 MED ORDER — FLEET ENEMA 7-19 GM/118ML RE ENEM
1.0000 | ENEMA | Freq: Once | RECTAL | Status: AC | PRN
  Filled 2011-07-05: qty 1

## 2011-07-05 MED ORDER — POTASSIUM CHLORIDE CRYS ER 20 MEQ PO TBCR
40.0000 meq | EXTENDED_RELEASE_TABLET | Freq: Four times a day (QID) | ORAL | Status: DC
  Administered 2011-07-05: 40 meq via ORAL
  Filled 2011-07-05: qty 2

## 2011-07-05 MED ORDER — BISACODYL 10 MG RE SUPP: 10.0000 mg | Freq: Every day | RECTAL | Status: DC | PRN

## 2011-07-05 MED ORDER — POTASSIUM CHLORIDE CRYS ER 20 MEQ PO TBCR
20.0000 meq | EXTENDED_RELEASE_TABLET | Freq: Three times a day (TID) | ORAL | Status: AC
  Administered 2011-07-05 – 2011-07-06 (×3): 20 meq via ORAL
  Filled 2011-07-05 (×3): qty 1

## 2011-07-05 MED ORDER — GUAIFENESIN-DM 100-10 MG/5ML PO SYRP: 5.0000 mL | ORAL_SOLUTION | Freq: Four times a day (QID) | ORAL | Status: DC | PRN

## 2011-07-05 MED ORDER — ACETAMINOPHEN 325 MG PO TABS: 325.0000 mg | ORAL_TABLET | ORAL | Status: DC | PRN

## 2011-07-05 MED ORDER — LISINOPRIL 10 MG PO TABS
10.0000 mg | ORAL_TABLET | Freq: Every day | ORAL | Status: DC
  Administered 2011-07-05: 10 mg via ORAL
  Filled 2011-07-05: qty 1

## 2011-07-05 MED ORDER — TRAMADOL HCL 50 MG PO TABS: 50.0000 mg | ORAL_TABLET | Freq: Four times a day (QID) | ORAL | Status: DC | PRN

## 2011-07-05 MED ORDER — SENNOSIDES-DOCUSATE SODIUM 8.6-50 MG PO TABS
1.0000 | ORAL_TABLET | Freq: Two times a day (BID) | ORAL | Status: DC
  Administered 2011-07-08: 1 via ORAL
  Filled 2011-07-05 (×6): qty 1

## 2011-07-05 MED ORDER — CLONIDINE HCL 0.1 MG PO TABS
0.1000 mg | ORAL_TABLET | Freq: Four times a day (QID) | ORAL | Status: DC | PRN
  Administered 2011-07-05: 0.1 mg via ORAL
  Filled 2011-07-05: qty 1

## 2011-07-05 MED ORDER — PROCHLORPERAZINE MALEATE 5 MG PO TABS
5.0000 mg | ORAL_TABLET | Freq: Four times a day (QID) | ORAL | Status: DC | PRN
  Filled 2011-07-05: qty 2

## 2011-07-05 MED ORDER — CHLORTHALIDONE 25 MG PO TABS
25.0000 mg | ORAL_TABLET | Freq: Every day | ORAL | Status: DC
  Administered 2011-07-06 – 2011-07-16 (×11): 25 mg via ORAL
  Filled 2011-07-05 (×12): qty 1

## 2011-07-05 MED ORDER — ATORVASTATIN CALCIUM 40 MG PO TABS
40.0000 mg | ORAL_TABLET | Freq: Every day | ORAL | Status: DC
  Administered 2011-07-05 – 2011-07-15 (×11): 40 mg via ORAL
  Filled 2011-07-05 (×12): qty 1

## 2011-07-05 MED ORDER — PROCHLORPERAZINE EDISYLATE 5 MG/ML IJ SOLN
5.0000 mg | Freq: Four times a day (QID) | INTRAMUSCULAR | Status: DC | PRN
  Filled 2011-07-05: qty 2

## 2011-07-05 MED ORDER — LISINOPRIL 10 MG PO TABS
10.0000 mg | ORAL_TABLET | Freq: Every day | ORAL | Status: DC
  Administered 2011-07-06 – 2011-07-07 (×2): 10 mg via ORAL
  Filled 2011-07-05 (×3): qty 1

## 2011-07-05 MED ORDER — PROCHLORPERAZINE 25 MG RE SUPP
12.5000 mg | Freq: Four times a day (QID) | RECTAL | Status: DC | PRN
  Filled 2011-07-05: qty 1

## 2011-07-05 MED ORDER — AMLODIPINE BESYLATE 10 MG PO TABS: 10.0000 mg | ORAL_TABLET | Freq: Every day | ORAL | Status: DC

## 2011-07-05 MED ORDER — FERROUS SULFATE 325 (65 FE) MG PO TABS
325.0000 mg | ORAL_TABLET | Freq: Two times a day (BID) | ORAL | Status: DC
  Administered 2011-07-05 – 2011-07-16 (×22): 325 mg via ORAL
  Filled 2011-07-05 (×24): qty 1

## 2011-07-05 NOTE — Progress Notes (Signed)
Patient admitted to rehab unit at 1545 from 6700.  Report received from [place], [place].  Patient alert and oriented, family at bedside very supportive.  Patient and family oriented to the room and rehab routine.  Call bell within reach.

## 2011-07-05 NOTE — Plan of Care (Signed)
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 DVT Prophylaxis/Anticoagulation: Mechanical: Sequential compression devices, below knee Bilateral lower extremities  2. Pain Management: ultram prn for pain.  3. Mood: Monitor for now. LCSW to follow up for formal evalutation.  4. HTN: Continue qid checks for now as BP still labile. Will continue to slowly adjust medication for better control. On norvasc, chlorthalidone and metoprolol with improving control.  Monitor for hypotension as meds get to steady state.   5. Dyslipidemia: Continue Lipitor.  6. Hypothyroid: On supplement.  7. Hypokalemia: Likely due to diuretics. Increased K+ supplement  Effective but low normal.  Will monitor. 8. Heme positive stool: CT with question of colitis. Patient without any GI symptoms currently.  Stool guaiacs pending.  Functional Problem List  Patient demonstrates impairments in the following areas: Balance, Linguistic, Medication Management and Safety  Basic ADL's: bathing, dressing and toileting Advanced ADL's: simple meal preparation  Transfers:  bed mobility, bed to chair, toilet, tub/shower, car and furniture Locomotion:  ambulation  Additional Impairments:  None  Anticipated Outcomes Item Anticipated Outcome  Eating/Swallowing    Basic self-care   Mod I  Tolieting  Mod I  Bowel/Bladder  Continent of bowel and bladder  Transfers  Mod-I  Locomotion  Mod-I  Communication  Mod I  Cognition  Supervision   Pain  Denies pain  Safety/Judgment  Call appropriately  Other     Therapy Plan: PT Frequency: 1-2 X/day, 60-90 minutes OT Frequency: 1-2 X/day, 60-90 minutes SLP Frequency: 1-2 X/day, 30-60 minutes;5 out of 7 days   Team Interventions: Item RN PT OT SLP SW TR Other  Self Care/Advanced ADL  Retraining   x      Neuromuscular Re-Education  x x      Therapeutic Activities  x x      UE/LE Strength Training/ROM  x x      UE/LE Coordination Activities  x x      Visual/Perceptual Remediation/Compensation         DME/Adaptive Equipment Instruction  x x      Therapeutic Exercise  x x      Balance/Vestibular Training  x x      Patient/Family Education x x x x     Cognitive Remediation/Compensation   x x     Functional Mobility Training  x x      Ambulation/Gait Training  x       Psychologist, prison and probation services    x     Speech/Language Facilitation    x     Bladder Management         Bowel Management         Disease Management/Prevention         Pain Management x        Medication Management x        Skin Care/Wound Management x        Splinting/Orthotics         Discharge Planning         Psychosocial Support  Team Discharge Planning: Destination:  Home Projected Follow-up:  OTSLP none Projected Equipment Needs:  Information systems manager SLP none Patient/family involved in discharge planning:  Yes  MD ELOS: 10-12 day Medical Rehab Prognosis:  Excellent Assessment: 66 yo female admitted with cerebellar CVA now requiring CIR level PT,OT, 24/7 rehab RN and MD

## 2011-07-05 NOTE — Progress Notes (Signed)
Report called to Ty, RN on inpatient rehab. Pt remains stable. Pt to be transported to inpatient rehab. [place], Rosanna Randy

## 2011-07-05 NOTE — Progress Notes (Signed)
I met with patient at bedside and discussed plans with attending service. Patient is appropriate for inpt rehab admission pending medical workup completion and insurance approval. I have contacted [employer]. I will follow up today. Please call [phone] with questions.

## 2011-07-05 NOTE — Discharge Instructions (Signed)
Phone number for [facility], with information on physicians accepting new patients. [phone].  STROKE/TIA DISCHARGE INSTRUCTIONS SMOKING Cigarette smoking nearly doubles your risk of having a stroke & is the single most alterable risk factor  If you smoke or have smoked in the last 12 months, you are advised to quit smoking for your health.  Most of the excess cardiovascular risk related to smoking disappears within a year of stopping.  Ask you doctor about anti-smoking medications  [place] Quit Line: 1-800-QUIT NOW  Free Smoking Cessation Classes [phone]  CHOLESTEROL Know your levels; limit fat & cholesterol in your diet  Lipid Panel     Component Value Date/Time   CHOL 329* 07/02/2011 0211   TRIG 52 07/02/2011 0211   HDL 111 07/02/2011 0211   CHOLHDL 3.0 07/02/2011 0211   VLDL 10 07/02/2011 0211   LDLCALC 208* 07/02/2011 0211      Many patients benefit from treatment even if their cholesterol is at goal.  Goal: Total Cholesterol (CHOL) less than 160  Goal:  Triglycerides (TRIG) less than 150  Goal:  HDL greater than 40  Goal:  LDL (LDLCALC) less than 100   BLOOD PRESSURE American Stroke Association blood pressure target is less that 120/80 mm/Hg  Your discharge blood pressure is:  BP: 162/103 mmHg  Monitor your blood pressure  Limit your salt and alcohol intake  Many individuals will require more than one medication for high blood pressure  DIABETES (A1c is a blood sugar average for last 3 months) Goal HGBA1c is under 7% (HBGA1c is blood sugar average for last 3 months)  Diabetes: {STROKE DC DIABETES:22357}    Lab Results  Component Value Date   HGBA1C 6.2* 07/01/2011     Your HGBA1c can be lowered with medications, healthy diet, and exercise.  Check your blood sugar as directed by your physician  Call your physician if you experience unexplained or low blood sugars.  PHYSICAL ACTIVITY/REHABILITATION Goal is 30 minutes at least 4 days per week    {STROKE DC  ACTIVITY/REHAB:22359}  Activity decreases your risk of heart attack and stroke and makes your heart stronger.  It helps control your weight and blood pressure; helps you relax and can improve your mood.  Participate in a regular exercise program.  Talk with your doctor about the best form of exercise for you (dancing, walking, swimming, cycling).  DIET/WEIGHT Goal is to maintain a healthy weight  Your discharge diet is: Sodium Restricted *** liquids Your height is:  Height: 5\' 1"  (154.9 cm) Your current weight is: Weight: 58.1 kg (128 lb 1.4 oz) Your Body Mass Index (BMI) is:  BMI (Calculated): 24.6   Following the type of diet specifically designed for you will help prevent another stroke.  Your goal weight range is:  ***  Your goal Body Mass Index (BMI) is 19-24.  Healthy food habits can help reduce 3 risk factors for stroke:  High cholesterol, hypertension, and excess weight.  RESOURCES Stroke/Support Group:  Call [phone]  they meet the 3rd Sunday of the month on the Rehab Unit at [facility], [place] ( no meetings June, July & Aug).  STROKE EDUCATION PROVIDED/REVIEWED AND GIVEN TO PATIENT  ** no driving until after follow up withDr. Pearlean Brownie Stroke warning signs and symptoms How to activate emergency medical system (call 911). Medications prescribed at discharge. Need for follow-up after discharge. Personal risk factors for stroke. Pneumonia vaccine given:   {STROKE DC YES/NO/DATE:22363} Flu vaccine given:   {STROKE DC YES/NO/DATE:22363} My  questions have been answered, the writing is legible, and I understand these instructions.  I will adhere to these goals & educational materials that have been provided to me after my discharge from the hospital.

## 2011-07-05 NOTE — PMR Pre-admission (Signed)
PMR Admission Coordinator Pre-Admission Assessment  Patient: Jacqueline King is an 66 y.o., female MRN: [id number] DOB: [birth year] Height: 5\' 1"  (154.9 cm) Weight: 58.1 kg (128 lb 1.4 oz)  Insurance Information HMO:    PPO: yes     PCP:      IPA:      80/20:      OTHER: terms 07/28/11 PRIMARY: [employer]      Policy#: [id number]      Subscriber: pt CM Name: Carolan Clines      Phone#: [phone]     Fax#: [phone] Pre-Cert#: [id number]      Employer: [employer] Benefits:  Phone #: [phone]     Name: Alvis Lemmings 07/04/11 ref [id number] Eff. Date: 09/28/09     Deduct: $933      Out of Pocket Max:  [phone]      Life Max: none CIR: $291 copay then 70%      SNF: 70% 100 days max Outpatient: 70% no visit limit     Co-Pay: 30% Home Health: 70%      Co-Pay: 30% no visit limit DME: 70%     Co-Pay: 30% Providers: in network  SECONDARY: Medicare a and b      Policy#: [id number]      Subscriber: pt Benefits:  Phone #: vision share      Name:  07/05/11 Eff. Date: 09/29/2010      6/7/ 13 I notified insurance verification that patient has Medicare a and B secondary for they do not have this listed. Martie Lee)  Emergency Contact Information Contact Information    Name Relation Home Work Humphrey Daughter   [phone]   Manuela Schwartz [phone]  [phone]     Current Medical History  Patient Admitting Diagnosis: Cerebellar vermis hemorrhage with truncal ataxia  History of Present Illness: *Jacqueline King is a 66 y.o. right handed female with history of uncontrolled HTN, admitted on 07/01/11 with dizziness and nausea. Patient with malignant HTN with BP at 200/100. She was noted to have nystagmus, ataxia and nausea on evaluation in ED. 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 2  D echo done revealed mild LVH with EF 55-65%. Carotid dopplers without ICA stenosis. Evaluated by neurology who reducing stroke risk factors with control of lipids and optimum BP control.  Patient with a history of renal artery stenosis and angioplasty 20 years ago. Physician to assess options to evaluate renal artery restenosis.  Total: 0  NIH    Past Medical History  Past Medical History  Diagnosis Date  . Hypertension   . H/O radioactive iodine thyroid ablation     Family History  family history includes Diabetes in her brother, mother, and sister.  Prior Rehab/Hospitalizations: none  Current Medications  Current facility-administered medications:acetaminophen (TYLENOL) suppository 650 mg, 650 mg, Rectal, Q4H PRN, Noel Christmas;  acetaminophen (TYLENOL) tablet 650 mg, 650 mg, Oral, Q4H PRN, Noel Christmas;  amLODipine (NORVASC) tablet 5 mg, 5 mg, Oral, Daily, Darnelle Maffucci, MD, 5 mg at 07/05/11 1761;  atorvastatin (LIPITOR) tablet 40 mg, 40 mg, Oral, q1800, Judie Bonus, MD, 40 mg at 07/04/11 1811 chlorthalidone (HYGROTON) tablet 25 mg, 25 mg, Oral, Daily, Darnelle Maffucci, MD, 25 mg at 07/05/11 0937;  hydrALAZINE (APRESOLINE) injection 10 mg, 10 mg, Intravenous, Q4H PRN, Margorie John,  MD;  iohexol (OMNIPAQUE) 350 MG/ML injection 100 mL, 100 mL, Intravenous, Once PRN, Medication Radiologist, MD, 100 mL at 07/04/11 1346;  levothyroxine (SYNTHROID, LEVOTHROID) tablet 25 mcg, 25 mcg, Oral, QAC breakfast, Elyse Jarvis, MD, 25 mcg at 07/05/11 4098 metoprolol succinate (TOPROL-XL) 24 hr tablet 25 mg, 25 mg, Oral, Daily, Darnelle Maffucci, MD, 25 mg at 07/05/11 1191;  morphine 2 MG/ML injection 1-4 mg, 1-4 mg, Intravenous, Q4H PRN, Darnelle Maffucci, MD;  ondansetron [facility]) injection 4 mg, 4 mg, Intravenous, Q6H PRN, Darnelle Maffucci, MD, 4 mg at 07/01/11 2020;  pantoprazole (PROTONIX) EC tablet 40 mg, 40 mg, Oral, QHS, Severiano Gilbert, PHARMD, 40 mg at 07/04/11 2142 potassium chloride SA  (K-DUR,KLOR-CON) CR tablet 40 mEq, 40 mEq, Oral, Q6H, Elyse Jarvis, MD, 40 mEq at 07/05/11 4782;  senna-docusate (Senokot-S) tablet 1 tablet, 1 tablet, Oral, QHS PRN, Darnelle Maffucci, MD, 1 tablet at 07/01/11 2157;  senna-docusate (Senokot-S) tablet 1 tablet, 1 tablet, Oral, BID, Noel Christmas, 1 tablet at 07/03/11 2218;  traMADol (ULTRAM) tablet 50-100 mg, 50-100 mg, Oral, Q6H PRN, Noel Christmas  Patients Current Diet: Sodium Restricted  Precautions / Restrictions     Prior Activity Level Community (5-7x/wk): works fulltime Plans to retire Sept. 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 2013  Current Functional Level Cognition  Arousal/Alertness: Awake/alert Overall Cognitive Status: Appears within functional limits for tasks assessed Overall Cognitive Status: Appears within functional limits for tasks assessed/performed Orientation Level: Oriented X4 Attention: Sustained Memory: Appears intact Problem Solving: Appears intact Executive Function:  (tba) Safety/Judgment: Appears intact    Extremity Assessment (includes Sensation/Coordination)  RUE ROM/Strength/Tone: Within functional levels RUE Sensation: WFL - Light Touch;WFL - Proprioception RUE Coordination: WFL - gross/fine motor  RLE ROM/Strength/Tone: Within functional levels RLE Sensation:  (mildly ataxic with heel/shin)    ADLs  Upper Body Dressing: Performed;Set up Where Assessed - Upper Body Dressing: Unsupported sitting Lower Body Dressing: Performed;Min guard Where Assessed - Lower Body Dressing: Sopported sit to stand Toilet Transfer: Simulated;+2 Total assistance Toilet Transfer: Patient Percentage: 60% Toilet Transfer Method:   (ambulating) Acupuncturist:  (bed) Equipment Used: Gait belt Transfers/Ambulation Related to ADLs: Pt ambulated ~90 ft with +2 hand held assist due to heavy left side lean.  Pt with scissoring and stumbling during ambulation. ADL Comments: Pt able to perform EOB ADLs at setup level but requires physical assist for all standing components of ADLs due to decreased balance.    Mobility  Bed Mobility: Supine to Sit;Sitting - Scoot to Edge of Bed;Sit to Supine Supine to Sit: 7: Independent Sitting - Scoot to Edge of Bed: 7: Independent Sit to Supine: 7: Independent    Transfers  Transfers: Sit to Stand;Stand to Sit Sit to Stand: 5: Supervision Stand to Sit: 5: Supervision    Ambulation / Gait / Stairs / Psychologist, prison and probation services  Ambulation/Gait Ambulation/Gait Assistance: 1: +2 Total assist Ambulation/Gait: Patient Percentage: 60% Ambulation Distance (Feet): 90 Feet Assistive device: 2 person hand held assist Ambulation/Gait Assistance Details: pt with heavy list to the L.  Holds to therapist with Right UE.  Gait characterized by scissoring and stumbling trying to recover balance Gait Pattern: Step-through pattern;Scissoring Stairs: No    Posture / Balance Static Sitting Balance Static Sitting - Balance Support: Feet unsupported Static Sitting -  Level of Assistance: 6: Modified independent (Device/Increase time) Static Sitting - Comment/# of Minutes: 5     Previous Home Environment Living Arrangements: Alone (alone pta. all 4 children out of town) Lives With: Alone Available Help at Discharge:  (Daughter from [place] here now and to take FMLA) Type of Home: House Home Layout: Two level Alternate Level Stairs-Rails: Right;Left;Can reach both Alternate Level Stairs-Number of Steps: 14 Home Access: Level entry Bathroom Shower/Tub: Tub/shower unit;Curtain Firefighter: Standard Home Care Services: No  Discharge Living Setting Plans for Discharge Living Setting: Patient's  home Type of Home at Discharge: House Discharge Home Layout: Two level;Able to live on main level with bedroom/bathroom Alternate Level Stairs-Number of Steps: 14 steps Discharge Home Access: Level entry Discharge Bathroom Shower/Tub: Tub/shower unit Discharge Bathroom Toilet: Standard Do you have any problems obtaining your medications?: No  Social/Family/Support Systems Patient Roles: Parent;Other (Comment) (works fulltime) Solicitor Information: Jhonnie Garner, dtr here from [place] Anticipated Caregiver: daughter Anticipated Caregiver's Contact Information: [phone] Ability/Limitations of Caregiver: supervision Caregiver Availability: 24/7 Discharge Plan Discussed with Primary Caregiver: Yes Is Caregiver In Agreement with Plan?: Yes Does Caregiver/Family have Issues with Lodging/Transportation while Pt is in Rehab?: No Patient does not have a PCP. Has not followed up since moving to [place] from out of town.  Goals/Additional Needs Patient/Family Goal for Rehab: Supervision PT, supervision to Mod I with OT, Mod I with SLP Expected length of stay: ELOS 10 to 14 days Pt/Family Agrees to Admission and willing to participate: Yes Program Orientation Provided & Reviewed with Pt/Caregiver Including Roles  & Responsibilities: Yes  Patient Condition: This patient's condition remains as documented in the Consult dated 07/04/11, in which the Rehabilitation Physician determined and documented that the patient's condition is appropriate for intensive rehabilitative care in an inpatient rehabilitation facility.  Preadmission Screen Completed By:  Clois Dupes, 07/05/2011 10:16 AM ______________________________________________________________________   Discussed status with Dr. Riley Kill on 07/05/11 at  1038 and received telephone approval for admission today.  Admission Coordinator:  Clois Dupes, time 0981 Date 07/05/11.

## 2011-07-05 NOTE — Progress Notes (Signed)
Medical Student Daily Progress Note 66 yo female with a PMH of uncontrolled hypertension presented to the ED with dizziness. She also complained of nausea. There was no LOC, weakness, vomiting, or headache. No alleviating factors, but movement exacerbated her dizziness    Subjective: Pt was eating breakfast this morning during interview. Pt was in good spirits and had no complaints overnight. Continues to have complaints of dizziness during ambulation.  Objective: Vital signs in last 24 hours: Filed Vitals:   07/04/11 1040 07/04/11 1448 07/04/11 2030 07/05/11 0514  BP: 161/95 155/87 142/99 180/97  Pulse:   63 65  Temp:   98.2 F (36.8 C) 97.3 F (36.3 C)  TempSrc:   Oral Oral  Resp:   16 16  Height:      Weight:   58.1 kg (128 lb 1.4 oz)   SpO2:   98% 98%   Weight change: -1.2 kg (-2 lb 10.3 oz)  Intake/Output Summary (Last 24 hours) at 07/05/11 0738 Last data filed at 07/04/11 1700  Gross per 24 hour  Intake    720 ml  Output      0 ml  Net    720 ml   Physical Exam: General: resting in bed, no acute distress HEENT: PERRL, EOMI, no scleral icterus Cardiac: RRR, no rubs, murmurs or gallops Pulm: clear to auscultation bilaterally, moving normal volumes of air Abd: soft, nontender, nondistended, BS present Ext: warm and well perfused, no pedal edema Neuro: alert and oriented X3, cranial nerves II-XII grossly intact  Lab Results: Basic Metabolic Panel:  Lab 07/04/11 0981 07/03/11 0913  NA 138 139  K 3.3* 3.6  CL 104 109  CO2 23 21  GLUCOSE 98 142*  BUN 10 15  CREATININE 0.87 0.89  CALCIUM 8.7 8.2*  MG -- --  PHOS -- --   Liver Function Tests:  Lab 07/03/11 0913 07/01/11 1342  AST 53* 44*  ALT 38* 30  ALKPHOS 41 43  BILITOT 0.3 0.3  PROT 6.7 7.4  ALBUMIN 3.5 4.0   CBC:  Lab 07/05/11 0630 07/04/11 0630 07/01/11 1250  WBC 5.3 6.2 --  NEUTROABS -- -- 3.7  HGB 10.7* 10.8* --  HCT 29.4* 29.6* --  MCV 83.8 85.1 --  PLT 152 145* --   Cardiac  Enzymes:  Lab 07/02/11 0914 07/02/11 0212 07/01/11 1746  CKTOTAL 969* 982* 1074*  CKMB 9.9* 9.7* 11.8*  CKMBINDEX -- -- --  TROPONINI <0.30 <0.30 <0.30   CBG:  Lab 07/01/11 1412  GLUCAP 147*   Hemoglobin A1C:  Lab 07/01/11 1746  HGBA1C 6.2*   Fasting Lipid Panel:  Lab 07/02/11 0211  CHOL 329*  HDL 111  LDLCALC 208*  TRIG 52  CHOLHDL 3.0  LDLDIRECT --   Thyroid Function Tests:  Lab 07/01/11 1746  TSH 41.678*  T4TOTAL --  FREET4 --  T3FREE --  THYROIDAB --   Coagulation:  Lab 07/01/11 1341  LABPROT 13.4  INR 1.00   Anemia Panel:  Lab 07/01/11 1746  VITAMINB12 511  FOLATE 17.5  FERRITIN 90  TIBC 378  IRON 43  RETICCTPCT 1.4   Urine Drug Screen: Drugs of Abuse     Component Value Date/Time   LABOPIA NONE DETECTED 07/01/2011 1336   COCAINSCRNUR NONE DETECTED 07/01/2011 1336   LABBENZ NONE DETECTED 07/01/2011 1336   AMPHETMU NONE DETECTED 07/01/2011 1336   THCU NONE DETECTED 07/01/2011 1336   LABBARB NONE DETECTED 07/01/2011 1336    Urinalysis:  Lab 07/01/11 1318  COLORURINE  YELLOW  LABSPEC 1.012  PHURINE 7.0  GLUCOSEU NEGATIVE  HGBUR NEGATIVE  BILIRUBINUR NEGATIVE  KETONESUR NEGATIVE  PROTEINUR 30*  UROBILINOGEN 0.2  NITRITE NEGATIVE  LEUKOCYTESUR TRACE*   Micro Results: Recent Results (from the past 240 hour(s))  MRSA PCR SCREENING     Status: Normal   Collection Time   07/01/11  7:58 PM      Component Value Range Status Comment   MRSA by PCR NEGATIVE  NEGATIVE  Final    Studies/Results: Dg Chest 2 View  07/04/2011  *RADIOLOGY REPORT*  Clinical Data: 66 year old female with weakness, shortness of breath, left pleural effusion.  CHEST - 2 VIEW  Comparison: 07/01/2011.  Findings: Continued low lung volumes.  Small bilateral pleural effusions.  Associated patchy basilar airspace disease may be greater on the left.  No pneumothorax or pulmonary edema.  The upper lobes are clear.  Cardiac size and mediastinal contours are within normal limits.   Visualized tracheal air column is within normal limits.  No acute osseous abnormality identified.  Right upper quadrant surgical clips.  IMPRESSION: Low lung volumes with small bilateral pleural effusions. Associated patchy bibasilar opacity, favor atelectasis.  Original Report Authenticated By: Harley Hallmark, M.D.   Ct Angio Abd/pel W/ And/or W/o  07/04/2011  *RADIOLOGY REPORT*  Clinical Data: Uncontrolled hypertension and evaluate for renal artery stenosis.  CT ANGIOGRAPHY ABDOMEN AND PELVIS WITH CONTRAST AND WITHOUT CONTRAST  Technique:  Multidetector CT imaging of the abdomen was performed using the standard protocol during bolus administration of intravenous contrast. Multiplanar reconstructed images including MIPs were obtained and reviewed to evaluate the vascular anatomy.  Comparison: 100 ml Omnipaque 350  Findings:  Arterial structures: The abdominal aorta is patent without aneurysmal dilatation. There is plaque in the common iliac arteries bilaterally, right side greater than left.  The celiac trunk, SMA, bilateral renal arteries and IMA are patent.  The renal arteries appear to be widely patent but there is irregularity of the mid and distal right renal artery.  This may represent areas of ectasia. In addition, there is a very outpouching involving the left renal artery that extends into the upper pole.  This is best seen on sequence #9, image 58.  This could represent a small aneurysm formation and roughly measuring 3 mm.  The superior mesenteric artery appears to be folded or kinked in the proximal aspect but distal branches are patent.  Some of the superior mesenteric artery branches may have a beaded appearance.  Nonvascular structures:  There is a compressive atelectasis at both lung bases. There are tiny bilateral pleural effusions.  The gallbladder been removed.  There is a round 1.1 cm low density structure in the caudate lobe of the liver which is most compatible with a cyst.  Few small lymph  nodes in the cardiophrenic fat along the right side.  In addition, there is pericardial fluid along the inferior aspect of the heart.  No gross abnormality to the spleen, pancreas, adrenal glands or kidneys.  There is diffuse wall thickening of the rectum and the sigmoid colon. There may be mild wall thickening throughout the colon. Tiny amount of free fluid along the right side of the pelvis.  There is no significant abdominal or pelvic lymphadenopathy. Uterus has been removed.  There is leftward scoliosis of the thoracolumbar spine.  Focal sclerotic area along the superior aspect of the L5 vertebral body probably represents a bone island.  IMPRESSION: There is concern for vessel irregularity involving the mid and distal  right renal artery and there may be a tiny aneurysm involving a left renal artery branch.  In addition, the superior mesenteric artery has areas of kinking and mild ectasia.  The findings raise the possibility of fibromuscular dysplasia.  This finding could be further evaluated with catheter angiography.  Diffuse wall thickening of the colon, particularly the rectum and sigmoid colon.   Findings are suspicious for colitis.  Small-moderate sized pericardial fluid.  Tiny pleural effusions and compressive atelectasis.  Original Report Authenticated By: Richarda Overlie, M.D.   Medications: I have reviewed the patient's current medications. Scheduled Meds:   . amLODipine  5 mg Oral Daily  . atorvastatin  40 mg Oral q1800  . chlorthalidone  25 mg Oral Daily  . levothyroxine  25 mcg Oral QAC breakfast  . metoprolol succinate  25 mg Oral Daily  . pantoprazole  40 mg Oral QHS  . senna-docusate  1 tablet Oral BID  . DISCONTD: amLODipine  10 mg Oral Daily  . DISCONTD: amLODipine  5 mg Oral Daily  . DISCONTD: atorvastatin  20 mg Oral q1800  . DISCONTD: hydrochlorothiazide  25 mg Oral Daily   Continuous Infusions:  PRN Meds:.acetaminophen, acetaminophen, hydrALAZINE, iohexol, morphine injection,  ondansetron (ZOFRAN) IV, senna-docusate, traMADol, DISCONTD: labetalol  Assessment/Plan: Hypertensive Emergency - causing intracranial hemorrhage  Pt presented to ED with BP of 216/115. Her chronic uncontrolled hypertension resulted in intracranial hemorrhage causing the dizziness. CT/MRI showed small acute hemorrhage as well as evidence of past infarcts. Morning BP 160/86.  -Carotid doppler showed no significant internal carotid artery stenosis.  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 Mid and distal right renal artery irregularity.  -will treat HTN 2/2 FMD with ACE Inhibitors. If HTN persists, we will consider angiogram and angioplasty in the future. Colitis - Rectum and Sigmoid Colon FOBT positive. -likely due to colitis as per radiology report, but has no ssx or complaints B/l Pleural Effusion and Atelectasis  As per radiology report, there is a probable left pleural effusion and left lung infiltrate or atelectasis.  -continue to monitor. Hyperlipidemia  - Lipitor (Atorvastatin) 40mg   S/p Radioactive therapy for hyperthyroidism - causing Hypothyroidism  Pt had TSH of 41.7  -Synthroid 25mg   -Follow up in [facility]  Anemia  Anemia likely secondary to GI loss (+ FOBT) -Ferrous Sulfate  Hypocalemia  -Order Vit D and PTH  Hyperglycemia  Pt has no history of diabetes but a positive family history. HbA1c of 6.2  -No immediate treatment - will treat during [facility] appointment  Proteinuria  Possibly due to hypertensive emergency causing glomerular damage.  -monitor as outpatient  Mild Cardiomegaly  2D echo was done and showed mild left ventricular hypertrophy with an EF of 55-60%. Mild mitral valve regurg and a elevated mean left atrial filling pressure.  Elevated Cardiac Enzymes  Troponins are negative and CK MB and total CK are increased  indicating cardiac muscle injury likely 2/2 hypertensive emergency. Enzymes are trending down. MI r/o Tropinins are negative x 3.  Hypokalemia - resolved  K Dur 40mg  PO X 2    LOS: 4 days   This is a Psychologist, occupational Note.  The care of the patient was discussed with Dr. Dorthula Rue and the assessment and plan formulated with their assistance.  Please see their attached note for official documentation of the daily encounter.  Verlin Grills 07/05/2011, 7:38 AM  Resident Co-sign Daily Note:  I have seen the patient and reviewed the daily progress note by Verlin Grills MS IV and discussed the care of the patient with them.  See below for documentation of my findings, assessment, and plans.  Subjective: She states that her dizziness is getting a little better but she is more concerned about her speech. Objective: Vital signs in last 24 hours: Filed Vitals:   07/04/11 2030 07/05/11 0514 07/05/11 0810 07/05/11 1400  BP: 142/99 180/97 162/103 155/92  Pulse: 63 65 58 62  Temp: 98.2 F (36.8 C) 97.3 F (36.3 C)  98 F (36.7 C)  TempSrc: Oral Oral  Oral  Resp: 16 16  18   Height:      Weight: 128 lb 1.4 oz (58.1 kg)     SpO2: 98% 98%  98%   Physical Exam: BP 155/92  Pulse 62  Temp(Src) 98 F (36.7 C) (Oral)  Resp 18  Ht 5\' 1"  (1.549 m)  Wt 128 lb 1.4 oz (58.1 kg)  BMI 24.20 kg/m2  SpO2 98%  General Appearance:    Alert, cooperative, no distress, appears stated age  Head:    Normocephalic, without obvious abnormality, atraumatic  Eyes:    PERRL, conjunctiva/corneas clear, EOM's intact, fundi    benign, both eyes  Ears:    Normal TM's and external ear canals, both ears  Nose:   Nares normal, septum midline, mucosa normal, no drainage    or sinus tenderness  Throat:   Lips, mucosa, and tongue normal; teeth and gums normal  Neck:   Supple, symmetrical, trachea midline, no adenopathy;    thyroid:  no enlargement/tenderness/nodules; no carotid   bruit or JVD  Back:     Symmetric, no curvature,  ROM normal, no CVA tenderness  Lungs:     Clear to auscultation bilaterally, respirations unlabored  Chest Wall:    No tenderness or deformity   Heart:    Regular rate and rhythm, S1 and S2 normal, no murmur, rub   or gallop  Breast Exam:    No tenderness, masses, or nipple abnormality  Abdomen:     Soft, non-tender, bowel sounds active all four quadrants,    no masses, no organomegaly  Genitalia:    Normal female without lesion, discharge or tenderness  Rectal:    Normal tone, normal prostate, no masses or tenderness;   guaiac negative stool  Extremities:   Extremities normal, atraumatic, no cyanosis or edema  Pulses:   2+ and symmetric all extremities  Skin:   Skin color, texture, turgor normal, no rashes or lesions  Lymph nodes:   Cervical, supraclavicular, and axillary nodes normal  Neurologic:   CNII-XII intact, normal strength, sensation and reflexes    throughout   Lab Results: Reviewed and documented in Electronic Record Micro Results: Reviewed and documented in Electronic Record Studies/Results: Reviewed and documented in Electronic Record Medications: I have reviewed the patient's current medications. Scheduled Meds:   . amLODipine  10 mg Oral Daily  . atorvastatin  40 mg Oral q1800  . chlorthalidone  25 mg Oral Daily  . levothyroxine  25 mcg Oral QAC breakfast  . lisinopril  10 mg Oral Daily  . metoprolol succinate  25 mg Oral Daily  . pantoprazole  40 mg Oral QHS  . potassium chloride  40 mEq Oral Q6H  . senna-docusate  1 tablet Oral BID  . 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 She was also noted to  have a elevated CK-MB. EKG did not show any acute ST- T wave changes. Neurology consult was obtained and patient was started Labetalol PRN for malignant HTN. Repeat CT scan on 07/02/11 does not show any extension of her hemorrhage. BP are still running in 160-170's. PT/OtTrecommends inpatient rhab - Appreciate neuro inputs!  - started her on metoprolol, chlorthalidone yesterday in addition to Norvasc. Add lisinopril given her poorly controlled HTN and evidence of fibromuscular dysplasia on CT- angio. - ASA for secondary stroke prevention  - Started her on lipitor for LDL >200.   2. Probable Fibromuscular dysplasia on CT angiogarpahy: Patient and the family reports history of renal artery angioplasty about 20 years ago in 1994 subsequent to which CT angiography was also obtained during this admission which shows probable fibromuscular dysplasia. It is difficult to discern how long she has had HTN as she was not following up with any health care provider. Would first try to maximize the medical therapy with the ACE- inhibitors and adding other agents. It seems like the patient might need PTA( Percutaneous transluminal angioplasty) for getting her BP control and given the history in the past . Her imaging was discussed with radiology( Dr. Lowella Dandy) who recommended anticoagulation with the procedure( PTA ) if indicated to attenuate the risks of clot embolization with the procedure. However anticoagulation would be an issue given her recent cerebral hemorrhage  3. Hypothyroidism( status post radioiodine ablation in 2007): TSH was elevated in 40s.  -Her on Synthroid 25 mcg per day. Will titrate the dose outpatient in 4 weeks.   4. HLD: Elevated LDL to 208  - Continue lipitor.   5. Pre- diabetes: AIC- 6.2.  - Diet and exercise for now   6. Anemia: baseline not known , presented around 10.Stable. Iron - 43, Ferritin 90, saturation -11% . Evidence of colitis on CT but patient completely asymptomatic. Recommend  colonoscopy as outpatient. - start her on ferrous sulphate  - FOBT stools   7. Dispo: Discharge to inpatient rehab    LOS: 4 days   Jaziya Obarr 07/05/2011, 2:17 PM

## 2011-07-05 NOTE — Progress Notes (Signed)
Physical Therapy Treatment Patient Details Name: Jacqueline King MRN: [id number] DOB: Aug 22, 1945 Today's Date: 07/05/2011 Time: 8469-6295 PT Time Calculation (min): 18 min  PT Assessment / Plan / Recommendation Comments on Treatment Session  Balance incrementally improved with less stabiity occurring through her UE.  Ready for CIR    Follow Up Recommendations  Inpatient Rehab    Barriers to Discharge        Equipment Recommendations  Defer to next venue    Recommendations for Other Services Rehab consult  Frequency Min 3X/week   Plan Discharge plan remains appropriate    Precautions / Restrictions Precautions Precautions: Fall   Pertinent Vitals/Pain     Mobility  Bed Mobility Bed Mobility: Supine to Sit;Sitting - Scoot to Edge of Bed;Sit to Supine Supine to Sit: 7: Independent Sitting - Scoot to Edge of Bed: 7: Independent Details for Bed Mobility Assistance: easily managed by pt. Transfers Transfers: Sit to Stand;Stand to Sit Sit to Stand: 5: Supervision Stand to Sit: 5: Supervision Details for Transfer Assistance: vc's for hand placement standing and sitting; no lifting asist needed Ambulation/Gait Ambulation/Gait Assistance: 4: Min assist Ambulation Distance (Feet): 390 Feet Assistive device: 1 person hand held assist (with brief periods at min guard) Ambulation/Gait Assistance Details: mild to moderately unsteady throughout with scissoring and balance checks against therapists assistance.  Scanning worsened the instability/scissoring Gait Pattern: Step-through pattern;Left circumduction;Scissoring (mild ataxia that moderated over course of the treatment) Stairs: No Modified Rankin (Stroke Patients Only) Modified Rankin: Moderately severe disability    Exercises     PT Diagnosis:    PT Problem List:   PT Treatment Interventions:     PT Goals Acute Rehab PT Goals PT Goal Formulation: With patient/family Time For Goal Achievement: 07/10/11 Potential to Achieve  Goals: Good PT Goal: Sit to Stand - Progress: Progressing toward goal PT Goal: Stand to Sit - Progress: Progressing toward goal PT Transfer Goal: Bed to Chair/Chair to Bed - Progress: Progressing toward goal PT Goal: Ambulate - Progress: Progressing toward goal  Visit Information  Last PT Received On: 07/05/11 Assistance Needed: +1    Subjective Data  Patient Stated Goal: Back to work   Cognition  Overall Cognitive Status: Appears within functional limits for tasks assessed/performed Arousal/Alertness: Awake/alert Orientation Level: Appears intact for tasks assessed Behavior During Session: [facility] for tasks performed    Balance  Balance Balance Assessed: Yes Static Sitting Balance Static Sitting - Balance Support: Feet unsupported Static Sitting - Level of Assistance: 6: Modified independent (Device/Increase time) Static Standing Balance Static Standing - Balance Support: No upper extremity supported;During functional activity Static Standing - Level of Assistance: 5: Stand by assistance  End of Session PT - End of Session Activity Tolerance: Patient tolerated treatment well Patient left: in chair;with call bell/phone within reach;with family/visitor present Nurse Communication: Mobility status    Phylicia Mcgaugh, Eliseo Gum 07/05/2011, 2:46 PM 07/05/2011  [place], PT [phone] (pager)

## 2011-07-05 NOTE — H&P (Signed)
Physical Medicine and Rehabilitation Admission H&P  Chief Complaint   Patient presents with   .  Near Syncope   :  HPI: Jacqueline King is a 66 y.o. right handed female with history of uncontrolled HTN, admitted on 07/01/11 with dizziness and nausea. Patient with malignant HTN with BP at 200/100. She was noted to have nystagmus, ataxia and nausea on evaluation in ED. 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 2 D echo done revealed mild LVH with EF 55-65%. Carotid dopplers without ICA stenosis. Evaluated by neurology who reducing stroke risk factors with control of lipids and optimum BP control. CTA abdomen done for work up of uncontrolled HTN and revealed vessel irregularity of mid and distal right renal artery with ectasis SMA, small-moderate size pericardial fluid and diffuse wall thickening of rectal and sigmoid colon suspicious for colitis. Followed by [facility] for medical issues- patient has had renal artery angioplasty in the past and focus now would be medical management. Patient evaluated by therapy team and we felt that she would benefit with from CIR.  Review of Systems  HENT: Negative for hearing loss and neck pain.  Eyes: Negative for blurred vision and double vision.  Respiratory: Negative for cough, shortness of breath and wheezing.  Cardiovascular: Negative for chest pain and palpitations.  Gastrointestinal: Negative for nausea, abdominal pain, diarrhea and blood in stool.  Genitourinary: Negative for dysuria and urgency.  Musculoskeletal: Negative for myalgias.  Neurological: Positive for dizziness (improving) and speech change. Negative for headaches.   Past Medical History   Diagnosis  Date   .  Hypertension    .  H/O radioactive iodine thyroid ablation     Past Surgical History   Procedure   Date   .  Abdominal hysterectomy    .  Cholecystectomy    .  Angioplasty  1993 or 1994    Family History   Problem  Relation  Age of Onset   .  Diabetes  Mother    .  Diabetes  Sister    .  Diabetes  Brother     Social History: widowed. Works as an Environmental health practitioner at [employer]. She reports that she has quit smoking. She does not have any smokeless tobacco history on file. She reports that she does not drink alcohol or use illicit drugs.  Allergies   Allergen  Reactions   .  Sulfa Drugs Cross Reactors  Hives    No prescriptions prior to admission  Was started on synthroid past ablation--didn't follow through.   Home:  Home Living  Lives With: Alone  Available Help at Discharge: (Daughter from [place] here now and to take FMLA)  Type of Home: House  Home Access: Level entry  Home Layout: Two level  Alternate Level Stairs-Number of Steps: 14  Alternate Level Stairs-Rails: Right;Left;Can reach both  Bathroom Shower/Tub: Tub/shower unit;Curtain  Firefighter: Standard  Home Adaptive Equipment: Grab bars in shower  Functional History:  Prior Function  Able to Take Stairs?: Yes  Driving: Yes  Vocation: Electronics engineer)  Comments: Plans to retire sept. 2013  Functional Status:  Mobility:  Bed Mobility  Bed Mobility: Supine to Sit;Sitting - Scoot to Edge of Bed;Sit to Supine  Supine to Sit: 7: Independent  Sitting - Scoot to Edge of Bed: 7: Independent  Sit to  Supine: 7: Independent  Transfers  Transfers: Sit to Stand;Stand to Sit  Sit to Stand: 5: Supervision  Stand to Sit: 5: Supervision  Ambulation/Gait  Ambulation/Gait Assistance: 1: +2 Total assist  Ambulation/Gait: Patient Percentage: 60%  Ambulation Distance (Feet): 90 Feet  Assistive device: 2 person hand held assist  Ambulation/Gait Assistance Details: pt with heavy list to the L. Holds to therapist with Right UE. Gait characterized by scissoring and stumbling trying to recover balance  Gait Pattern:  Step-through pattern;Scissoring  Stairs: No   ADL:  ADL  Upper Body Dressing: Performed;Set up  Where Assessed - Upper Body Dressing: Unsupported sitting  Lower Body Dressing: Performed;Min guard  Where Assessed - Lower Body Dressing: Sopported sit to stand  Toilet Transfer: Simulated;+2 Total assistance  Toilet Transfer Method: (ambulating)  Toilet Transfer Equipment: (bed)  Equipment Used: Gait belt  Transfers/Ambulation Related to ADLs: Pt ambulated ~90 ft with +2 hand held assist due to heavy left side lean. Pt with scissoring and stumbling during ambulation.  ADL Comments: Pt able to perform EOB ADLs at setup level but requires physical assist for all standing components of ADLs due to decreased balance.  Cognition:  Cognition  Overall Cognitive Status: Appears within functional limits for tasks assessed  Arousal/Alertness: Awake/alert  Orientation Level: Oriented X4  Attention: Sustained  Memory: Appears intact  Problem Solving: Appears intact  Executive Function: (tba)  Safety/Judgment: Appears intact  Cognition  Overall Cognitive Status: Appears within functional limits for tasks assessed/performed  Arousal/Alertness: Awake/alert  Orientation Level: Appears intact for tasks assessed  Behavior During Session: [facility] for tasks performed  Blood pressure 162/103, pulse 58, temperature 97.3 F (36.3 C), temperature source Oral, resp. rate 16, height 5\' 1"  (1.549 m), weight 58.1 kg (128 lb 1.4 oz), SpO2 98.00%.  Physical Exam  Nursing note and vitals reviewed.  Constitutional: She is oriented to person, place, and time. She appears well-developed and well-nourished.  HENT: PERRL, EOMI Head: Normocephalic and atraumatic.  Eyes: Pupils are equal, round, and reactive to light.  Neck: Normal range of motion. Neck supple.  Cardiovascular: Normal rate and regular rhythm. No murmurs rubs or gallops Pulmonary/Chest: Effort normal and breath sounds normal. No wheezes rales or  rhonchi Abdominal: Soft. Bowel sounds are normal. Non-distended Musculoskeletal: Normal range of motion. She exhibits no edema and no tenderness.  Neurological: She is alert and oriented to person, place, and time. Coordination abnormal.  Follows commands without difficulty. Good memory, insight, and awareness. Mild ataxia BUE and LE's.  Speech mildly dysarthric. dyscongugate gaze worse with gaze to the right. Strength grossly 4/5. No sensory changes are seen.  Skin: Skin is warm and dry.  Psychiatric: She has a normal mood and affect. Her behavior is normal. Judgment and thought content normal.   OCCULT BLOOD X 1 CARD TO LAB, STOOL Status: Normal    Collection Time    07/04/11 11:26 AM   Component  Value  Range  Comment    Fecal Occult Bld  POSITIVE     CBC Status: Abnormal    Collection Time    07/05/11 6:30 AM   Component  Value  Range  Comment    WBC  5.3  4.0 - 10.5 (K/uL)     RBC  3.51 (*)  3.87 - 5.11 (MIL/uL)     Hemoglobin  10.7 (*)  12.0 - 15.0 (g/dL)     HCT  45.4 (*)  09.8 - 46.0 (%)     MCV  83.8  78.0 -  100.0 (fL)     MCH  30.5  26.0 - 34.0 (pg)     MCHC  36.4 (*)  30.0 - 36.0 (g/dL)     RDW  11.9  14.7 - 15.5 (%)     Platelets  152  150 - 400 (K/uL)    BASIC METABOLIC PANEL Status: Abnormal    Collection Time    07/05/11 6:30 AM   Component  Value  Range  Comment    Sodium  138  135 - 145 (mEq/L)     Potassium  3.1 (*)  3.5 - 5.1 (mEq/L)     Chloride  101  96 - 112 (mEq/L)     CO2  26  19 - 32 (mEq/L)     Glucose, Bld  96  70 - 99 (mg/dL)     BUN  11  6 - 23 (mg/dL)     Creatinine, Ser  8.29  0.50 - 1.10 (mg/dL)     Calcium  9.2  8.4 - 10.5 (mg/dL)     GFR calc non Af Amer  67 (*)  >90 (mL/min)     GFR calc Af Amer  77 (*)  >90 (mL/min)    Post Admission Physician Evaluation:  1. Functional deficits secondary to right superior vermis infarct with IVH. Also with small right thalamus and superior peduncle infarcts ----(hypertensive). 2. Patient is admitted to receive  collaborative, interdisciplinary care between the physiatrist, rehab nursing staff, and therapy team. 3. Patient's level of medical complexity and substantial therapy needs in context of that medical necessity cannot be provided at a lesser intensity of care such as a SNF. 4. Patient has experienced substantial functional loss from his/her baseline which was documented above under the "Functional History" and "Functional Status" headings. Judging by the patient's diagnosis, physical exam, and functional history, the patient has potential for functional progress which will result in measurable gains while on inpatient rehab. These gains will be of substantial and practical use upon discharge in facilitating mobility and self-care at the household level. 5. Physiatrist will provide 24 hour management of medical needs as well as oversight of the therapy plan/treatment and provide guidance as appropriate regarding the interaction of the two. 6. 24 hour rehab nursing will assist with bladder management, bowel management, safety, skin/wound care, disease management, medication administration, pain management and patient education and help integrate therapy concepts, techniques,education, etc. 7. PT will assess and treat for: LES, NMR, safety, fxnl mobility, balance, . Goals are: mod I to supervision. 8. OT will assess and treat for: UES, NMR, ADL''s, safety, AET, fxnl mobility. Goals are: mod I to supervision. 9. SLP will assess and treat for: speech intelligibility and communication. Goals are: mod I. 10. Case Management and Social Worker will assess and treat for psychological issues and discharge planning. 11. Team conference will be held weekly to assess progress toward goals and to determine barriers to discharge. 12. Patient will receive at least 3 hours of therapy per day at least 5 days per week. 13. ELOS: 10-14 days Prognosis: good Medical Problem List and Plan:  1. DVT Prophylaxis/Anticoagulation:  Mechanical: Sequential compression devices, below knee Bilateral lower extremities  2. Pain Management: ultram prn for pain.  3. Mood: Monitor for now. LCSW to follow up for formal evalutation.  4. HTN: Continue qid checks for now as BP still labile. Will continue to slowly adjust medication for better control. On norvasc, chlorthalidone and metoprolol,  5. Dyslipidemia: Continue Lipitor.  6. Hypothyroid: On  supplement.  7. Hypokalemia: Likely due to diuretics. Increase K+ supplement and recheck on Monday.  8. Heme positive stool: CT with question of colitis. Patient without any GI symptoms currently. Will monitor with serial CBC and check a couple more specimens for recurrence v/s constipation related.   Ivory Broad, MD 07/05/11

## 2011-07-05 NOTE — Progress Notes (Signed)
Internal Medicine Teaching Service Attending Note Date: 07/05/2011  Patient name: Jacqueline King  Medical record number: [id number]  Date of birth: [birth year]    This patient has been seen and discussed with the house staff. Please see their note for complete details. 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 We reviewed CT scan findings with Dr. Lowella Dandy with radiology. For now we will plan on trying to maximize medical management of her BP. I do think she deserves angiography to try to peform angioplasty and improve her BP (she apparently had dramatic response the last time 20_+ years ago). Issue with doing angioplasty now would be that we could not use anticoagulation with her recent hemorrhagic CVA and without using anticoagulation there would be less ability to attenuate risk of clot embolization during angiography. Theferore will delay angiography for at least a month. Will discuss with [facility] when they would feel comfortable having her underog this procedure timing wise. Otherwise I think she should be ready to go to Rehab floor. Will discuss plan with rehab team.  [name] 07/05/2011, 12:26 PM

## 2011-07-05 NOTE — Discharge Summary (Signed)
Internal Medicine Teaching [facility] Discharge Note  Name: Jacqueline King MRN: [id number] DOB: 02/23/1945 66 y.o.  Date of Admission: 07/01/2011 11:58 AM Date of Discharge: 07/05/2011 Attending Physician: Randall Hiss, MD  Discharge Diagnosis: Active Problems:  1. 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  3. Pre- diabetes  4. Hyperlipidemia.  5. 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 Hypokalemia.  7. Normocytic Normochromic Anemia  with iron -43, ferritin-90.   Discharge Medications: Medication List    Notice       You have not been prescribed any medications.             Disposition and follow-up:   Jacqueline King was discharged from [facility] in Stable condition.  Family wants to choose PCP by  Themselves - do not want to follow in resident clinic. At the hospital follow up visit please address- 1.TSH recheck in 4 weeks from d/c on 07/05/11                  2. BMET to follow up on hypokalemia                 3. AIC in 3 months  Follow-up Appointments: Follow-up Information    Follow up with Gates Rigg, MD. Schedule an appointment as soon as possible for a visit in 2 months. (stroke doctor)    Contact information:   [address] [facility] [place] [address] [phone]           Consultations:  1.Neurology for Hypertensive emergency with acute right superior vermis hemorrhage with intraventricular extension  Procedures Performed:  Dg Chest 2 View  07/04/2011  *RADIOLOGY REPORT*  Clinical Data: 66 year old female with weakness, shortness of breath, left pleural effusion.  CHEST - 2 VIEW  Comparison: 07/01/2011.  Findings: Continued low lung volumes.  Small bilateral pleural effusions.   Associated patchy basilar airspace disease may be greater on the left.  No pneumothorax or pulmonary edema.  The upper lobes are clear.  Cardiac size and mediastinal contours are within normal limits.  Visualized tracheal air column is within normal limits.  No acute osseous abnormality identified.  Right upper quadrant surgical clips.  IMPRESSION: Low lung volumes with small bilateral pleural effusions. Associated patchy bibasilar opacity, favor atelectasis.  Original Report Authenticated By: Harley Hallmark, M.D.   Ct Head Wo Contrast  07/02/2011  *RADIOLOGY REPORT*  Clinical Data: Intracranial hemorrhage follow-up.  CT HEAD WITHOUT CONTRAST  Technique:  Contiguous axial images were obtained from the base of the skull through the vertex without contrast.  Comparison: 07/01/2011 MR brain and CT.  Findings: Hyperdense material centered at the right superior cerebellar peduncle/fourth ventricle once again noted.  Question if this represents hemorrhage into a cavernoma?  Slight decrease density of blood noted within the aqueduct extending into the third ventricle.  Multiple remote infarcts and small vessel disease type changes as noted on MR.  The MR questioned new small acute infarct of the medial right thalamus and the superior cerebral peduncle are not as well appreciated on the present CT.  IMPRESSION: Hyperdense material centered at the right superior cerebellar peduncle/fourth ventricle once again noted.  Question if this represents hemorrhage into a cavernoma as versus hemorrhagic infarct as suggested on recent MR report?  Slight decrease density of blood noted within the aqueduct extending into the third ventricle.  Multiple remote infarcts and small vessel disease type changes as noted on MR.  The MR questioned new small medial right thalamus is not as well appreciated on the present CT.  Original Report Authenticated By: Fuller Canada, M.D.   Ct Head Wo Contrast  07/01/2011  *RADIOLOGY REPORT*  Clinical  Data: Near syncope  CT HEAD WITHOUT CONTRAST  Technique:  Contiguous axial images were obtained from the base of the skull through the vertex without contrast.  Comparison: None.  Findings: There is asymmetric curvilinear high density along the right aspect of the fourth ventricle on image number 13.  Linear high density projects within the central portion of the fourth ventricle and third ventricle on images 12 and 13.  This finding could reflect benign prominent choroid plexus, however a small amount of acute hemorrhage is difficult to exclude.  There are no prior studies for comparison.  There is patchy hypodensity in the periventricular white matter bilaterally suggesting chronic microvascular ischemic changes.  The ventricles are normal in size.  No evidence of acute cortically based infarction.  Negative for mass effect or midline shift.  The visualized paranasal sinuses, mastoid air cells, and middle ears are clear.  The mastoid air cells are clear.  The skull is intact.  Soft tissues of the scalp are symmetric.  IMPRESSION: 1. A small amount of acute intraventricular hemorrhage cannot be excluded in the third and fourth ventricles.  Alternatively, this high density could reflect prominent choroid plexus.  Further evaluation of the brain with and without contrast is suggested. Critical Value/emergent results were called by telephone at the time of interpretation on 07/01/2011  at 3:05 p.m.  to  Dr. Fonnie Jarvis, who verbally acknowledged these results. 2.  Chronic microvascular ischemic changes.  Original Report Authenticated By: Britta Mccreedy, M.D.   Mr Brain Wo Contrast  07/01/2011  *RADIOLOGY REPORT*  Clinical Data: Dizzy with nausea.  Hypertension.  MRI HEAD WITHOUT CONTRAST  Technique:  Multiplanar, multiecho pulse sequences of the brain and surrounding structures were obtained according to standard protocol without intravenous contrast.  Comparison: 07/01/2011 earlier today.  Findings:  There is an acute 9 x  11 mm cross-section hemorrhage involving the right superior vermis/right superior cerebellar peduncle/dorsolateral recess of the fourth ventricle with intraventricular extension. There is some extension into the aqueduct and posterior third ventricle.  No subarachnoid blood. There is a 5 mm area of restricted diffusion immediately adjacent to the hemorrhage suggesting superimposed acute infarction. A 2 mm area of restricted diffusion affects the right thalamus suggesting a second area of acute infarction.  There is atrophy with extensive chronic microvascular ischemic change.  Gradient sequences demonstrate numerous right cerebellar, bilateral thalamic, and right superior temporal foci of hemorrhage representing either hypertensive cerebrovascular disease or cerebral amyloid angiopathy. Large lacunar infarcts are noted in the lower midbrain on the right and left as well as in the periventricular white matter.  Major intracranial vascular structures patent.  Chronic sinus disease affects the left maxillary region.  Compared with the prior CT, the appearance is similar.  IMPRESSION: 9 x 11 mm acute hemorrhage involves the right superior vermis with intraventricular extension; small foci of acute infarction also noted in the right medial thalamus and superior cerebellar peduncle.  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 There is no subarachnoid blood to suggest  posterior circulation aneurysm.  A periventricular cavernoma which has acutely expanded could have this appearance although  I have no previous imaging for comparison; follow-up scanning recommended.  Multiple foci of chronic infarction and chronic hemorrhage are also noted bilaterally as described above.  Findings discussed with EDP.  Original Report Authenticated By: Elsie Stain, M.D.   Dg Chest Port 1 View  07/01/2011  *RADIOLOGY REPORT*  Clinical Data: Dizzy, nausea  PORTABLE CHEST - 1 VIEW  Comparison: None.  Findings: Mild  cardiomegaly.  Suspect left pleural effusion. Atelectasis or infiltrate at the left lung base.  Low lung volumes with resultant crowding of bronchovascular structures.  Regional bones unremarkable.  IMPRESSION:  1.  Probable left pleural effusion and left lower lung infiltrate or atelectasis. 2.  Mild cardiomegaly.  Original Report Authenticated By: Osa Craver, M.D.   Ct Angio Abd/pel W/ And/or W/o  07/04/2011  *RADIOLOGY REPORT*  Clinical Data: Uncontrolled hypertension and evaluate for renal artery stenosis.  CT ANGIOGRAPHY ABDOMEN AND PELVIS WITH CONTRAST AND WITHOUT CONTRAST  Technique:  Multidetector CT imaging of the abdomen was performed using the standard protocol during bolus administration of intravenous contrast. Multiplanar reconstructed images including MIPs were obtained and reviewed to evaluate the vascular anatomy.  Comparison: 100 ml Omnipaque 350  Findings:  Arterial structures: The abdominal aorta is patent without aneurysmal dilatation. There is plaque in the common iliac arteries bilaterally, right side greater than left.  The celiac trunk, SMA, bilateral renal arteries and IMA are patent.  The renal arteries appear to be widely patent but there is irregularity of the mid and distal right renal artery.  This may represent areas of ectasia. In addition, there is a very outpouching involving the left renal artery that extends into the upper pole.  This is best seen on sequence #9, image 58.  This could represent a small aneurysm formation and roughly measuring 3 mm.  The superior mesenteric artery appears to be folded or kinked in the proximal aspect but distal branches are patent.  Some of the superior mesenteric artery branches may have a beaded appearance.  Nonvascular structures:  There is a compressive atelectasis at both lung bases. There are tiny bilateral pleural effusions.  The gallbladder been removed.  There is a round 1.1 cm low density structure in the caudate lobe of the  liver which is most compatible with a cyst.  Few small lymph nodes in the cardiophrenic fat along the right side.  In addition, there is pericardial fluid along the inferior aspect of the heart.  No gross abnormality to the spleen, pancreas, adrenal glands or kidneys.  There is diffuse wall thickening of the rectum and the sigmoid colon. There may be mild wall thickening throughout the colon. Tiny amount of free fluid along the right side of the pelvis.  There is no significant abdominal or pelvic lymphadenopathy. Uterus has been removed.  There is leftward scoliosis of the thoracolumbar spine.  Focal sclerotic area along the superior aspect of the L5 vertebral body probably represents a bone island.  IMPRESSION: There is concern for vessel irregularity involving the mid and distal right renal artery and there may be a tiny aneurysm involving a left renal artery branch.  In addition, the superior mesenteric artery has areas of kinking and mild ectasia.  The findings raise the possibility of fibromuscular dysplasia.  This finding could be further evaluated with catheter angiography.  Diffuse wall thickening of the colon, particularly the rectum and sigmoid colon.   Findings are suspicious for colitis.  Small-moderate sized pericardial fluid.  Tiny pleural effusions and compressive  atelectasis.  Original Report Authenticated By: Richarda Overlie, M.D.    2D Echo: Left ventricle: The cavity size was normal. Wall thickness was increased in a pattern of mild LVH. There is dysproportionate upper septal thickening (DUST). Systolicfunction was normal. The estimated ejection fraction was in the range of 55% to 60%. Wall motion was normal; there were no regional wall motion abnormalities. Features are consistent with a pseudonormal left ventricular filling pattern, with concomitant abnormal relaxation and increased filling pressure (grade 2 diastolic dysfunction). Doppler parameters are consistent with elevated mean left atrial  filling pressure. - Mitral valve: Mild regurgitation. - Pericardium, extracardiac: A trivial pericardial effusion was identified.    Cardiac Cath: None  Admission HPI: 66 year old female with a past medical history significant for thyroid ablation in 2006/07, has not been following up with any health care providers for last 4-5 years who presented to the  ED reporting feeling of dizziness and a sensation that the room was moving and she was feeling like she was going to pass out so she lowered herself to the ground. Patient reports that when she closes her eyes the dizziness seems to be better. The patient denies chest pain, shortness of breath, abdominal pain, vomiting, visual changes, fever, headache, or dysuria. Patient does report nausea since the onset of her symptoms.  CT scan of the head showed equivocal high density abnormality involving third and fourth ventricles. Acute hemorrhage could be ruled out. Further evaluation with MRI showed acute hemorrhage involving the right superior cerebellar vermis with extension into fourth ventricle. There was also evidence of acute infarction involving the right medial thalamus and superior cerebellar peduncle.    Hospital Course by problem list:   1. Malignant hypertension with acute right vermis hemorrhage: Patient presented with blood pressure of 206/ 115 and was noted to have right superior cerebellar vermis hemorrhage with intraventricular extension on her MRI done on 07/01/11 in addition to an acute right medial thalamus infarct. She also had elevated CK- total and MB but negative troponin's and no EKG changes suggestive of ischemia. Neurology consult was obtained and patient was started on PRN labetalol to keep SBP goal<180.  She was requiring almost round-the-clock labetalol for initial 12-14 hours. Patient had a repeat CT scan on the next day that did not show any extension of her cerebral hemorrhage. Patient was not on any aspirin at  home and was started on daily aspirin for secondary stroke prevention. Her dizziness continued to improve gradually throughout her stay. PT/OT consult was obtained who recommended inpatient rehabilitation. She was also started on Lipitor 40 mg daily and anti-hypertensive regimen that included chlorthalidone, lisinopril , Norvasc, metoprolol at the time of her discharge. Her BP meds would need be titrated based on his BP trends.   2. 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 It is difficult to discern how long she has had HTN as she was not following up with any health care provider. Would first try to maximize the  medical therapy with the ACE- inhibitors and adding other agents. It seems like the patient might need PTA( Percutaneous transluminal angioplasty) for getting her BP control and given the history in the past  . Her imaging was discussed with radiology( Dr. Lowella Dandy) who recommended anticoagulation with the procedure(  PTA ) if indicated to attenuate the  risks of clot embolization with the procedure. However anticoagulation would be an issue given her recent cerebral hemorrhage. Her blood pressure was difficult to control throughout the hospital stay and she was discharged for 4 antihypertensive drugs to inpatient rehabilitation with titration of doses and agents recommended following the trend. Initially we were skeptical adding ACE-I because of the concern for Renal artery stenosis based on history but was added after stenosis was ruled out on CT - angiography  and also since ACE- I are beneficial in patients with fibromuscular dysplasia. Follow up on renal function and potassium is recommended with BMET's in every 1-2 days.  3. Hyperlipidemia: Her LDL was elevated to 208.  Patient was started on Lipitor 40 mg. Crestor would be a more potent drug for lowering her LDL but it wasn't available with the inpatient pharmacy. We may consider changing her to Crestor at the time of discharge.  4. Pre- diabetes-her A1c was 6.2 during this admission. Patient was counseled on diet and exercise. Recommend getting her A1c rechecked in 3 months at the outpatient followup.  5. Hypothyroidism secondary to radioiodine ablation for Grave' s disease in 2007: Her TSH was markedly elevated in the 40s during this inpatient stay. She was started on 25 mcg of Synthroid daily. Would recommend rechecking her TSH in 4 weeks for further adjustment in the dose of Synthroid.  6. Hypokalemia: Likely secondary to diuretics. Her potassium was repleted.   7. Normocytic Normochromic anemia: Iron -43 and ferritin of 90. Patient was FOBT positive x1. She states that she had colonoscopy in 2008 that was normal. She was noted to have some colitis on her CT angio but she is completely symptomatic without abdominal pain or diarrhea.at this time. Her Hb is stable between 10-11. Would recommend getting outpatient colonoscopy.   Discharge Vitals:  BP 155/92  Pulse 62  Temp(Src) 98 F (36.7 C) (Oral)  Resp 18  Ht 5\' 1"  (1.549 m)  Wt 128 lb 1.4 oz (58.1 kg)  BMI 24.20 kg/m2  SpO2 98%  Discharge Labs:  Results for orders placed during the hospital encounter of 07/01/11 (from the past 24 hour(s))  CBC     Status: Abnormal   Collection Time   07/05/11  6:30 AM      Component Value Range   WBC 5.3  4.0 - 10.5 (K/uL)   RBC 3.51 (*) 3.87 - 5.11 (MIL/uL)   Hemoglobin 10.7 (*) 12.0 - 15.0 (g/dL)   HCT 45.4 (*) 09.8 - 46.0 (%)   MCV 83.8  78.0 - 100.0 (fL)   MCH 30.5  26.0 - 34.0 (pg)   MCHC 36.4 (*) 30.0 - 36.0 (g/dL)   RDW 11.9  14.7 - 82.9 (%)   Platelets 152  150 - 400 (K/uL)  BASIC METABOLIC PANEL     Status: Abnormal   Collection Time   07/05/11  6:30 AM      Component Value Range   Sodium 138  135  - 145 (mEq/L)   Potassium 3.1 (*) 3.5 - 5.1 (mEq/L)   Chloride 101  96 - 112 (mEq/L)   CO2 26  19 - 32 (mEq/L)   Glucose, Bld 96  70 - 99 (mg/dL)   BUN 11  6 - 23 (mg/dL)   Creatinine, Ser 5.62  0.50 - 1.10 (mg/dL)   Calcium 9.2  8.4 - 13.0 (mg/dL)   GFR calc non Af Amer 67 (*) >90 (mL/min)   GFR calc Af Amer 77 (*) >90 (mL/min)  Signed: Ambrosia Wisnewski 07/05/2011, 2:34 PM   Time Spent on Discharge: >30 minutes

## 2011-07-05 NOTE — Progress Notes (Signed)
Stroke Team Progress Note  HISTORY Jacqueline King is an 66 y.o. female experienced acute onset of severe vertigo with associated nausea and near syncopal symptoms while at work early this afternoon 07/01/2011. She has not experienced diplopia. She had no focal weakness or numbness. She also has not experienced headache. She was noted to have nystagmus in all directions of gaze on arrival in the emergency room with nausea as well. No focal deficits were noted. Patient indicated her speech is slightly slurred but no other neurologic changes. CT scan of the head showed equivocal high density abnormality involving third and fourth ventricles. Acute hemorrhage could be ruled out. Further evaluation with MRI showed acute hemorrhage involving the right superior cerebellar vermis with extension into fourth ventricle. There was also evidence of acute infarction involving the right medial thalamus and superior cerebellar peduncle. Atrophy as well as extensive microvascular ischemic changes as noted. Lacunar infarctions were noted in the lower midbrain on the right and left as well as and peri-ventricular white matter. Gradient sequences demonstrate numerous right cerebellar, bilateral thalamic and superior temporal foci of hemorrhage, indicating possible underlying amyloid angiopathy. Patient's symptoms of vertigo improved after arriving in the emergency room. Patient was not a TPA candidate secondary to hemorrhage. She was admitted to the neuro ICU for further evaluation and treatment.  SUBJECTIVE Daughter and son at the bedside. Agreeable to plans for discharge to rehab today.  OBJECTIVE Most recent Vital Signs: Filed Vitals:   07/04/11 1448 07/04/11 2030 07/05/11 0514 07/05/11 0810  BP: 155/87 142/99 180/97 162/103  Pulse:  63 65 58  Temp:  98.2 F (36.8 C) 97.3 F (36.3 C)   TempSrc:  Oral Oral   Resp:  16 16   Height:      Weight:  58.1 kg (128 lb 1.4 oz)    SpO2:  98% 98%    CBG (last 3)  No results  found for this basename: GLUCAP:3 in the last 72 hours Intake/Output from previous day: 06/06 0701 - 06/07 0700 In: 720 [P.O.:720] Out: -   IV Fluid Intake:     MEDICATIONS    . amLODipine  5 mg Oral Daily  . atorvastatin  40 mg Oral q1800  . chlorthalidone  25 mg Oral Daily  . levothyroxine  25 mcg Oral QAC breakfast  . metoprolol succinate  25 mg Oral Daily  . pantoprazole  40 mg Oral QHS  . senna-docusate  1 tablet Oral BID  . DISCONTD: amLODipine  10 mg Oral Daily  . DISCONTD: amLODipine  5 mg Oral Daily  . DISCONTD: atorvastatin  20 mg Oral q1800  . DISCONTD: hydrochlorothiazide  25 mg Oral Daily   PRN:  acetaminophen, acetaminophen, hydrALAZINE, iohexol, morphine injection, ondansetron (ZOFRAN) IV, senna-docusate, traMADol, DISCONTD: labetalol  Diet:  Sodium Restricted Activity:  As tolerated DVT Prophylaxis:  SCDs   CLINICALLY SIGNIFICANT STUDIES Basic Metabolic Panel:  Lab 07/05/11 7829 07/04/11 0630  NA 138 138  K 3.1* 3.3*  CL 101 104  CO2 26 23  GLUCOSE 96 98  BUN 11 10  CREATININE 0.89 0.87  CALCIUM 9.2 8.7  MG -- --  PHOS -- --   Liver Function Tests:  Lab 07/03/11 0913 07/01/11 1342  AST 53* 44*  ALT 38* 30  ALKPHOS 41 43  BILITOT 0.3 0.3  PROT 6.7 7.4  ALBUMIN 3.5 4.0   CBC:  Lab 07/05/11 0630 07/04/11 0630 07/01/11 1250  WBC 5.3 6.2 --  NEUTROABS -- -- 3.7  HGB 10.7*  10.8* --  HCT 29.4* 29.6* --  MCV 83.8 85.1 --  PLT 152 145* --   Coagulation:  Lab 07/01/11 1341  LABPROT 13.4  INR 1.00   Cardiac Enzymes:  Lab 07/02/11 0914 07/02/11 0212 07/01/11 1746  CKTOTAL 969* 982* 1074*  CKMB 9.9* 9.7* 11.8*  CKMBINDEX -- -- --  TROPONINI <0.30 <0.30 <0.30   Urinalysis:  Lab 07/01/11 1318  COLORURINE YELLOW  LABSPEC 1.012  PHURINE 7.0  GLUCOSEU NEGATIVE  HGBUR NEGATIVE  BILIRUBINUR NEGATIVE  KETONESUR NEGATIVE  PROTEINUR 30*  UROBILINOGEN 0.2  NITRITE NEGATIVE  LEUKOCYTESUR TRACE*   Lipid Panel    Component Value Date/Time    CHOL 329* 07/02/2011 0211   TRIG 52 07/02/2011 0211   HDL 111 07/02/2011 0211   CHOLHDL 3.0 07/02/2011 0211   VLDL 10 07/02/2011 0211   LDLCALC 208* 07/02/2011 0211   HgbA1C  Lab Results  Component Value Date   HGBA1C 6.2* 07/01/2011   Urine Drug Screen:     Component Value Date/Time   LABOPIA NONE DETECTED 07/01/2011 1336   COCAINSCRNUR NONE DETECTED 07/01/2011 1336   LABBENZ NONE DETECTED 07/01/2011 1336   AMPHETMU NONE DETECTED 07/01/2011 1336   THCU NONE DETECTED 07/01/2011 1336   LABBARB NONE DETECTED 07/01/2011 1336   Thyroid Function Tests:  Lab 07/01/11 1746  TSH 41.678*  T4TOTAL --  FREET4 --  T3FREE --  THYROIDAB --   Anemia Panel:  Lab 07/01/11 1746  VITAMINB12 511  FOLATE 17.5  FERRITIN 90  TIBC 378  IRON 43  RETICCTPCT 1.4   CT of the brain   07/02/11 - Stable compared to 07/01/11. Hyperdense material centered at the right superior cerebellar peduncle/fourth ventricle once again noted. Question if this represents hemorrhage into a cavernoma as versus hemorrhagic infarct as suggested on recent MR report? Slight decrease density of blood noted within the aqueduct extending into the third ventricle. Multiple remote infarcts and small vessel disease type changes as noted on MR. 07/01/2011  - A small amount of acute intraventricular hemorrhage cannot be excluded in the third and fourth ventricles.  Alternatively, this high density could reflect prominent choroid plexus.  Further evaluation of the brain with and without contrast is suggested. Critical Value/emergent results were called by telephone at the time of interpretation on 07/01/2011  at 3:05 p.m.  to  Dr. Fonnie Jarvis, who verbally acknowledged these results. 2.  Chronic microvascular ischemic changes.  MRI of the brain  07/01/2011  9 x 11 mm acute hemorrhage involves the right superior vermis with intraventricular extension; small foci of acute infarction also noted in the right medial thalamus and superior cerebellar peduncle.  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 There is no subarachnoid blood to suggest  posterior circulation aneurysm.  A periventricular cavernoma which has acutely expanded could have this appearance although I have no previous imaging for comparison; follow-up scanning recommended.  Multiple foci of chronic infarction and chronic hemorrhage are also noted bilaterally as described above.  Findings discussed with EDP.   MRA of the brain not done  2D Echocardiogram 07/02/11 - estimated ejection fraction was in the range of 55% to 60%. Wall motion was normal  Carotid Doppler not done   TCD This was a normal transcranial Doppler study of the identified vessels in the anterior circulation with no evidence of stenosis, vasospasm, or occlusion.Low posterior circulation mean flow velocities are likely from suboptimal windows from poor occipital window.   CXR  07/01/2011   1.  Probable left  pleural effusion and left lower lung infiltrate or atelectasis. 2.  Mild cardiomegaly.   EKG  normal sinus rhythm.   Therapy Recommendations PT -CIR, OT -CIR  Physical Exam   Pleasant middle-aged African American lady currently not in distress.Awake alert. Afebrile. Head is nontraumatic. Neck is supple without bruit. Hearing is normal. Cardiac exam no murmur or gallop. Lungs are clear to auscultation. Distal pulses are well felt. Pedal and hand edema bilaterally. Neurological Exam Awake and alert. Normal speech and language.eye movements full without nystagmus. Visual acuity is adequate. Fundi were not visualized. Follows commands very well. Face symmetric. Tongue midline. MILD DYSARTHRIA. Normal strength, tone, reflexes and coordination, . Normal sensation. Gait deferred.   ASSESSMENT [name] is a 66 y.o. female with an acute right superior vermis hemorrhage with intraventricular extension.  Hemorrhage secondary to hypertension. 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 On no antiplatelets prior to admission. Now on no antiplatelets given hemorrhage. Patient with resultant left leg hemiparesis that is improving. Dizziness fluctuating.  -malignant hypertension, not on medications prior to admission, BP 206/104  -hyperlipidemia, LDL 208 -hyperglycemia, HgbA1c 6.2 -elevated cardiac enzymes -pedal edema  Hospital day # 4  TREATMENT/PLAN RECOMMENDATIONS -agreeable to transfer to inpatient rehab later this afternoon -Stroke Service will sign off. Follow up with Dr. Pearlean Brownie in 2 months. -no driving until follow up with Dr. Konrad Felix, AVNP, ANP-BC, GNP-BC [facility] Pager: [phone] 07/05/2011 8:16 AM  Scribe for Dr. Delia Heady, Stroke Center Medical Director. He has personally reviewed chart, pertinent data, examined the patient and developed the plan of care. Pager:  [phone]

## 2011-07-06 DIAGNOSIS — Z5189 Encounter for other specified aftercare: Secondary | ICD-10-CM

## 2011-07-06 DIAGNOSIS — I69993 Ataxia following unspecified cerebrovascular disease: Secondary | ICD-10-CM

## 2011-07-06 DIAGNOSIS — I6330 Cerebral infarction due to thrombosis of unspecified cerebral artery: Secondary | ICD-10-CM

## 2011-07-06 NOTE — Progress Notes (Signed)
HPI: Jacqueline King is a 66 y.o. right handed female with history of uncontrolled HTN, admitted on 07/01/11 with dizziness and nausea. Patient with malignant HTN with BP at 200/100. She was noted to have nystagmus, ataxia and nausea on evaluation in ED. 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 2 D echo done revealed mild LVH with EF 55-65%. Carotid dopplers without ICA stenosis. Evaluated by neurology who reducing stroke risk factors with control of lipids and optimum BP control. CTA abdomen done for work up of uncontrolled HTN and revealed vessel irregularity of mid and distal right renal artery with ectasis SMA, small-moderate size pericardial fluid and diffuse wall thickening of rectal and sigmoid colon suspicious for colitis.   She has no specific complaints today  BP 128/81  Pulse 60  Temp(Src) 98.2 F (36.8 C) (Oral)  Resp 20  Ht 5.1" (0.13 m)  Wt 129 lb 3 oz (58.6 kg)  SpO2 96%   Well-developed well-nourished female in no acute distress. HEENT exam atraumatic, normocephalic, extraocular muscles are intact. Neck is supple. No jugular venous distention no thyromegaly. Chest clear to auscultation without increased work of breathing. Cardiac exam S1 and S2 are regular. Abdominal exam active bowel sounds, soft, nontender. Extremities no edema.  Post Admission Physician Evaluation:  1. Functional deficits secondary to right superior vermis infarct with IVH. Also with small right thalamus and superior peduncle infarcts ----(hypertensive). 2. Patient is admitted to receive collaborative, interdisciplinary care between the physiatrist, rehab nursing staff, and therapy team. 3. Patient's level of medical complexity and substantial therapy needs in context of that medical necessity cannot be provided at a lesser  intensity of care such as a SNF. 4. Patient has experienced substantial functional loss from his/her baseline which was documented above under the "Functional History" and "Functional Status" headings. Judging by the patient's diagnosis, physical exam, and functional history, the patient has potential for functional progress which will result in measurable gains while on inpatient rehab. These gains will be of substantial and practical use upon discharge in facilitating mobility and self-care at the household level. 5. Physiatrist will provide 24 hour management of medical needs as well as oversight of the therapy plan/treatment and provide guidance as appropriate regarding the interaction of the two. 6. 24 hour rehab nursing will assist with bladder management, bowel management, safety, skin/wound care, disease management, medication administration, pain management and patient education and help integrate therapy concepts, techniques,education, etc. 7. PT will assess and treat for: LES, NMR, safety, fxnl mobility, balance, . Goals are: mod I to supervision. 8. OT will assess and treat for: UES, NMR, ADL''s, safety, AET, fxnl mobility. Goals are: mod I to supervision. 9. SLP will assess and treat for: speech intelligibility and communication. Goals are: mod I. 10. Case Management and Social Worker will assess and treat for psychological issues and discharge planning. 11. Team conference will be held weekly to assess progress toward goals and to determine barriers to discharge. 12. Patient will receive at least 3 hours of therapy per day at least 5 days per week. 13. ELOS: 10-14 days Prognosis: good Medical Problem List and Plan:  1. DVT Prophylaxis/Anticoagulation: Mechanical: Sequential compression devices, below knee Bilateral lower extremities  2. Pain Management: ultram prn for pain.  3. Mood: Monitor for now. LCSW to follow up for formal evalutation.  4.  HTN: Continue qid checks for now as BP  still labile. Will continue to slowly adjust medication for better control. On norvasc, chlorthalidone and metoprolol,  BP Readings from Last 3 Encounters:  07/06/11 128/81  07/05/11 155/92  will continue to monitor 5. Dyslipidemia: Continue Lipitor.  6. Hypothyroid: On supplement.  7. Hypokalemia: Likely due to diuretics. Increase K+ supplement and recheck on Monday.  Basic Metabolic Panel:    Component Value Date/Time   NA 138 07/05/2011 0630   K 3.1* 07/05/2011 0630   CL 101 07/05/2011 0630   CO2 26 07/05/2011 0630   BUN 11 07/05/2011 0630   CREATININE 0.89 07/05/2011 0630   GLUCOSE 96 07/05/2011 0630   CALCIUM 9.2 07/05/2011 0630    8. Heme positive stool: CT with question of colitis. Patient without any GI symptoms currently. Will monitor with serial CBC and check a couple more specimens for recurrence v/s constipation related.

## 2011-07-06 NOTE — Evaluation (Signed)
Speech Language Pathology Assessment and Plan  Patient Details  Name: Jacqueline King MRN: [id number] Date of Birth: 1945/04/12  SLP Diagnosis: Dysarthria & Suspected high level cognitive deficit Rehab Potential: Good ELOS: 7-10 days   Today's Date: 07/06/2011 Time: 0900-0950 Time Calculation (min): 50 min  Problem List:  Patient Active Problem List  Diagnoses  . Cerebral hemorrhage  . CVA (cerebral infarction)  . HTN (hypertension)  . Hypothyroidism following radioiodine therapy   Past Medical History:  Past Medical History  Diagnosis Date  . Hypertension   . H/O radioactive iodine thyroid ablation    Past Surgical History:  Past Surgical History  Procedure Date  . Abdominal hysterectomy   . Cholecystectomy   . Renal artery angioplasty 1993 or 1994    Assessment / Plan / Recommendation Clinical Impression  Jacqueline King is a 66 y.o. right handed female with history of uncontrolled HTN, admitted on 07/01/11 with dizziness and nausea. 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 Transferred to CIR 07/05/11 and upon evaluation today presents with mild ataxic dysarthria characterized by decreased coordination during conversational level verbal expression as well as high level cognitive deficits characterized by decrease recall of new information and high level problem solving.  SLP suspects some of these high level deficits to be baseline; however, will continue to assess while treating mild dysarthria. As a result, this patient would benefit from skilled SLP services to address above deficits and maximize functional independence upon discharge.     SLP Assessment  Patient will need skilled Speech Lanaguage Pathology Services during CIR admission    Recommendations  Follow up Recommendations:  None Equipment Recommended: None recommended by SLP    SLP Frequency 1-2 X/day, 30-60 minutes;5 out of 7 days   SLP Treatment/Interventions Cognitive remediation/compensation;Cueing hierarchy;Functional tasks;Internal/external aids;Medication managment;Patient/family education;Therapeutic Activities;Therapeutic Exercise;Speech/Language facilitation    Pain Pain Assessment Pain Assessment: No/denies pain Pain Score: 0-No pain Prior Functioning Cognitive/Linguistic Baseline: Within functional limits Type of Home: Other (Comment) (condominium) Lives With: Alone Available Help at Discharge: Family (Daughter from [place] here now to take [employer]) 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[employer])  Short Term Goals: Week 1: SLP Short Term Goal 1 (Week 1): Patient will utilize compensatory strategies at the conversational level of verbal expression with subtle cues to maintain 98% intelligibility SLP Short Term Goal 2 (Week 1): Patient will utilize memory compensatory aids to recall daily information with modified independence SLP Short Term Goal 3 (Week 1): Patient will demonstrate high level problem solving during functional tasks with suble cues  See FIM for current functional status Refer to Care Plan for Long Term Goals  Recommendations for other services: None  Discharge Criteria: Patient will be discharged from SLP if patient refuses treatment 3 consecutive times without medical reason, if treatment goals not met, if there is a change in medical status, if patient makes no progress towards goals or if patient is discharged from hospital.  The above assessment, treatment plan, treatment alternatives and goals were discussed and mutually agreed upon: by patient  Fae Pippin, M.A., CCC-SLP [phone]  Jacqueline King 07/06/2011, 10:11 AM

## 2011-07-06 NOTE — Progress Notes (Signed)
Patient ID: Jacqueline King, female   DOB: 13-Jan-1946, 66 y.o.   MRN: [id number]     No new complaints   HPI: Jacqueline King is a 66 y.o. right handed female with history of uncontrolled HTN, admitted on 07/01/11 with dizziness and nausea. Patient with malignant HTN with BP at 200/100. She was noted to have nystagmus, ataxia and nausea on evaluation in ED. 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 2 D echo done revealed mild LVH with EF 55-65%. Carotid dopplers without ICA stenosis. Evaluated by neurology who reducing stroke risk factors with control of lipids and optimum BP control. CTA abdomen done for work up of uncontrolled HTN and revealed vessel irregularity of mid and distal right renal artery with ectasis SMA, small-moderate size pericardial fluid and diffuse wall thickening of rectal and sigmoid colon suspicious for colitis. Followed by [facility] for medical issues- patient has had renal artery angioplasty in the past and focus now would be medical management. Patient evaluated by therapy team and we felt that she would benefit with from CIR.   BP Readings from Last 3 Encounters:  07/06/11 128/81  07/05/11 155/92    Review of Systems  HENT: Negative for hearing loss and neck pain.  Eyes: Negative for blurred vision and double vision.  Respiratory: Negative for cough, shortness of breath and wheezing.  Cardiovascular: Negative for chest pain and palpitations.  Gastrointestinal: Negative for nausea, abdominal pain, diarrhea and blood in stool.  Genitourinary: Negative for dysuria and urgency.  Musculoskeletal: Negative for myalgias.  Neurological: Positive for dizziness (improving) and speech change. Negative for headaches.  Past Medical History   Diagnosis  Date   .  Hypertension    .  H/O radioactive iodine  thyroid ablation     Past Surgical History   Procedure  Date   .  Abdominal hysterectomy    .  Cholecystectomy    .  Angioplasty  1993 or 1994    Family History   Problem  Relation  Age of Onset   .  Diabetes  Mother    .  Diabetes  Sister    .  Diabetes  Brother    Social History: widowed. Works as an Environmental health practitioner at [employer] She reports that she has quit smoking. She does not have any smokeless tobacco history on file. She reports that she does not drink alcohol or use illicit drugs.  Allergies   Allergen  Reactions   .  Sulfa Drugs Cross Reactors  Hives    No prescriptions prior to admission  Was started on synthroid past ablation--didn't follow through.   Home:  Home Living  Lives With: Alone  Available Help at Discharge: (Daughter from [place] here now and to take FMLA)  Type of Home: House  Home Access: Level entry  Home Layout: Two level  Alternate Level Stairs-Number of Steps: 14  Alternate Level Stairs-Rails: Right;Left;Can reach both  Bathroom Shower/Tub: Tub/shower unit;Curtain  Firefighter: Standard  Home Adaptive Equipment: Grab bars in shower  Functional History:  Prior Function  Able to Take Stairs?: Yes  Driving: Yes  Vocation: Electronics engineer)  Comments: Plans to retire sept. 2013  Functional Status:  Mobility:  Bed Mobility  Bed Mobility: Supine to Sit;Sitting - Scoot to Edge of Bed;Sit to Supine  Supine to Sit: 7:  Independent  Sitting - Scoot to Edge of Bed: 7: Independent  Sit to Supine: 7: Independent  Transfers  Transfers: Sit to Stand;Stand to Sit  Sit to Stand: 5: Supervision  Stand to Sit: 5: Supervision  Ambulation/Gait  Ambulation/Gait Assistance: 1: +2 Total assist  Ambulation/Gait: Patient Percentage: 60%  Ambulation Distance (Feet): 90 Feet  Assistive device: 2 person hand held assist  Ambulation/Gait Assistance Details: pt with heavy list to the L. Holds to therapist with Right UE. Gait characterized by scissoring  and stumbling trying to recover balance  Gait Pattern: Step-through pattern;Scissoring  Stairs: No  ADL:  ADL  Upper Body Dressing: Performed;Set up  Where Assessed - Upper Body Dressing: Unsupported sitting  Lower Body Dressing: Performed;Min guard  Where Assessed - Lower Body Dressing: Sopported sit to stand  Toilet Transfer: Simulated;+2 Total assistance  Toilet Transfer Method: (ambulating)  Toilet Transfer Equipment: (bed)  Equipment Used: Gait belt  Transfers/Ambulation Related to ADLs: Pt ambulated ~90 ft with +2 hand held assist due to heavy left side lean. Pt with scissoring and stumbling during ambulation.  ADL Comments: Pt able to perform EOB ADLs at setup level but requires physical assist for all standing components of ADLs due to decreased balance.  Cognition:  Cognition  Overall Cognitive Status: Appears within functional limits for tasks assessed  Arousal/Alertness: Awake/alert  Orientation Level: Oriented X4  Attention: Sustained  Memory: Appears intact  Problem Solving: Appears intact  Executive Function: (tba)  Safety/Judgment: Appears intact  Cognition  Overall Cognitive Status: Appears within functional limits for tasks assessed/performed  Arousal/Alertness: Awake/alert  Orientation Level: Appears intact for tasks assessed  Behavior During Session: [facility] for tasks performed  Blood pressure 162/103, pulse 58, temperature 97.3 F (36.3 C), temperature source Oral, resp. rate 16, height 5\' 1"  (1.549 m), weight 58.1 kg (128 lb 1.4 oz), SpO2 98.00%.  Physical Exam  Nursing note and vitals reviewed.  Constitutional: She is oriented to person, place, and time. She appears well-developed and well-nourished.  HENT: PERRL, EOMI  Head: Normocephalic and atraumatic.  Eyes: Pupils are equal, round, and reactive to light.  Neck: Normal range of motion. Neck supple.  Cardiovascular: Normal rate and regular rhythm. No murmurs rubs or gallops  Pulmonary/Chest: Effort normal  and breath sounds normal. No wheezes rales or rhonchi  Abdominal: Soft. Bowel sounds are normal. Non-distended Musculoskeletal: Normal range of motion. She exhibits no edema and no tenderness.  Neurological: She is alert and oriented to person, place, and time. Coordination abnormal.  Follows commands without difficulty. Good memory, insight, and awareness. Mild ataxia BUE and LE's. Speech mildly dysarthric. dyscongugate gaze worse with gaze to the right. Strength grossly 4/5. No sensory changes are seen.  Skin: Skin is warm and dry.  Psychiatric: She has a normal mood and affect. Her behavior is normal. Judgment and thought content normal.  OCCULT BLOOD X 1 CARD TO LAB, STOOL Status: Normal    Collection Time    07/04/11 11:26 AM   Component  Value  Range  Comment    Fecal Occult Bld  POSITIVE     CBC Status: Abnormal    Collection Time    07/05/11 6:30 AM   Component  Value  Range  Comment    WBC  5.3  4.0 - 10.5 (K/uL)     RBC  3.51 (*)  3.87 - 5.11 (MIL/uL)     Hemoglobin  10.7 (*)  12.0 - 15.0 (g/dL)     HCT  16.1 (*)  36.0 - 46.0 (%)     MCV  83.8  78.0 - 100.0 (fL)     MCH  30.5  26.0 - 34.0 (pg)     MCHC  36.4 (*)  30.0 - 36.0 (g/dL)     RDW  78.2  95.6 - 15.5 (%)     Platelets  152  150 - 400 (K/uL)    BASIC METABOLIC PANEL Status: Abnormal    Collection Time    07/05/11 6:30 AM   Component  Value  Range  Comment    Sodium  138  135 - 145 (mEq/L)     Potassium  3.1 (*)  3.5 - 5.1 (mEq/L)     Chloride  101  96 - 112 (mEq/L)     CO2  26  19 - 32 (mEq/L)     Glucose, Bld  96  70 - 99 (mg/dL)     BUN  11  6 - 23 (mg/dL)     Creatinine, Ser  2.13  0.50 - 1.10 (mg/dL)     Calcium  9.2  8.4 - 10.5 (mg/dL)     GFR calc non Af Amer  67 (*)  >90 (mL/min)     GFR calc Af Amer  77 (*)  >90 (mL/min)    Post Admission Physician Evaluation:  1. Functional deficits secondary to right superior vermis infarct with IVH. Also with small right thalamus and superior peduncle infarcts  ----(hypertensive). 2. Patient is admitted to receive collaborative, interdisciplinary care between the physiatrist, rehab nursing staff, and therapy team. 3. Patient's level of medical complexity and substantial therapy needs in context of that medical necessity cannot be provided at a lesser intensity of care such as a SNF. 4. Patient has experienced substantial functional loss from his/her baseline which was documented above under the "Functional History" and "Functional Status" headings. Judging by the patient's diagnosis, physical exam, and functional history, the patient has potential for functional progress which will result in measurable gains while on inpatient rehab. These gains will be of substantial and practical use upon discharge in facilitating mobility and self-care at the household level. 5. Physiatrist will provide 24 hour management of medical needs as well as oversight of the therapy plan/treatment and provide guidance as appropriate regarding the interaction of the two. 6. 24 hour rehab nursing will assist with bladder management, bowel management, safety, skin/wound care, disease management, medication administration, pain management and patient education and help integrate therapy concepts, techniques,education, etc. 7. PT will assess and treat for: LES, NMR, safety, fxnl mobility, balance, . Goals are: mod I to supervision. 8. OT will assess and treat for: UES, NMR, ADL''s, safety, AET, fxnl mobility. Goals are: mod I to supervision. 9. SLP will assess and treat for: speech intelligibility and communication. Goals are: mod I. 10. Case Management and Social Worker will assess and treat for psychological issues and discharge planning. 11. Team conference will be held weekly to assess progress toward goals and to determine barriers to discharge. 12. Patient will receive at least 3 hours of therapy per day at least 5 days per week. 13. ELOS: 10-14 days Prognosis: good Medical Problem  List and Plan:  1. DVT Prophylaxis/Anticoagulation: Mechanical: Sequential compression devices, below knee Bilateral lower extremities  2. Pain Management: ultram prn for pain.  3. Mood: Monitor for now. LCSW to follow up for formal evalutation.  4. HTN: Continue qid checks for now as BP still labile. Will continue to slowly adjust medication for better control.  On norvasc, chlorthalidone and metoprolol,  5. Dyslipidemia: Continue Lipitor.  6. Hypothyroid: On supplement.  7. Hypokalemia: Likely due to diuretics. Increase K+ supplement and recheck on Monday.  8. Heme positive stool: CT with question of colitis. Patient without any GI symptoms currently. Will monitor with serial CBC and check a couple more specimens for recurrence v/s constipation related.

## 2011-07-06 NOTE — Evaluation (Signed)
Physical Therapy Assessment and Plan  Patient Details  Name: Jacqueline King MRN: [id number] Date of Birth: 03/13/1945  PT Diagnosis: Abnormal posture, Abnormality of gait and Ataxic gait Rehab Potential: Good ELOS: 7-10 days   Today's Date: 07/06/2011 Time: 0900-0950 Time Calculation (min): 50 min  Problem List:  Patient Active Problem List  Diagnoses  . Cerebral hemorrhage  . CVA (cerebral infarction)  . HTN (hypertension)  . Hypothyroidism following radioiodine therapy    Past Medical History:  Past Medical History  Diagnosis Date  . Hypertension   . H/O radioactive iodine thyroid ablation    Past Surgical History:  Past Surgical History  Procedure Date  . Abdominal hysterectomy   . Cholecystectomy   . Renal artery angioplasty 1993 or 1994    Assessment & Plan Clinical Impression: Jacqueline King is a 66 y.o. right handed female with history of uncontrolled HTN, admitted on 07/01/11 with dizziness and nausea. Patient with malignant HTN with BP at 200/100. She was noted to have nystagmus, ataxia and nausea on evaluation in ED. 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 2 D echo done revealed mild LVH with EF 55-65%. Carotid dopplers without ICA stenosis. Evaluated by neurology who reducing stroke risk factors with control of lipids and optimum BP control. CTA abdomen done for work up of uncontrolled HTN and revealed vessel irregularity of mid and distal right renal artery with ectasis SMA, small-moderate size pericardial fluid and diffuse wall thickening of rectal and sigmoid colon suspicious for colitis. Followed by [facility] for medical issues- patient has had renal artery angioplasty in the past and focus now would be medical management.   Patient transferred to CIR on 07/05/2011 .   Patient  currently requires min with mobility secondary to impaired timing and sequencing and decreased coordination.  Prior to hospitalization, patient was Independent with mobility and lived with Alone in a  (2 level condo) home.  Home access is  Level entry.  Patient will benefit from skilled PT intervention to maximize safe functional mobility, minimize fall risk, decrease caregiver burden and allow for independent living for planned discharge home with intermittent assist.  Anticipate patient will benefit from follow up [facility] at discharge.  PT - End of Session Endurance Deficit: Yes Endurance Deficit Description: rest breaks with walking and stairs PT Assessment Rehab Potential: Good Barriers to Discharge: Decreased caregiver support (Daughter is visiting from [place] on FMLA, otherwise lives alone) PT Plan PT Frequency: 1-2 X/day, 60-90 minutes Estimated Length of Stay: 7-10 days PT Treatment/Interventions: Ambulation/gait training;Balance/vestibular training;DME/adaptive equipment instruction;Functional mobility training;Neuromuscular re-education;Patient/family education;Stair training;Therapeutic Activities;Therapeutic Exercise;UE/LE Strength taining/ROM;UE/LE Coordination activities PT Recommendation Follow Up Recommendations: Home health PT  PT Evaluation Precautions/Restrictions Precautions Precautions: Fall Restrictions Weight Bearing Restrictions: No Pain Pain Assessment Pain Assessment: No/denies pain Pain Score: 0-No pain Home Living/Prior Functioning Home Living Lives With: Alone Available Help at Discharge: Family (daughter, mother (will move in with pt)) Type of Home:  (2 level condo) Home Access: Level entry Home Layout: 1/2 bath on main level;Bed/bath upstairs;Two level Alternate Level Stairs-Number of Steps: 13 Alternate Level Stairs-Rails: Right;Left;Can reach both Bathroom Shower/Tub: Tub/shower unit;Curtain (regular shower head) Bathroom Toilet: Standard Bathroom  Accessibility: Yes How Accessible: Accessible via walker Home Adaptive Equipment: Grab bars in shower  Cognition Overall Cognitive Status: Impaired Arousal/Alertness: Awake/alert Orientation Level: Oriented X4 Attention: Selective Selective  Attention: Appears intact Memory: Impaired (unable to recall 3/3 word after 5 mins. ) Memory Impairment: Decreased recall of new information;Storage deficit;Retrieval deficit Awareness: Impaired Awareness Impairment: Emergent impairment Problem Solving: Impaired Problem Solving Impairment: Functional complex Executive Function: Self Monitoring;Self Correcting Self Monitoring: Impaired Self Monitoring Impairment: Functional complex Self Correcting: Impaired Self Correcting Impairment: Functional complex Safety/Judgment: Impaired Comments: Patient unable to state what to do if she needed to go to the bathroom, or recognize that getting unassisted is unsafe at this time Sensation Sensation Light Touch: Appears Intact Coordination Gross Motor Movements are Fluid and Coordinated: Yes Fine Motor Movements are Fluid and Coordinated: Yes Finger Nose Finger Test: WNL Motor    See chart evaluation for details Mobility  See chart evaluation for details Locomotion    See chart evaluation for details Trunk/Postural Assessment  Cervical Assessment Cervical Assessment: Within Functional Limits Thoracic Assessment Thoracic Assessment: Within Functional Limits Lumbar Assessment Lumbar Assessment: Within Functional Limits Postural Control Postural Control: Within Functional Limits  Balance Balance Balance Assessed: Yes Static Sitting Balance Static Sitting - Balance Support: Feet supported Static Sitting - Level of Assistance: 7: Independent Static Standing Balance Static Standing - Balance Support: No upper extremity supported Static Standing - Level of Assistance: 5: Stand by assistance Extremity Assessment      RLE Assessment RLE  Assessment: Within Functional Limits LLE Assessment LLE Assessment: Within Functional Limits  See FIM for current functional status Refer to Care Plan for Long Term Goals  Recommendations for other services: None  Discharge Criteria: Patient will be discharged from PT if patient refuses treatment 3 consecutive times without medical reason, if treatment goals not met, if there is a change in medical status, if patient makes no progress towards goals or if patient is discharged from hospital.  The above assessment, treatment plan, treatment alternatives and goals were discussed and mutually agreed upon: by patient  Rex Kras 07/06/2011, 12:37 PM

## 2011-07-06 NOTE — Progress Notes (Signed)
Physical Therapy Session Note  Patient Details  Name: Jacqueline King MRN: [id number] Date of Birth: Dec 28, 1945  Today's Date: 07/06/2011 Time: 1300-1330 Time Calculation (min): 30 min  Short Term Goals: Week 1:  PT Short Term Goal 1 (Week 1): Patient will be able to perform bed mobility with Independent assist. PT Short Term Goal 2 (Week 1): Patient will be able to perform transfers with Mod-I assist. PT Short Term Goal 3 (Week 1): Patient will be able to perform gait using LRAD with Mod-I assist x 300' in home and community environment. PT Short Term Goal 4 (Week 1): Patient will be able to perform stairs, up/down 14 with Mod-I assist.  Therapy Documentation Precautions:  Precautions Precautions: Fall Restrictions Weight Bearing Restrictions: No Pain: Pain Assessment Pain Assessment: No/denies pain Pain Score: 0-No pain  Therapeutic Activity: (15') Transfer training sit<->stand with S/min-A and requiring verbal and tactile cues for safety                                            Bed Mobility S/Mod-I Therapeutic Exercise: (15') Supine and seated exercises B LE's  Patient very fatigued and desiring to rest/sleep after treatment session.  BTB with bed alarm set. See FIM for current functional status  Therapy/Group: Individual Therapy  Malyn Aytes J 07/06/2011, 1:11 PM

## 2011-07-06 NOTE — Evaluation (Signed)
Occupational Therapy Assessment and Plan  Patient Details  Name: Jacqueline King MRN: [id number] Date of Birth: 04-30-1945  OT Diagnosis: balance problems Rehab Potential:    goood ELOS:   7 days  Today's Date: 07/06/2011 Time: 1100-1200 Time Calculation (min): 60 min  Problem List:  Patient Active Problem List  Diagnoses  . Cerebral hemorrhage  . CVA (cerebral infarction)  . HTN (hypertension)  . Hypothyroidism following radioiodine therapy    Past Medical History:  Past Medical History  Diagnosis Date  . Hypertension   . H/O radioactive iodine thyroid ablation    Past Surgical History:  Past Surgical History  Procedure Date  . Abdominal hysterectomy   . Cholecystectomy   . Renal artery angioplasty 1993 or 1994    Assessment & Plan Clinical Impression:HPI: Jacqueline King is a 65 y.o. right handed female with history of uncontrolled HTN, admitted on 07/01/11 with dizziness and nausea. Patient with malignant HTN with BP at 200/100. She was noted to have nystagmus, ataxia and nausea on evaluation in ED. 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 2 D echo done revealed mild LVH with EF 55-65%. Carotid dopplers without ICA stenosis. Evaluated by neurology who reducing stroke risk factors with control of lipids and optimum BP control. CTA abdomen done for work up of uncontrolled HTN and revealed vessel irregularity of mid and distal right renal artery with ectasis SMA, small-moderate size pericardial fluid and diffuse wall thickening of rectal and sigmoid colon suspicious for colitis. Followed by [facility] for medical issues- patient has had renal artery angioplasty in the past and focus now would be medical management. Patient evaluated by therapy team and we felt that she would benefit with from CIR.   Patient transferred to CIR on 07/05/2011 .    Patient currently requires min with basic self-care skills secondary to muscle weakness.  Prior to hospitalization, patient could complete BADL with no assist.  Patient will benefit from skilled intervention to increase independence with basic self-care skills prior to discharge home with care partner.  Anticipate patient will require intermittent supervision and follow up home health.     OT Evaluation Precautions/Restrictions  Fall risk       VPain  no pain Home Living/Prior Functioning Home Living Lives With: Alone Available Help at Discharge: Family (daughter, mother (will move in with pt)) Type of Home:  (2 level condo) Home Access: Level entry Home Layout: 1/2 bath on main level;Bed/bath upstairs;Two level Alternate Level Stairs-Number of Steps: 13 Alternate Level Stairs-Rails: Right;Left;Can reach both Bathroom Shower/Tub: Tub/shower unit;Curtain (regular shower head) Bathroom Toilet: Standard Bathroom Accessibility: Yes How Accessible: Accessible via walker Home Adaptive Equipment: Grab bars in shower IADL History Homemaking Responsibilities: Yes Meal Prep Responsibility: Primary Laundry Responsibility: Primary Cleaning Responsibility: Primary Bill Paying/Finance Responsibility: Primary Shopping Responsibility: Primary Current License: Yes Mode of Transportation: Car Leisure and Hobbies:  (music, tv) Prior Function Level of Independence: Independent with basic ADLs;Independent with homemaking with ambulation;Independent with gait Able to Take Stairs?: Reciprically Driving: Yes 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  The above assessment, treatment plan, treatment alternatives and goals were discussed and mutually agreed upon: by family  Humberto Seals 07/06/2011, 4:49 PM

## 2011-07-07 MED ORDER — LISINOPRIL 20 MG PO TABS
20.0000 mg | ORAL_TABLET | Freq: Every day | ORAL | Status: DC
  Administered 2011-07-07: 10 mg via ORAL
  Administered 2011-07-08 – 2011-07-10 (×3): 20 mg via ORAL
  Filled 2011-07-07 (×5): qty 1

## 2011-07-07 NOTE — Progress Notes (Signed)
Occupational Therapy Session Note  Patient Details  Name: Jacqueline King MRN: [id number] Date of Birth: 07/22/1945  Today's Date: 07/07/2011 Time:  - 1345-1435 (50 min)   Short Term Goals: Week 1:  OT Short Term Goal 1 (Week 1): 1.  Pt. will be supervision with bathing OT Short Term Goal 2 (Week 1): Pt. will be supervision with dressing.   OT Short Term Goal 3 (Week 1): Pt will be mod I with toileting  Skilled Therapeutic Interventions/Progress Updates:    Engaged in standing balance, functional mobility, fine motor coordination LUE, and working memory.  Utilized kitchen activity to promote balance in standing ( ) and moving from place to place with the RW.  Pt peeled beets using knife and then fingers for coordination.  She reported she was not tired after activity.    Therapy Documentation Precautions:  Precautions Precautions: Fall Restrictions Weight Bearing Restrictions: No       Pain:  none     :    See FIM for current functional status  Therapy/Group: Individual Therapy  Humberto Seals 07/07/2011, 4:09 PM

## 2011-07-07 NOTE — Progress Notes (Signed)
Patient refusing senna x 2 days. On iron , Education done.

## 2011-07-07 NOTE — Progress Notes (Signed)
HPI: Jacqueline King is a 66 y.o. right handed female with history of uncontrolled HTN, admitted on 07/01/11 with dizziness and nausea. Patient with malignant HTN with BP at 200/100. She was noted to have nystagmus, ataxia and nausea on evaluation in ED. 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 2 D echo done revealed mild LVH with EF 55-65%. Carotid dopplers without ICA stenosis. Evaluated by neurology who reducing stroke risk factors with control of lipids and optimum BP control. CTA abdomen done for work up of uncontrolled HTN and revealed vessel irregularity of mid and distal right renal artery with ectasis SMA, small-moderate size pericardial fluid and diffuse wall thickening of rectal and sigmoid colon suspicious for colitis.   She feels well today  BP 155/85  Pulse 60  Temp(Src) 98.7 F (37.1 C) (Oral)  Resp 20  Ht 5.1" (0.13 m)  Wt 129 lb 3 oz (58.6 kg)  BMI 3,492.13 kg/m2  SpO2 98%   Well-developed well-nourished female in no acute distress. HEENT exam atraumatic, normocephalic, extraocular muscles are intact. Neck is supple. No jugular venous distention. Chest clear to auscultation without increased work of breathing. Cardiac exam S1 and S2 are regular. Abdominal exam active bowel sounds, soft, nontender. Extremities no edema.  Post Admission Physician Evaluation:  1. Functional deficits secondary to right superior vermis infarct with IVH. Also with small right thalamus and superior peduncle infarcts ----(hypertensive). 2. Patient is admitted to receive collaborative, interdisciplinary care between the physiatrist, rehab nursing staff, and therapy team. 3. Patient's level of medical complexity and substantial therapy needs in context of that medical necessity cannot be provided at a lesser intensity  of care such as a SNF. 4. Patient has experienced substantial functional loss from his/her baseline which was documented above under the "Functional History" and "Functional Status" headings. Judging by the patient's diagnosis, physical exam, and functional history, the patient has potential for functional progress which will result in measurable gains while on inpatient rehab. These gains will be of substantial and practical use upon discharge in facilitating mobility and self-care at the household level. 5. Physiatrist will provide 24 hour management of medical needs as well as oversight of the therapy plan/treatment and provide guidance as appropriate regarding the interaction of the two. 6. 24 hour rehab nursing will assist with bladder management, bowel management, safety, skin/wound care, disease management, medication administration, pain management and patient education and help integrate therapy concepts, techniques,education, etc. 7. PT will assess and treat for: LES, NMR, safety, fxnl mobility, balance, . Goals are: mod I to supervision. 8. OT will assess and treat for: UES, NMR, ADL''s, safety, AET, fxnl mobility. Goals are: mod I to supervision. 9. SLP will assess and treat for: speech intelligibility and communication. Goals are: mod I. 10. Case Management and Social Worker will assess and treat for psychological issues and discharge planning. 11. Team conference will be held weekly to assess progress toward goals and to determine barriers to discharge. 12. Patient will receive at least 3 hours of therapy per day at least 5 days per week. 13. ELOS: 10-14 days Prognosis: good Medical Problem List and Plan:  1. DVT Prophylaxis/Anticoagulation: Mechanical: Sequential compression devices, below knee Bilateral lower extremities  2. Pain Management: ultram prn for pain.  3. Mood: Monitor for now. LCSW to follow up for formal evalutation.  4.  HTN: Continue qid checks for now as BP still labile.  Will continue to slowly adjust medication for better control. On norvasc, chlorthalidone and metoprolol,  BP Readings from Last 3 Encounters:  07/07/11 155/85  07/05/11 155/92  will increase lisinopril today 5. Dyslipidemia: Continue Lipitor.  6. Hypothyroid: On supplement.  7. Hypokalemia: Likely due to diuretics. Increase K+ supplement and recheck on Monday. k 3.1  8. Heme positive stool: CT with question of colitis. Patient without any GI symptoms currently. Will monitor with serial CBC and check a couple more specimens for recurrence v/s constipation related.

## 2011-07-08 LAB — DIFFERENTIAL
Basophils Relative: 0 % (ref 0–1)
Eosinophils Relative: 2 % (ref 0–5)
Monocytes Absolute: 0.3 10*3/uL (ref 0.1–1.0)
Monocytes Relative: 7 % (ref 3–12)
Neutro Abs: 2.8 10*3/uL (ref 1.7–7.7)

## 2011-07-08 LAB — COMPREHENSIVE METABOLIC PANEL
ALT: 41 U/L — ABNORMAL HIGH (ref 0–35)
AST: 40 U/L — ABNORMAL HIGH (ref 0–37)
Albumin: 3.6 g/dL (ref 3.5–5.2)
Alkaline Phosphatase: 47 U/L (ref 39–117)
CO2: 26 mEq/L (ref 19–32)
Chloride: 98 mEq/L (ref 96–112)
Potassium: 3.5 mEq/L (ref 3.5–5.1)
Sodium: 136 mEq/L (ref 135–145)
Total Bilirubin: 0.3 mg/dL (ref 0.3–1.2)

## 2011-07-08 LAB — CBC
Platelets: 171 10*3/uL (ref 150–400)
RBC: 3.65 MIL/uL — ABNORMAL LOW (ref 3.87–5.11)
RDW: 14.7 % (ref 11.5–15.5)
WBC: 4.8 10*3/uL (ref 4.0–10.5)

## 2011-07-08 NOTE — Progress Notes (Signed)
Social Work Assessment and Plan Social Work Assessment and Plan  Patient Details  Name: Jacqueline King MRN: [id number] Date of Birth: 1945-03-13  Today's Date: 07/08/2011  Problem List:  Patient Active Problem List  Diagnoses  . Cerebral hemorrhage  . CVA (cerebral infarction)  . HTN (hypertension)  . Hypothyroidism following radioiodine therapy   Past Medical History:  Past Medical History  Diagnosis Date  . Hypertension   . H/O radioactive iodine thyroid ablation    Past Surgical History:  Past Surgical History  Procedure Date  . Abdominal hysterectomy   . Cholecystectomy   . Renal artery angioplasty 1993 or 1994   Social History:  reports that she has quit smoking. She does not have any smokeless tobacco history on file. She reports that she does not drink alcohol or use illicit drugs.  Family / Support Systems Marital Status: Divorced Patient Roles: Parent;Other (Comment) (Employee) Children: Carollee Herter Russell-daughter [phone]  Marina Goodell Wyatt-brother [phone]-home  [phone] Other Supports: Mom local Anticipated Caregiver: Daughter and rest of the family-siblings and Mom, along with three other children Ability/Limitations of Caregiver: Supervision/min level Caregiver Availability: 24/7 Family Dynamics: Close knit family who help one another.  Pt is very independent and stubborn and wnats to do it on her own.  Daughter assisting her with care and coordinating other fmaily members for her care at discharge.  Social History Preferred language: English Religion: Baptist Cultural Background: No issues Education: Some college courses Read: Yes Write: Yes Employment Status: Employed Return to Work Plans: Was planning to retire in Sept 2013 Legal Hisotry/Current Legal Issues: No issues Guardian/Conservator: None   Abuse/Neglect Physical Abuse: Denies Verbal Abuse: Denies Sexual Abuse: Denies Exploitation of patient/patient's resources: Denies Self-Neglect:  Denies Possible abuse reported to:: Other (Comment) (N/A)  Emotional Status Pt's affect, behavior adn adjustment status: Pt is able to explain her stroke and deficits.  She is encouraged by her progress thus far and will do her best to recover and remain independent.  She prides herself on this. Recent Psychosocial Issues: Other health issues Pyschiatric History: No history- Depression Screen score-2.  She reports;" I will do well and will be able to care for myself."  Daughter reports: " She does what she wants to do." Substance Abuse History: No issues  Patient / Family Perceptions, Expectations & Goals Pt/Family understanding of illness & functional limitations: Pt and daughter have a good understanding of her storke.  Pt's daughter is an Charity fundraiser and has a good understanding, but struggles with trying to get her mother to do what she needs to do to take care of herself. Premorbid pt/family roles/activities: Mother, Employee, Sibling, Daughter, Mining engineer, etc Anticipated changes in roles/activities/participation: Plans to resume Pt/family expectations/goals: Pt states: " I will be able to take care of myself, I always have, what would stop me now."  Daughter reports; " We will do whatever is needed to hepl her, she is our [employer]."  [employer]: None Premorbid Home Care/DME Agencies: None Transportation available at discharge: [employer] referrals recommended: Support group (specify) (CVA Support Group)  Discharge Planning Living Arrangements: Alone Support Systems: Children;Parent;Other relatives;Friends/neighbors;Church/faith community Type of Residence: Private residence Insurance Resources: Administrator (specify) Chief Executive Officer) Financial Resources: Employment Financial Screen Referred: No Living Expenses: Own Money Management: Patient Do you have any problems obtaining your medications?: No Home Management: Pt Patient/Family Preliminary  Plans: Return home with family assisting with her care.  Pt is high level and will probably reach supervision level.  Daughter is making  arrangements for  a family member to be there with her at discharge. Social Work Anticipated Follow Up Needs: HH/OP;Support Group DC Planning Additional Notes/Comments: Pt needs a PCP to follow up with her medical issues at discharge  Clinical Impression Pleasant female who is extremely independent and set in her ways.  Very supportive family who are willing to assist if pt will allow them too.  Daughter here from [place] to assist. Work on PCP and making sure pt has follow up at discharge.  Lucy Chris 07/08/2011, 1:43 PM

## 2011-07-08 NOTE — Progress Notes (Signed)
Occupational Therapy Session Note  Patient Details  Name: Jacqueline King MRN: [id number] Date of Birth: 17-Aug-1945  Today's Date: 07/08/2011 Time: 1100-1200 Time Calculation (min): 60 min  Short Term Goals: Week 1:  OT Short Term Goal 1 (Week 1): 1.  Pt. will be supervision with bathing OT Short Term Goal 2 (Week 1): Pt. will be supervision with dressing.   OT Short Term Goal 3 (Week 1): Pt will be mod I with toileting  Skilled Therapeutic Interventions/Progress Updates: ADL-retraining with emphasis on patient/family ed (mother and daughter present), dynamic sitting/standing balance, problem-solving, safety awareness, functional mobility with RW, and sustained attention.  Patient able to complete BADL with set-up and supervision but demo's deficits with error recognition, short-term memory, and problem-solving particularly when encountering obstacles during functional mobility.   Patient demo'd LOB X1, posterior, while dressing, standing, and compensates for instability with a widened stance.   Patient required repeated reminders to use arms and armrests to stand versus attempting to pull up with RW.     Therapy Documentation  Precautions:  Precautions Precautions: Fall Restrictions Weight Bearing Restrictions: No  Vital Signs: Therapy Vitals Temp: 98.1 F (36.7 C) Temp src: Oral Pulse Rate: 62  Resp: 20  BP: 146/80 mmHg Patient Position, if appropriate: Lying Oxygen Therapy SpO2: 97 % O2 Device: None (Room air)  Pain: Pain Assessment Pain Assessment: No/denies pain  See FIM for current functional status  Therapy/Group: Individual Therapy  Georgeanne Nim 07/08/2011, 12:11 PM

## 2011-07-08 NOTE — Progress Notes (Signed)
Physical Therapy Session Note  Patient Details  Name: Jacqueline King MRN: [id number] Date of Birth: September 27, 1945  Today's Date: 07/08/2011 Time:  - 9:30-10:30    Short Term Goals: Week 1:  PT Short Term Goal 1 (Week 1): Patient will be able to perform bed mobility with Independent assist. PT Short Term Goal 2 (Week 1): Patient will be able to perform transfers with Mod-I assist. PT Short Term Goal 3 (Week 1): Patient will be able to perform gait using LRAD with Mod-I assist x 300' in home and community environment. PT Short Term Goal 4 (Week 1): Patient will be able to perform stairs, up/down 14 with Mod-I assist.  Skilled Therapeutic Interventions/Progress Updates:  Family was present and they were educated on balance deficits and need for supervision at home.     Therapy Documentation Precautions:  Precautions Precautions: Fall Restrictions Weight Bearing Restrictions: No General:   Vital Signs: Therapy Vitals Temp: 98.1 F (36.7 C) Temp src: Oral Pulse Rate: 62  Resp: 20  BP: 146/80 mmHg Patient Position, if appropriate: Lying Oxygen Therapy SpO2: 97 % O2 Device: None (Room air) Pain: Pain Assessment Pain Assessment: No/denies pain Mobility:   Locomotion : Ambulation Ambulation: Yes Ambulation/Gait Assistance: 4: Min assist Ambulation Distance (Feet): 150 Feet Assistive device: Rolling walker Gait Gait: Yes High Level Ambulation High Level Ambulation: Sudden stops;Head turns Sudden Stops: with rw and increased sway Head Turns: used rw . pt with increased sway. Stairs / Additional Locomotion Stairs: Yes Stairs Assistance: 4: Min guard Stair Management Technique: Two rails Number of Stairs: 8   Trunk/Postural Assessment : Postural Control Postural Control:  (pt did not demonstrate any balance reaction with posterior L)  Balance: Balance Balance Assessed: Yes Standardized Balance Assessment Standardized Balance Assessment: Berg Balance Test Berg Balance  Test Sit to Stand: Able to stand without using hands and stabilize independently Standing Unsupported: Unable to stand 30 seconds unassisted Sitting with Back Unsupported but Feet Supported on Floor or Stool: Able to sit safely and securely 2 minutes Stand to Sit: Sits safely with minimal use of hands Transfers: Needs one person to assist Standing Unsupported with Eyes Closed: Able to stand 10 seconds with supervision Standing Ubsupported with Feet Together: Able to place feet together independently and stand for 1 minute with supervision From Standing, Reach Forward with Outstretched Arm: Reaches forward but needs supervision From Standing Position, Pick up Object from Floor: Able to pick up shoe, needs supervision From Standing Position, Turn to Look Behind Over each Shoulder: Needs assist to keep from losing balance and falling Turn 360 Degrees: Needs assistance while turning Standing Unsupported, Alternately Place Feet on Step/Stool: Able to complete >2 steps/needs minimal assist Standing Unsupported, One Foot in Front: Loses balance while stepping or standing Standing on One Leg: Unable to try or needs assist to prevent fall Total Score: 24  Static Standing Balance Static Standing - Balance Support: No upper extremity supported Static Standing - Level of Assistance: 1: +1 Total assist (posterior LOB after 2 minutes, no balance reaction.) Exercises:   Other Treatments: Treatments Therapeutic Activity: wedge training with rotation and reaching with vc and tactile cues not to allow either knee to lock backwards. pt with improved abilitty to perform tandum stance after activity. min assist.  See FIM for current functional status  Therapy/Group: Individual Therapy  Julian Reil 07/08/2011, 10:36 AM

## 2011-07-08 NOTE — Progress Notes (Signed)
Speech Language Pathology Daily Session Note  Patient Details  Name: Jacqueline King MRN: [id number] Date of Birth: 1945/03/11  Today's Date: 07/08/2011 Time: 1300-1400 Time Calculation (min): 60 min  Short Term Goals: Week 1:  SLP Short Term Goal 1 (Week 1): Patient will utilize compensatory strategies at the conversational level of verbal expression with subtle cues to maintain 98% intelligibility SLP Short Term Goal 2 (Week 1): Patient will utilize memory compensatory aids to recall daily information with modified independence SLP Short Term Goal 3 (Week 1): Patient will demonstrate high level problem solving during functional tasks with suble cues  Skilled Therapeutic Interventions: Treatment focused on establishing patient centered goals for both functional communication and cognition with her daughter and mother present to assist.  Patient denies any cognitive changes, however her daughter reports memory changes a few months prior to this CVA marked by repeating the same stories 5-6 times in the same week with the daughter giving her mom direct feedback that she had already shared that information. 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  Provided patient with oral-motor education materials for her to carry out independently.   FIM:  Comprehension Comprehension Mode: Auditory Comprehension: 5-Follows basic conversation/direction: With extra time/assistive device Expression Expression Mode: Verbal Expression: 5-Expresses basic needs/ideas: With extra time/assistive device Social Interaction Social Interaction: 5-Interacts appropriately 90% of the time - Needs monitoring or encouragement for participation or interaction. Problem Solving Problem  Solving: 4-Solves basic 75 - 89% of the time/requires cueing 10 - 24% of the time Memory Memory: 4-Recognizes or recalls 75 - 89% of the time/requires cueing 10 - 24% of the time FIM - Eating Eating Activity: 7: Complete independence:no helper  Pain Pain Assessment Pain Assessment: No/denies pain  Therapy/Group: Individual Therapy  Myra Rude, M.S.,CCC-SLP Pager [phone] 07/08/2011, 2:33 PM

## 2011-07-08 NOTE — Progress Notes (Signed)
Physical Therapy Note  Patient Details  Name: Jacqueline King MRN: [id number] Date of Birth: [birth year] Today's Date: 07/08/2011  14:00-14:15 individual therapy pt denied pain.  Session started late due to pt in speech therapy. Performed side steps with rw and walking backwards with minguard assist and initial vc for walker placement. Standing anterior weight shifts to aid ankle strategies x 10. Gait controlled environment minguard assist.   Julian Reil 07/08/2011, 4:11 PM

## 2011-07-08 NOTE — Care Management (Signed)
[facility] Individual Statement of Services  Patient Name:  Jacqueline King  Date:  07/08/2011  Welcome to the [facility].  Our goal is to provide you with an individualized program based on your diagnosis and situation, designed to meet your specific needs.  With this comprehensive rehabilitation program, you will be expected to participate in at least 3 hours of rehabilitation therapies Monday-Friday, with modified therapy programming on the weekends.  Your rehabilitation program will include the following services:  Physical Therapy (PT), Occupational Therapy (OT), Speech Therapy (ST), 24 hour per day rehabilitation nursing, Therapeutic Recreaction (TR), Case Management (RN and Child psychotherapist), Rehabilitation Medicine, Nutrition Services and Pharmacy Services  Weekly team conferences will be held on  Wednesday  to discuss your progress.  Your RN Case Designer, television/film set will talk with you frequently to get your input and to update you on team discussions.  Team conferences with you and your family in attendance may also be held.  Expected length of stay: 7-10 days    Overall anticipated outcome: Modified Independent-Supervision  Depending on your progress and recovery, your program may change.  Your RN Case Estate agent will coordinate services and will keep you informed of any changes.  Your RN Sports coach and SW names and contact numbers are listed  below.  The following services may also be recommended but are not provided by the [facility]:   Driving Evaluations  Home Health Rehabiltiation Services  Outpatient Rehabilitatation [facility]  Vocational Rehabilitation   Arrangements will be made to provide these services after discharge if needed.  Arrangements include referral to agencies that provide these services.  Your insurance has been verified to be:  [employer] + Medicare A & B Your primary doctor is:     Pertinent information will be shared with your doctor and your insurance company.  Case Manager: Melanee Spry, [facility] [phone]  Social Worker:  Dossie Der, [place] [phone]  Information discussed with and copy given to patient by: Brock Ra, 07/08/2011, 2:54 PM

## 2011-07-08 NOTE — Progress Notes (Signed)
Subjective/Complaints: No new complaints. No complaints of abdominal pain, No BM recorded by nsg over weekend or N/V. Sleeping well. Review of Systems  Gastrointestinal: Positive for constipation.  All other systems reviewed and are negative.   Objective: Vital Signs: Blood pressure 146/80, pulse 62, temperature 98.1 F (36.7 C), temperature source Oral, resp. rate 20, height 5.1" (0.13 m), weight 58.6 kg (129 lb 3 oz), SpO2 97.00%.  Last 5 Results:  Lab 07/08/11 0720 07/05/11 0630 07/04/11 0630 07/03/11 0913 07/02/11 0211  WBC 4.8 5.3 6.2 5.9 6.7  HGB 11.0* 10.7* 10.8* 11.0* 10.1*  HCT 30.6* 29.4* 29.6* 30.5* 27.6*  PLT 171 152 145* 146* 139*  NA 136 138 138 139 136  K 3.5 3.1* 3.3* 3.6 4.3  CL 98 101 104 109 100  CO2 26 26 23 21 23   GLUCOSE 94 96 98 142* 130*  BUN 19 11 10 15 14   CREATININE 0.89 0.89 0.87 0.89 0.81  CALCIUM 9.6 9.2 8.7 8.2* 8.1*    Physical Exam:  General appearance: alert, cooperative and no distress Resp: clear to auscultation bilaterally Cardio: regular rate and rhythm, S1, S2 normal, no murmur, click, rub or gallop GI: soft, non-tender; bowel sounds normal; no masses,  no organomegaly Extremities: extremities normal, atraumatic, no cyanosis or edema Skin: Skin color, texture, turgor normal. No rashes or lesions Neurologic: Grossly normal. Speech with dysarthria.  Ataxia with LUE finger to nose.   Assessment/Plan: 1. Functional deficits secondary to R superior cerebellar vermis infarct which require 3+ hours per day of interdisciplinary therapy in a comprehensive inpatient rehab setting. Physiatrist is providing close team supervision and 24 hour management of active medical problems listed below. Physiatrist and rehab team continue to assess barriers to discharge/monitor patient progress toward functional and medical goals. FIM:       FIM - Toileting Toileting steps completed by patient: Adjust clothing prior to toileting;Performs perineal  hygiene;Adjust clothing after toileting Toileting Assistive Devices: Grab bar or rail for support Toileting: 4: Steadying assist  FIM - Archivist Transfers: 5-To toilet/BSC: Supervision (verbal cues/safety issues);5-From toilet/BSC: Supervision (verbal cues/safety issues)  FIM - Banker Devices: Therapist, occupational: 7: Supine > Sit: No assist;7: Sit > Supine: No assist;5: Bed > Chair or W/C: Supervision (verbal cues/safety issues);4: Chair or W/C > Bed: Min A (steadying Pt. > 75%)  FIM - Locomotion: Ambulation Ambulation/Gait Assistance: 4: Min assist  Comprehension Comprehension Mode: Auditory Comprehension: 6-Follows complex conversation/direction: With extra time/assistive device  Expression Expression Mode: Verbal Expression: 6-Expresses complex ideas: With extra time/assistive device  Social Interaction Social Interaction: 6-Interacts appropriately with others with medication or extra time (anti-anxiety, antidepressant).  Problem Solving Problem Solving: 6-Solves complex problems: With extra time  Memory Memory: 6-More than reasonable amt of time   Medical Problem List and Plan:  1. DVT Prophylaxis/Anticoagulation: Mechanical: Sequential compression devices, below knee Bilateral lower extremities  2. Pain Management: ultram prn for pain.  3. Mood: Monitor for now. LCSW to follow up for formal evalutation.  4. HTN: Continue qid checks for now as BP still labile. Will continue to slowly adjust medication for better control. On norvasc, chlorthalidone and metoprolol with improving control.  Monitor for hypotension as meds get to steady state.   5. Dyslipidemia: Continue Lipitor.  6. Hypothyroid: On supplement.  7. Hypokalemia: Likely due to diuretics. Increased K+ supplement  Effective but low normal.  Will monitor. 8. Heme positive stool: CT with question of colitis. Patient without any GI symptoms  currently.   Stool guaiacs pending.   3  Jacqueline King 07/08/2011, 8:49 AM

## 2011-07-09 DIAGNOSIS — Z5189 Encounter for other specified aftercare: Secondary | ICD-10-CM

## 2011-07-09 DIAGNOSIS — I69993 Ataxia following unspecified cerebrovascular disease: Secondary | ICD-10-CM

## 2011-07-09 MED ORDER — CALCIUM POLYCARBOPHIL 625 MG PO TABS
1250.0000 mg | ORAL_TABLET | Freq: Every day | ORAL | Status: DC
  Administered 2011-07-09 – 2011-07-16 (×8): 1250 mg via ORAL
  Filled 2011-07-09 (×10): qty 2

## 2011-07-09 NOTE — Progress Notes (Signed)
Patient had a small, soft, formed bowel movement.  Patient denies diarrhea or urgency.  Patient states the senokot was making her stool too soft.  Notified Pam Love, PA of patient's bowel movement.  New order received.  Will continue to monitor.

## 2011-07-09 NOTE — Progress Notes (Signed)
Speech Language Pathology Daily Session Note  Patient Details  Name: Jacqueline King MRN: [id number] Date of Birth: [birth year]  Today's Date: 07/09/2011 Time: 1422-1500 Time Calculation (min): 38 min  Short Term Goals: Week 1: SLP Short Term Goal 1 (Week 1): Patient will utilize compensatory strategies at the conversational level of verbal expression with subtle cues to maintain 98% intelligibility SLP Short Term Goal 2 (Week 1): Patient will utilize memory compensatory aids to recall daily information with modified independence SLP Short Term Goal 3 (Week 1): Patient will demonstrate high level problem solving during functional tasks with suble cues  Skilled Therapeutic Interventions: Treatment session focused on utilizing handout for oral motor exercises and self monitoring and correcting verbal expression.  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 Patient took phone call during session and demonstrated self monitoring and correcting durign call x1 with modified independence.     FIM:  Comprehension Comprehension Mode: Auditory Comprehension: 5-Follows basic conversation/direction: With no assist Expression Expression Mode: Verbal Expression: 5-Expresses complex 90% of the time/cues < 10% of the time Social Interaction Social Interaction: 6-Interacts appropriately with others with medication or extra time (anti-anxiety, antidepressant). Problem Solving Problem Solving: 5-Solves basic 90% of the time/requires cueing < 10% of the time Memory Memory: 4-Recognizes or recalls 75 - 89% of the time/requires cueing 10 - 24% of the time FIM - Eating Eating Activity: 7: Complete independence:no helper  Pain Pain Assessment Pain  Assessment: No/denies pain Pain Score: 0-No pain  Therapy/Group: Individual Therapy  Charlane Ferretti., CCC-SLP [phone]  [name] 07/09/2011, 3:16 PM

## 2011-07-09 NOTE — Progress Notes (Signed)
Subjective/Complaints: No new complaints.  positive BM today. Sleeping well. Amb with PT Review of Systems  No chest pain, SOB or abdominal pain. All other systems reviewed and are negative.   Objective: Vital Signs: Blood pressure 141/85, pulse 64, temperature 98 F (36.7 C), temperature source Oral, resp. rate 17, height 5.1" (0.13 m), weight 58.6 kg (129 lb 3 oz), SpO2 96.00%.  Last 5 Results:  Lab 07/08/11 0720 07/05/11 0630 07/04/11 0630 07/03/11 0913  WBC 4.8 5.3 6.2 5.9  HGB 11.0* 10.7* 10.8* 11.0*  HCT 30.6* 29.4* 29.6* 30.5*  PLT 171 152 145* 146*  NA 136 138 138 139  K 3.5 3.1* 3.3* 3.6  CL 98 101 104 109  CO2 26 26 23 21   GLUCOSE 94 96 98 142*  BUN 19 11 10 15   CREATININE 0.89 0.89 0.87 0.89  CALCIUM 9.6 9.2 8.7 8.2*    Physical Exam:  General appearance: alert, cooperative and no distress Resp: clear to auscultation bilaterally Cardio: regular rate and rhythm, S1, S2 normal, no murmur, click, rub or gallop GI: soft, non-tender; bowel sounds normal; no masses,  no organomegaly Extremities: extremities normal, atraumatic, no cyanosis or edema Skin: Skin color, texture, turgor normal. No rashes or lesions Neurologic: Grossly normal. Speech with dysarthria.  Ataxia with LUE finger to nose.  Cannot maintain single leg stance with R leg up holding on to walker Assessment/Plan: 1. Functional deficits secondary to R superior cerebellar vermis infarct which require 3+ hours per day of interdisciplinary therapy in a comprehensive inpatient rehab setting. Physiatrist is providing close team supervision and 24 hour management of active medical problems listed below. Physiatrist and rehab team continue to assess barriers to discharge/monitor patient progress toward functional and medical goals. FIM: FIM - Bathing Bathing Steps Patient Completed: Chest;Right Arm;Left Arm;Abdomen;Front perineal area;Buttocks;Right upper leg;Left upper leg;Right lower leg (including  foot);Left lower leg (including foot) Bathing: 5: Set-up assist to: Obtain items  FIM - Upper Body Dressing/Undressing Upper body dressing/undressing steps patient completed: Thread/unthread right bra strap;Thread/unthread left bra strap;Hook/unhook bra;Thread/unthread right sleeve of pullover shirt/dresss;Thread/unthread left sleeve of pullover shirt/dress;Put head through opening of pull over shirt/dress;Pull shirt over trunk Upper body dressing/undressing: 5: Set-up assist to: Obtain clothing/put away FIM - Lower Body Dressing/Undressing Lower body dressing/undressing steps patient completed: Thread/unthread right underwear leg;Thread/unthread left underwear leg;Thread/unthread right pants leg;Pull underwear up/down;Thread/unthread left pants leg;Pull pants up/down;Fasten/unfasten pants;Don/Doff right sock;Don/Doff left sock;Don/Doff right shoe;Don/Doff left shoe;Fasten/unfasten right shoe;Fasten/unfasten left shoe Lower body dressing/undressing: 5: Set-up assist to: Obtain clothing  FIM - Toileting Toileting steps completed by patient: Adjust clothing prior to toileting;Performs perineal hygiene;Adjust clothing after toileting Toileting Assistive Devices: Grab bar or rail for support Toileting: 4: Steadying assist  FIM - Diplomatic Services operational officer Devices: Bedside commode Toilet Transfers: 5-To toilet/BSC: Supervision (verbal cues/safety issues);5-From toilet/BSC: Supervision (verbal cues/safety issues)  FIM - Banker Devices: Therapist, occupational: 4: Bed > Chair or W/C: Min A (steadying Pt. > 75%);4: Chair or W/C > Bed: Min A (steadying Pt. > 75%)  FIM - Locomotion: Wheelchair Locomotion: Wheelchair: 0: Activity did not occur FIM - Locomotion: Ambulation Locomotion: Ambulation Assistive Devices: Designer, industrial/product Ambulation/Gait Assistance: 4: Min assist Locomotion: Ambulation: 4: Travels 150 ft or more with minimal  assistance (Pt.>75%)  Comprehension Comprehension Mode: Auditory Comprehension: 5-Follows basic conversation/direction: With extra time/assistive device  Expression Expression Mode: Verbal Expression: 5-Expresses basic needs/ideas: With no assist  Social Interaction Social Interaction: 6-Interacts appropriately with others with medication or  extra time (anti-anxiety, antidepressant).  Problem Solving Problem Solving: 5-Solves basic 90% of the time/requires cueing < 10% of the time  Memory Memory: 4-Recognizes or recalls 75 - 89% of the time/requires cueing 10 - 24% of the time   Medical Problem List and Plan:  1. DVT Prophylaxis/Anticoagulation: Mechanical: Sequential compression devices, below knee Bilateral lower extremities  2. Pain Management: ultram prn for pain.  3. Mood: Monitor for now. LCSW to follow up for formal evalutation.  4. HTN: Continue qid checks for now --as SBP now in 140s range. Will continue to slowly adjust medication for better control. On norvasc, chlorthalidone, lisinopril and metoprolol with improving control.  Monitor for hypotension as meds get to steady state.   5. Dyslipidemia: Continue Lipitor.  6. Hypothyroid: On supplement.  7. Hypokalemia: Likely due to diuretics. Will recheck in am. 8. Heme positive stool: CT with question of colitis.  Stool guaiacs pending. Reporting loose stools (has been refusing laxatives)--will check C-diff. Add fiber con for bulking.  4  Jacqueline King 07/09/2011, 9:18 AM

## 2011-07-09 NOTE — Progress Notes (Signed)
Occupational Therapy Session Note  Patient Details  Name: Jacqueline King MRN: [id number] Date of Birth: 15-Dec-1945  Today's Date: 07/09/2011 Time: 8657-8469 Time Calculation (min): 53 min  Short Term Goals: Week 1:  OT Short Term Goal 1 (Week 1): 1.  Pt. will be supervision with bathing OT Short Term Goal 2 (Week 1): Pt. will be supervision with dressing.   OT Short Term Goal 3 (Week 1): Pt will be mod I with toileting  Skilled Therapeutic Interventions: ADL-retraining at walk-in shower with emphasis on dynamic sitting/standing balance, safety use of AE (RW), safety awareness, problem-solving, and fine motor control of bil UE during grooming/hygeine.   Patient generally denies deficits with bil UE coordination although dropping items in shower occasionally during bathing and over-shooting target while reaching for controls or supplies.   Patient is able to complete bathing and dressing tasks but currently relies on assistance to organize materials and progress through sequence.   Dynamic sitting balance is [facility] however during sit-stand patient demo's occasional LOB, posterior, and compensates with wide-stance or leans into bench.  Speech is slowed and slurred during communication although patient remains on task and topic during conversation.     Therapy Documentation Precautions:  Precautions Precautions: Fall Restrictions Weight Bearing Restrictions: No  Vital Signs: Therapy Vitals Pulse Rate: 64  BP: 141/85 mmHg  Pain: Pain Assessment Pain Assessment: No/denies pain  See FIM for current functional status  Therapy/Group: Individual Therapy  Georgeanne Nim 07/09/2011, 12:05 PM

## 2011-07-09 NOTE — Progress Notes (Addendum)
Physical Therapy Session Note  Patient Details  Name: Jacqueline King MRN: [id number] Date of Birth: [birth year]  Today's Date: 07/09/2011 Time: 1422-1500 Time Calculation (min): 38 min  Short Term Goals: Week 1:  PT Short Term Goal 1 (Week 1): Patient will be able to perform bed mobility with Independent assist. PT Short Term Goal 1 - Progress (Week 1): Progressing toward goal PT Short Term Goal 2 (Week 1): Patient will be able to perform transfers with Mod-I assist. PT Short Term Goal 3 (Week 1): Patient will be able to perform gait using LRAD with Mod-I assist x 300' in home and community environment. PT Short Term Goal 3 - Progress (Week 1): Progressing toward goal PT Short Term Goal 4 (Week 1): Patient will be able to perform stairs, up/down 14 with Mod-I assist.  Skilled Therapeutic Interventions/Progress Updates:    Balance and gait training without UE support requiring physical assist to prevent falls. Pt demonstrates decreased awareness by speeding up when balance worsens with fatigue and also telling OT she did not need a device when she went home. PT educated pt she will likely need a gait device for safety at d/c for fall prevention.  Pt also performed gait while bouncing, catching and throwing ball up to self.  Discussed deficits seen with ST.  Therapy Documentation Precautions:  Precautions Precautions: Fall Restrictions Weight Bearing Restrictions: No   Pain: Pain Assessment Pain Assessment: No/denies pain Pain Score: 0-No pain Mobility:   Locomotion : Ambulation Ambulation/Gait Assistance: 4: Min assist Ambulation Distance (Feet): 225 Feet Assistive device: None Ambulation/Gait Assistance Details: frequesnt lateral LOB to R and L, inattention to R perispace x 1 (hit door frame on way out of room) High Level Ambulation High Level Ambulation: Backwards walking;Direction changes;Sudden stops;Head turns;Other high level ambulation (changes in speed) Stairs / Additional  Locomotion Stairs: Yes Stairs Assistance: 4: Min guard Stair Management Technique: One rail Right;Alternating pattern;Step to pattern Number of Stairs: 10     See FIM for current functional status  Therapy/Group: Individual Therapy  Michaelene Song 07/09/2011, 3:22 PM  D

## 2011-07-09 NOTE — Progress Notes (Signed)
Physical Therapy Session Note  Patient Details  Name: Jacqueline King MRN: [id number] Date of Birth: 1945/07/13  Today's Date: 07/09/2011 Time: 1478-2956 Time Calculation (min): 60 min  Short Term Goals: Week 1:  PT Short Term Goal 1 (Week 1): Patient will be able to perform bed mobility with Independent assist. PT Short Term Goal 2 (Week 1): Patient will be able to perform transfers with Mod-I assist. PT Short Term Goal 3 (Week 1): Patient will be able to perform gait using LRAD with Mod-I assist x 300' in home and community environment. PT Short Term Goal 3 - Progress (Week 1): Discontinued (comment) (revised goal to household in LTG) PT Short Term Goal 4 (Week 1): Patient will be able to perform stairs, up/down 14 with Mod-I assist.  Skilled Therapeutic Interventions/Progress Updates:    NMR- Biodex with LOS and weight shifting to promote balance reactions, required some facilitation initially, especially with anterior posterior shifts.  Progressed to standing challenges with perturbations, LOB posteriorly but improved with multiple trials.  Discussed fall risk related to decreased balance strategies and compensation with gait device.  Gait training without device to promote balance with significant  increased fall risk with fatigue and cognitive task (veering to R and L vs without fatigue only LOB to R) min A to correct LOB. 150 x 3, 150 with RW close S controlled setting with noted intattention to R perispace x 1.  Pt was unable to state which side of her body most affected by stroke, even after being able to do SLS on L but not R with RW, awareness training of deficits.  Therapy Documentation Precautions:  Precautions Precautions: Fall Restrictions Weight Bearing Restrictions: No General:   Vital Signs: Therapy Vitals Pulse Rate: 64  BP: 141/85 mmHg Pain: Pain Assessment Pain Assessment: No/denies pain Mobility:  mod I sit to stand with UE use, close S stand pivot transfer  with RW Locomotion : Ambulation Ambulation/Gait Assistance: 4: Min assist Ambulation Distance (Feet): 150 Feet Assistive device: None Ambulation/Gait Assistance Details: physical assist to correct LOB to R, increased veering R with fatigue and with turns Gait Gait velocity: 10 m walk test 16.4 sec High Level Ambulation High Level Ambulation: Other high level ambulation (cognitive tasks)  Other Treatments: Treatments Neuromuscular Facilitation: Left;Activity to increase motor control;Activity to increase timing and sequencing;Activity to increase lateral weight shifting;Activity to increase anterior-posterior weight shifting (on Biodex)  See FIM for current functional status  Therapy/Group: Individual Therapy  Michaelene Song 07/09/2011, 10:14 AM

## 2011-07-10 LAB — BASIC METABOLIC PANEL
BUN: 17 mg/dL (ref 6–23)
Creatinine, Ser: 0.88 mg/dL (ref 0.50–1.10)
GFR calc non Af Amer: 67 mL/min — ABNORMAL LOW (ref >90)
Glucose, Bld: 99 mg/dL (ref 70–99)
Potassium: 3.5 mEq/L (ref 3.5–5.1)

## 2011-07-10 MED ORDER — LISINOPRIL 20 MG PO TABS
20.0000 mg | ORAL_TABLET | Freq: Two times a day (BID) | ORAL | Status: DC
  Administered 2011-07-10 – 2011-07-15 (×12): 20 mg via ORAL
  Filled 2011-07-10 (×17): qty 1

## 2011-07-10 NOTE — Progress Notes (Signed)
Subjective/Complaints: No new complaints.  positive BM today. Sleeping well. Amb with PT Review of Systems  No chest pain, SOB or abdominal pain. All other systems reviewed and are negative.   Objective: Vital Signs: Blood pressure 134/84, pulse 59, temperature 97.4 F (36.3 C), temperature source Oral, resp. rate 20, height 5.1" (0.13 m), weight 58.6 kg (129 lb 3 oz), SpO2 96.00%.  Last 5 Results:  Lab 07/10/11 0645 07/08/11 0720 07/05/11 0630 07/04/11 0630 07/03/11 0913  WBC -- 4.8 5.3 6.2 5.9  HGB -- 11.0* 10.7* 10.8* 11.0*  HCT -- 30.6* 29.4* 29.6* 30.5*  PLT -- 171 152 145* 146*  NA 136 136 138 138 139  K 3.5 3.5 3.1* 3.3* 3.6  CL 97 98 101 104 109  CO2 27 26 26 23 21   GLUCOSE 99 94 96 98 142*  BUN 17 19 11 10 15   CREATININE 0.88 0.89 0.89 0.87 0.89  CALCIUM 9.4 9.6 9.2 8.7 8.2*    Physical Exam:  General appearance: alert, cooperative and no distress Resp: clear to auscultation bilaterally Cardio: regular rate and rhythm, S1, S2 normal, no murmur, click, rub or gallop GI: soft, non-tender; bowel sounds normal; no masses,  no organomegaly Extremities: extremities normal, atraumatic, no cyanosis or edema Skin: Skin color, texture, turgor normal. No rashes or lesions Neurologic: Grossly normal. Speech with dysarthria.  Ataxia with LUE finger to nose.  Cannot maintain single leg stance with R leg up holding on to walker Ataxia L Finger nose finger Assessment/Plan: 1. Functional deficits secondary to R superior cerebellar vermis infarct which require 3+ hours per day of interdisciplinary therapy in a comprehensive inpatient rehab setting. Physiatrist is providing close team supervision and 24 hour management of active medical problems listed below. Physiatrist and rehab team continue to assess barriers to discharge/monitor patient progress toward functional and medical goals. FIM: FIM - Bathing Bathing Steps Patient Completed: Chest;Right Arm;Left Arm;Abdomen;Front  perineal area;Right upper leg;Buttocks;Left upper leg;Right lower leg (including foot);Left lower leg (including foot) Bathing: 5: Supervision: Safety issues/verbal cues  FIM - Upper Body Dressing/Undressing Upper body dressing/undressing steps patient completed: Thread/unthread right bra strap;Thread/unthread left bra strap;Hook/unhook bra;Thread/unthread right sleeve of pullover shirt/dresss;Thread/unthread left sleeve of pullover shirt/dress;Put head through opening of pull over shirt/dress;Pull shirt over trunk Upper body dressing/undressing: 5: Set-up assist to: Obtain clothing/put away FIM - Lower Body Dressing/Undressing Lower body dressing/undressing steps patient completed: Thread/unthread right underwear leg;Thread/unthread left underwear leg;Pull underwear up/down;Thread/unthread right pants leg;Thread/unthread left pants leg;Pull pants up/down;Fasten/unfasten pants;Don/Doff right sock;Don/Doff left sock;Don/Doff right shoe;Don/Doff left shoe;Fasten/unfasten right shoe;Fasten/unfasten left shoe Lower body dressing/undressing: 5: Supervision: Safety issues/verbal cues  FIM - Toileting Toileting steps completed by patient: Adjust clothing prior to toileting;Performs perineal hygiene;Adjust clothing after toileting Toileting Assistive Devices: Grab bar or rail for support Toileting: 4: Steadying assist  FIM - Diplomatic Services operational officer Devices: Grab bars;Walker Toilet Transfers: 4-To toilet/BSC: Min A (steadying Pt. > 75%);4-From toilet/BSC: Min A (steadying Pt. > 75%)  FIM - Bed/Chair Transfer Bed/Chair Transfer Assistive Devices: Therapist, occupational: 4: Bed > Chair or W/C: Min A (steadying Pt. > 75%);4: Chair or W/C > Bed: Min A (steadying Pt. > 75%)  FIM - Locomotion: Wheelchair Locomotion: Wheelchair: 0: Activity did not occur FIM - Locomotion: Ambulation Locomotion: Ambulation Assistive Devices: Designer, industrial/product Ambulation/Gait Assistance: 4: Min  assist Locomotion: Ambulation: 4: Travels 150 ft or more with minimal assistance (Pt.>75%)  Comprehension Comprehension Mode: Auditory Comprehension: 5-Follows basic conversation/direction: With extra time/assistive device  Expression Expression  Mode: Verbal Expression: 5-Expresses basic needs/ideas: With extra time/assistive device  Social Interaction Social Interaction: 6-Interacts appropriately with others with medication or extra time (anti-anxiety, antidepressant).  Problem Solving Problem Solving: 5-Solves basic 90% of the time/requires cueing < 10% of the time  Memory Memory: 4-Recognizes or recalls 75 - 89% of the time/requires cueing 10 - 24% of the time   Medical Problem List and Plan:  1. DVT Prophylaxis/Anticoagulation: Mechanical: Sequential compression devices, below knee Bilateral lower extremities  2. Pain Management: ultram prn for pain.  3. Mood: motivated without signs of distress. LCSW to follow up for formal evalutation.  4. HTN: Continue qid checks for now --as SBP 169-130  range. Will continue to slowly adjust medication for better control. On norvasc, chlorthalidone and metoprolol. Will increase lisinopril to bid.  Monitor for hypotension as meds get to steady state.   5. Dyslipidemia: Continue Lipitor.  6. Hypothyroid: On supplement.  7. Hypokalemia: Likely due to diuretics. Low normal and stable recheck this am.  8. Heme positive stool:  Stool guaiacs of 6/11 negative X 2. hgb stable.  5  Jacqueline King 07/10/2011, 8:55 AM

## 2011-07-10 NOTE — Progress Notes (Signed)
Patient ID: Jacqueline King, female   DOB: [birth year], 66 y.o.   MRN: [id number] Subjective/Complaints: No new complaints.  positive BM today. Sleeping well. Amb with PT Review of Systems  No chest pain, SOB or abdominal pain. All other systems reviewed and are negative.   Objective: Vital Signs: Blood pressure 134/84, pulse 59, temperature 97.4 F (36.3 C), temperature source Oral, resp. rate 20, height 5.1" (0.13 m), weight 58.6 kg (129 lb 3 oz), SpO2 96.00%.  Last 5 Results:  Lab 07/10/11 0645 07/08/11 0720 07/05/11 0630 07/04/11 0630  WBC -- 4.8 5.3 6.2  HGB -- 11.0* 10.7* 10.8*  HCT -- 30.6* 29.4* 29.6*  PLT -- 171 152 145*  NA 136 136 138 138  K 3.5 3.5 3.1* 3.3*  CL 97 98 101 104  CO2 27 26 26 23   GLUCOSE 99 94 96 98  BUN 17 19 11 10   CREATININE 0.88 0.89 0.89 0.87  CALCIUM 9.4 9.6 9.2 8.7    Physical Exam:  General appearance: alert, cooperative and no distress Resp: clear to auscultation bilaterally Cardio: regular rate and rhythm, S1, S2 normal, no murmur, click, rub or gallop GI: soft, non-tender; bowel sounds normal; no masses,  no organomegaly Extremities: extremities normal, atraumatic, no cyanosis or edema Skin: Skin color, texture, turgor normal. No rashes or lesions Neurologic: Grossly normal. Speech with dysarthria.  Ataxia with LUE finger to nose.  Cannot maintain single leg stance with R leg up holding on to walker Assessment/Plan: 1. Functional deficits secondary to R superior cerebellar vermis infarct which require 3+ hours per day of interdisciplinary therapy in a comprehensive inpatient rehab setting. Physiatrist is providing close team supervision and 24 hour management of active medical problems listed below. Physiatrist and rehab team continue to assess barriers to discharge/monitor patient progress toward functional and medical goals. FIM: FIM - Bathing Bathing Steps Patient Completed: Chest;Right Arm;Left Arm;Abdomen;Front perineal area;Right  upper leg;Buttocks;Left upper leg;Right lower leg (including foot);Left lower leg (including foot) Bathing: 5: Supervision: Safety issues/verbal cues  FIM - Upper Body Dressing/Undressing Upper body dressing/undressing steps patient completed: Thread/unthread right bra strap;Thread/unthread left bra strap;Hook/unhook bra;Thread/unthread right sleeve of pullover shirt/dresss;Thread/unthread left sleeve of pullover shirt/dress;Put head through opening of pull over shirt/dress;Pull shirt over trunk Upper body dressing/undressing: 5: Set-up assist to: Obtain clothing/put away FIM - Lower Body Dressing/Undressing Lower body dressing/undressing steps patient completed: Thread/unthread right underwear leg;Thread/unthread left underwear leg;Pull underwear up/down;Thread/unthread right pants leg;Thread/unthread left pants leg;Pull pants up/down;Fasten/unfasten pants;Don/Doff right sock;Don/Doff left sock;Don/Doff right shoe;Don/Doff left shoe;Fasten/unfasten right shoe;Fasten/unfasten left shoe Lower body dressing/undressing: 5: Supervision: Safety issues/verbal cues  FIM - Toileting Toileting steps completed by patient: Adjust clothing prior to toileting;Performs perineal hygiene;Adjust clothing after toileting Toileting Assistive Devices: Grab bar or rail for support Toileting: 4: Steadying assist  FIM - Diplomatic Services operational officer Devices: Grab bars;Walker Toilet Transfers: 4-To toilet/BSC: Min A (steadying Pt. > 75%);4-From toilet/BSC: Min A (steadying Pt. > 75%)  FIM - Bed/Chair Transfer Bed/Chair Transfer Assistive Devices: Therapist, occupational: 4: Bed > Chair or W/C: Min A (steadying Pt. > 75%);4: Chair or W/C > Bed: Min A (steadying Pt. > 75%)  FIM - Locomotion: Wheelchair Locomotion: Wheelchair: 0: Activity did not occur FIM - Locomotion: Ambulation Locomotion: Ambulation Assistive Devices: Designer, industrial/product Ambulation/Gait Assistance: 4: Min assist Locomotion:  Ambulation: 4: Travels 150 ft or more with minimal assistance (Pt.>75%)  Comprehension Comprehension Mode: Auditory Comprehension: 5-Follows basic conversation/direction: With extra time/assistive device  Expression Expression Mode: Verbal Expression: 5-Expresses  basic needs/ideas: With extra time/assistive device  Social Interaction Social Interaction: 6-Interacts appropriately with others with medication or extra time (anti-anxiety, antidepressant).  Problem Solving Problem Solving: 5-Solves basic 90% of the time/requires cueing < 10% of the time  Memory Memory: 4-Recognizes or recalls 75 - 89% of the time/requires cueing 10 - 24% of the time   Medical Problem List and Plan:  1. DVT Prophylaxis/Anticoagulation: Mechanical: Sequential compression devices, below knee Bilateral lower extremities  2. Pain Management: ultram prn for pain.  3. Mood: Monitor for now. LCSW to follow up for formal evalutation.  4. HTN: Continue qid checks for now --as SBP now in 140s range. Will continue to slowly adjust medication for better control. On norvasc, chlorthalidone, lisinopril and metoprolol with improving control.  Monitor for hypotension as meds get to steady state.   5. Dyslipidemia: Continue Lipitor.  6. Hypothyroid: On supplement.  7. Hypokalemia: Likely due to diuretics. Will recheck in am. 8. Heme positive stool: hemoglobin stable outpt f/u GI  5  Chibuike Fleek E 07/10/2011, 10:39 AM

## 2011-07-10 NOTE — Progress Notes (Signed)
Occupational Therapy Session Note  Patient Details  Name: Jacqueline King MRN: [id number] Date of Birth: April 21, 1945  Today's Date: 07/10/2011 Time: 4098-1191 Time Calculation (min): 53 min  Short Term Goals: Week 1:  OT Short Term Goal 1 (Week 1): 1.  Pt. will be supervision with bathing OT Short Term Goal 2 (Week 1): Pt. will be supervision with dressing.   OT Short Term Goal 3 (Week 1): Pt will be mod I with toileting  Skilled Therapeutic Interventions: ADL-retraining with emphasis on dynamic standing balance, safe and effective use of assistive devices for functional mobility (RW), safety awareness, error recognition, problem-solving, and short-term memory.   Patient completed bathing and dressing seated on tub bench in walk-in shower with supervision for safety.   No LOB noted during sit-stand for pericare this session however prior to collecting her clothing patient was challenged with choice of ambulating with or without device; patient selected without device (RW) but immediately demo'd LOB after stepping away from recliner toward dresser.  OT re-educated patient on hazards with falls and patient acknowledged balance deficit as still requiring use of AE.  Patient continues to demo overconfidence with dynamic balance when encountering obstacles as evidenced by attempting side-steps and/or stepping over or behind lead leg to maneuver past obstacle.   Per dw P.T., evidence of possible cognitive deficits warrants discharge recommendation to home with supervision at this time.   Plan to continue challenges to safety awareness and problem-solving during treatment.     Therapy Documentation Precautions:  Precautions Precautions: Fall Restrictions Weight Bearing Restrictions: No  Pain: Pain Assessment Pain Assessment: 0-10 Pain Score: 0-No pain  See FIM for current functional status  Therapy/Group: Individual Therapy  Georgeanne Nim 07/10/2011, 12:14 PM

## 2011-07-10 NOTE — Progress Notes (Signed)
Physical Therapy Note  Patient Details  Name: Jacqueline King MRN: [id number] Date of Birth: March 21, 1945 Today's Date: 07/10/2011  8:30-9:30 individual therapy pt denies pain.  Gait with rw 150' min assist due to rt wheel coming off floor with turning. Pt with tendency to stiffen arms into extension and to walk very fast with inability to adjust speed or respond to LOB. Performed side step squats to rt. To increase hip abductor strength, stepping forwards and backwards over board with vc and tactile cues  For squatting and UE placement to teach hip strategy. Pt stepping slow but slight improvements made with balance reactions. Min assist. Performed balance reactions on balance beam focusing on squatting and returning to standing. Pt total assist for posterior LOB and able to correct with vc to bend trunk forward and not to lock knees when returning to standing. Pt's tendency is to stiffen trunk and UEs with LOB. Gait with rw after session slightly improved with vc to relax arms, pt with LOB to rt. With turning, practiced head turns increase sway both ways. 150'.      Julian Reil 07/10/2011, 9:13 AM

## 2011-07-10 NOTE — Progress Notes (Signed)
Occupational Therapy Session Note  Patient Details  Name: Jacqueline King MRN: [id number] Date of Birth: Oct 10, 1945  Today's Date: 07/10/2011 Time: 4098-1191 Time Calculation (min): 45 min  Short Term Goals: Week 1:  OT Short Term Goal 1 (Week 1): 1.  Pt. will be supervision with bathing OT Short Term Goal 2 (Week 1): Pt. will be supervision with dressing.   OT Short Term Goal 3 (Week 1): Pt will be mod I with toileting  Skilled Therapeutic Interventions: Therapeutic activity with emphasis on dynamic standing balance, functional mobility using RW, safety awareness, and performance of IADL (simulated working at computer use and laundry).   Patient ambulates with RW and requires close supervision when added distractions are present.  OT notes instability during functional mob when patient is distracted and rotates at her trunk to turn to see something.   Patient also becomes disorientated to location and is not able to reorient without assist (verbal cues).    Patient retrieved items from dryer in patient laundry and was escorted back to her room to replace clothing in her dresser.   She was not concerned with folding cloths or sorting and merely placed everything into top drawer until advised to order items.   Patient was then escorted to family waiting room to demo her computer skills.   Patient selected [email or web address] as her work home page and completed log in using her user ID and password successfully but only after moderate assist to problem-solve with navigation and entering data at correct location.  Patient was consistently unable to detect that she had mistyped characters repeatedly during attempted log in.   Once work account was established, patient could navigate to her last entry and was able to explain process of generating purchase orders and reconciling bills, fluently.     Therapy Documentation Precautions:  Precautions Precautions: Fall Restrictions Weight Bearing Restrictions:  No  Pain: Pain Assessment Pain Assessment: No/denies pain Pain Score: 0-No pain  See FIM for current functional status  Therapy/Group: Individual Therapy  Georgeanne Nim 07/10/2011, 1:56 PM

## 2011-07-10 NOTE — Progress Notes (Signed)
Social Work Patient ID: Jacqueline King, female   DOB: [birth year], 66 y.o.   MRN: [id number] Met with pt and spoke with daughter via telephone to inform of team conference goals-supervision level and discharge 6/18. Daughter plans to come back prior to pt's discharge to provide 24 hour supervision level and transportation to OP therapies. Pt is agreeable to goals and feels she is improving but it is slow.  She states: " My balance is awful, this is hard."  Pt is used to being Very independent and wants to be again.  Continue to work on discharge agreeable to OP therapies, made referral at [facility]. Both pleased with discharge and goals.

## 2011-07-10 NOTE — Progress Notes (Signed)
Speech Language Pathology Daily Session Note  Patient Details  Name: Jacqueline King MRN: [id number] Date of Birth: 11/13/1945  Today's Date: 07/10/2011 Time: 1420-1510 Time Calculation (min): 50 min  Short Term Goals: Week 1: SLP Short Term Goal 1 (Week 1): Patient will utilize compensatory strategies at the conversational level of verbal expression with subtle cues to maintain 98% intelligibility SLP Short Term Goal 2 (Week 1): Patient will utilize memory compensatory aids to recall daily information with modified independence SLP Short Term Goal 3 (Week 1): Patient will demonstrate high level problem solving during functional tasks with suble cues  Skilled Therapeutic Interventions: Treatment session focused on skilled treatment of cognition; per chart review SLP discovered discrepencies in awarenenss and problem solving with basic tasks and through diagnostic treatment found that patient required max assist to complete basic daily functional math problems no max assist to recognize difficulty and no requests for help; howvever, after increase wait time SLP had to assist patient to complete task.  Following activity some intellectual awareness of cognivie deficis was present.     FIM:  Social Interaction Social Interaction: 6-Interacts appropriately with others with medication or extra time (anti-anxiety, antidepressant). FIM - Eating Eating Activity: 7: Complete independence:no helper  Pain Pain Assessment Pain Assessment: No/denies pain Pain Score: 0-No pain  Therapy/Group: Individual Therapy  Charlane Ferretti., CCC-SLP [phone]  Jacqueline King 07/10/2011, 4:02 PM

## 2011-07-11 NOTE — Progress Notes (Signed)
Patient ID: Adrien Dietzman, female   DOB: 1945-10-05, 66 y.o.   MRN: [id number] Subjective/Complaints: No new complaints.  Excited about D/C date, daughter, mother will assist Review of Systems  No chest pain, SOB or abdominal pain. All other systems reviewed and are negative.   Objective: Vital Signs: Blood pressure 131/84, pulse 60, temperature 99 F (37.2 C), temperature source Oral, resp. rate 19, height 5.1" (0.13 m), weight 58.333 kg (128 lb 9.6 oz), SpO2 96.00%.  Last 5 Results:  Lab 07/10/11 0645 07/08/11 0720 07/05/11 0630  WBC -- 4.8 5.3  HGB -- 11.0* 10.7*  HCT -- 30.6* 29.4*  PLT -- 171 152  NA 136 136 138  K 3.5 3.5 3.1*  CL 97 98 101  CO2 27 26 26   GLUCOSE 99 94 96  BUN 17 19 11   CREATININE 0.88 0.89 0.89  CALCIUM 9.4 9.6 9.2    Physical Exam:  General appearance: alert, cooperative and no distress Resp: clear to auscultation bilaterally Cardio: regular rate and rhythm, S1, S2 normal, no murmur, click, rub or gallop GI: soft, non-tender; bowel sounds normal; no masses,  no organomegaly Extremities: extremities normal, atraumatic, no cyanosis or edema Skin: Skin color, texture, turgor normal. No rashes or lesions Neurologic: Grossly normal. Speech with dysarthria.  Ataxia with LUE finger to nose.  Cannot maintain single leg stance with R leg up holding on to walker Assessment/Plan: 1. Functional deficits secondary to R superior cerebellar vermis infarct which require 3+ hours per day of interdisciplinary therapy in a comprehensive inpatient rehab setting. Physiatrist is providing close team supervision and 24 hour management of active medical problems listed below. Physiatrist and rehab team continue to assess barriers to discharge/monitor patient progress toward functional and medical goals. FIM: FIM - Bathing Bathing Steps Patient Completed: Chest;Right Arm;Left Arm;Abdomen;Front perineal area;Buttocks;Right upper leg;Left upper leg;Right lower leg (including  foot);Left lower leg (including foot) Bathing: 5: Supervision: Safety issues/verbal cues  FIM - Upper Body Dressing/Undressing Upper body dressing/undressing steps patient completed: Thread/unthread right bra strap;Thread/unthread left bra strap;Hook/unhook bra;Thread/unthread right sleeve of pullover shirt/dresss;Thread/unthread left sleeve of pullover shirt/dress;Put head through opening of pull over shirt/dress;Pull shirt over trunk;Thread/unthread right sleeve of front closure shirt/dress;Thread/unthread left sleeve of front closure shirt/dress;Pull shirt around back of front closure shirt/dress;Button/unbutton shirt Upper body dressing/undressing: 5: Supervision: Safety issues/verbal cues FIM - Lower Body Dressing/Undressing Lower body dressing/undressing steps patient completed: Thread/unthread right underwear leg;Thread/unthread left underwear leg;Pull underwear up/down;Thread/unthread right pants leg;Thread/unthread left pants leg;Pull pants up/down;Don/Doff right sock;Don/Doff left sock;Don/Doff right shoe;Don/Doff left shoe;Fasten/unfasten right shoe;Fasten/unfasten left shoe Lower body dressing/undressing: 5: Supervision: Safety issues/verbal cues  FIM - Toileting Toileting steps completed by patient: Adjust clothing prior to toileting;Performs perineal hygiene;Adjust clothing after toileting Toileting Assistive Devices: Grab bar or rail for support Toileting: 4: Steadying assist  FIM - Diplomatic Services operational officer Devices: Grab bars;Walker Toilet Transfers: 4-To toilet/BSC: Min A (steadying Pt. > 75%);4-From toilet/BSC: Min A (steadying Pt. > 75%)  FIM - Bed/Chair Transfer Bed/Chair Transfer Assistive Devices: Therapist, occupational: 0: Activity did not occur  FIM - Locomotion: Wheelchair Locomotion: Wheelchair: 0: Activity did not occur FIM - Locomotion: Ambulation Locomotion: Ambulation Assistive Devices: Designer, industrial/product Ambulation/Gait Assistance: 4: Min  assist Locomotion: Ambulation: 4: Travels 150 ft or more with minimal assistance (Pt.>75%)  Comprehension Comprehension Mode: Auditory Comprehension: 5-Follows basic conversation/direction: With extra time/assistive device  Expression Expression Mode: Verbal Expression: 5-Expresses basic needs/ideas: With extra time/assistive device  Social Interaction Social Interaction: 6-Interacts appropriately with others  with medication or extra time (anti-anxiety, antidepressant).  Problem Solving Problem Solving: 4-Solves basic 75 - 89% of the time/requires cueing 10 - 24% of the time  Memory Memory: 4-Recognizes or recalls 75 - 89% of the time/requires cueing 10 - 24% of the time   Medical Problem List and Plan:  1. DVT Prophylaxis/Anticoagulation: Mechanical: Sequential compression devices, below knee Bilateral lower extremities  2. Pain Management: ultram prn for pain.  3. Mood: Monitor for now. LCSW to follow up for formal evalutation.  4. HTN: Continue qid checks for now --as SBP now in 140s range. Will continue to slowly adjust medication for better control. On norvasc, chlorthalidone, lisinopril and metoprolol with improving control.  Monitor for hypotension as meds get to steady state.   5. Dyslipidemia: Continue Lipitor.  6. Hypothyroid: On supplement.  7. Hypokalemia: Likely due to diuretics. Will recheck in am. 8. Heme positive stool: hemoglobin stable outpt f/u GI  6  Ranada Vigorito E 07/11/2011, 8:33 AM

## 2011-07-11 NOTE — Progress Notes (Signed)
Occupational Therapy Session Note  Patient Details  Name: Jacqueline King MRN: [id number] Date of Birth: 1946/01/21  Today's Date: 07/11/2011 Time: 1100-1200 Time Calculation (min): 60 min  Short Term Goals: Week 1:  OT Short Term Goal 1 (Week 1): 1.  Pt. will be supervision with bathing OT Short Term Goal 2 (Week 1): Pt. will be supervision with dressing.   OT Short Term Goal 3 (Week 1): Pt will be mod I with toileting  Skilled Therapeutic Interventions/Progress Updates:    Pt engaged in ADL retraining with focus on safety awareness and standing balance.  Pt amb with RW to gather clothing and supplies before walking to bathroom to complete bathing tasks in shower.  Pt utilized grab bars when standing and demonstrated LOB X 2 but able to self correct.  Pt required min verbal cues for RW safety when ambulating.  Pt completed all dressing tasks with sit to stand from w/c.  Pt transitioned to ADL apartment to practice stepping into bathtub.  Pt completed task with min A.  Pt stated she didn't need anything to sit on when she took a shower.  When reminded that she needed to sit earlier when showering, pt did not reply.  Recommended that pt have a bath chair to sit on when discharging home.  Pt exhibited occasional LOB when ambulating with RW but able to self correct.  Pt required min verbal cues for BUE positioning prior to standing and sitting.  Therapy Documentation Precautions:  Precautions Precautions: Fall Restrictions Weight Bearing Restrictions: No   Pain: Pain Assessment Pain Assessment: No/denies pain Pain Score: 0-No pain Patients Stated Pain Goal: 3 Multiple Pain Sites: No  See FIM for current functional status  Therapy/Group: Individual Therapy  Rich Brave 07/11/2011, 12:03 PM

## 2011-07-11 NOTE — Patient Care Conference (Signed)
Inpatient RehabilitationTeam Conference Note Date: 07/10/2011   Time: 10:46 AM    Patient Name: Jacqueline King      Medical Record Number: [id number]  Date of Birth: 20-Aug-1945 Sex: Female         Room/Bed: 4038/4038-01 Payor Info: Payor: BLUE CROSS BLUE SHIELD  Plan: [facility] HEALTH PPO  Product Type: *No Product type*     Admitting Diagnosis: R CVA  Admit Date/Time:  07/05/2011  3:34 PM Admission Comments: No comment available   Primary Diagnosis:  CVA (cerebral infarction) Principal Problem: CVA (cerebral infarction)  Patient Active Problem List   Diagnosis Date Noted  . Cerebral hemorrhage 07/05/2011  . CVA (cerebral infarction) 07/05/2011  . HTN (hypertension) 07/05/2011  . Hypothyroidism following radioiodine therapy 07/05/2011    Expected Discharge Date: Expected Discharge Date: 07/16/11  Team Members Present: Physician: Dr. Claudette Laws Case Manager Present: Melanee Spry, RN Social Worker Present: Dossie Der, LCSW Nurse Present: Rosalio Macadamia, RN PT Present: Edman Circle, Judith Blonder, PTA;Edson Snowball, PT OT Present: Leonette Monarch, OT SLP Present: Other (comment) Elio Forget)     Current Status/Progress Goal Weekly Team Focus  Medical   problems with balance and coordination particularly on the left side.  improved safety in ambulation  train caregivers, coordinate with out-of-town family   Bowel/Bladder   Continent of bowel and bladder, LBM 07/09/11-soft/formd.  Patient complaining of soft stool (due to senokot, which was d/c'd 07/09/11)   Continent of bowel and bladder      Swallow/Nutrition/ Hydration             ADL's     S-Min A   Mod I basic ADLs   balance, safety awareness  Mobility   26/56 Berg, decreased balance reactions and weight shifting, min A for gait with frequent LOB  mod I ambulatory  balance reactions and emergent awareness   Communication   supervision  mod I  patient educatoin and carryover   Safety/Cognition/ Behavioral  Observations  supervision   supervision   family education   Pain   No complaints of pain.  </=2      Skin   Intact  No new breakdown         *See Interdisciplinary Assessment and Plan and progress notes for long and short-term goals  Barriers to Discharge: no local children provide post discharge care    Possible Resolutions to Barriers:  coordinate with out of town family provides needed 24 7 supervision    Discharge Planning/Teaching Needs:  Home daughter to stay with short time then other family members to assist with care.  Working on obtaining a primary MD      Team Discussion: Pt participating--limited by poor balance & decreased awareness of deficits.   Revisions to Treatment Plan: May downgrade goals to Supervision--TBD    Continued Need for Acute Rehabilitation Level of Care: The patient requires daily medical management by a physician with specialized training in physical medicine and rehabilitation for the following conditions: Daily direction of a multidisciplinary physical rehabilitation program to ensure safe treatment while eliciting the highest outcome that is of practical value to the patient.: Yes Daily medical management of patient stability for increased activity during participation in an intensive rehabilitation regime.: Yes Daily analysis of laboratory values and/or radiology reports with any subsequent need for medication adjustment of medical intervention for : Neurological problems  Brock Ra 07/11/2011, 11:46 AM

## 2011-07-11 NOTE — Progress Notes (Signed)
Physical Therapy Session Note  Patient Details  Name: Jacqueline King MRN: [id number] Date of Birth: 1945/07/07  Today's Date: 07/11/2011 Time: 1191-4782 and 9562-1308 Time Calculation (min): 29 min and 29 min  Short Term Goals: Week 1:  PT Short Term Goal 1 (Week 1): Patient will be able to perform bed mobility with Independent assist. PT Short Term Goal 1 - Progress (Week 1): Progressing toward goal PT Short Term Goal 2 (Week 1): Patient will be able to perform transfers with Mod-I assist. PT Short Term Goal 3 (Week 1): Patient will be able to perform gait using LRAD with Mod-I assist x 300' in home and community environment. PT Short Term Goal 3 - Progress (Week 1): Progressing toward goal PT Short Term Goal 4 (Week 1): Patient will be able to perform stairs, up/down 14 with Mod-I assist.     Skilled Therapeutic Interventions/Progress Updates:  Focus on: neuro re-ed, mobility, gait training, therapeutic ex, standing balance  AM- Gait training x 150' x , 215' x 1 with HHA or RW, tactile cues for mild LOB to R, VCs for upright posture.  Gait stepping over low objects on floor, with HHA, mild LOB recovered independently.  Decreased accuracy missing obstacles with R foot noted.  Neuromuscular re-education trunk and pelvis for trunk lengthening and shortening to facilitate balance reactions; difficulty with L trunk lengthening noted.  Standing balance activity on compliant surface in tandem step patterns, wt shifting and mini-squats 2 x 10, with frequent LOB posteriorly and to R noted; min assist to recover.  Therapeutic exercise performed with bil LEs to increase strength for functional mobility, seated on Kinetron, x 10 minutes, at resistance 25 cm/second.  PM-  Simulated car transfer to sedan height seat, with supervision.  Therapeutic exercise performed with bil LEs to increase strength and balance reactions for functional mobility: 10 x 1 each side stepping L and R.  To R, she had  frequent LOB backwards, regained slowly but independently 50% of time. Also,  calf raises, toes up.  Eccentric control during stand> sit, with isometric contraction to hold in squat position, x 5.  Advanced gait training backwards with HHA to facilitate balance strategies.  Pt demonstrates slow ankle strategy, and limited hip strategy, using stepping strategy first.         Therapy Documentation Precautions:  Precautions Precautions: Fall Restrictions Weight Bearing Restrictions: No   Pain:      Locomotion :        See FIM for current functional status  Therapy/Group: Individual Therapy  Tomi Paddock 07/11/2011, 3:58 PM

## 2011-07-11 NOTE — Care Management Note (Signed)
Patient ID: Jacqueline King, female   DOB: 1945-05-04, 66 y.o.   MRN: [id number] Update faxed to Carolan Clines at [facility].

## 2011-07-11 NOTE — Progress Notes (Signed)
Speech Language Pathology Daily Session Note  Patient Details  Name: Jacqueline King MRN: [id number] Date of Birth: [birth year]  Today's Date: 07/11/2011 Time: 4098-1191 Time Calculation (min): 45 min  Short Term Goals: Week 1: SLP Short Term Goal 1 (Week 1): Patient will utilize compensatory strategies at the conversational level of verbal expression with subtle cues to maintain 98% intelligibility SLP Short Term Goal 2 (Week 1): Patient will utilize memory compensatory aids to recall daily information with modified independence SLP Short Term Goal 3 (Week 1): Patient will demonstrate high level problem solving during functional tasks with suble cues  Skilled Therapeutic Interventions: Treatment session focused on skilled treatment of cognition; SLP facilitated session with moderate assist semantic cues to complete basic daily, functional math problems with some emergent awareness during and after the activity; howvever, required mod assist to recall deficits 10 minutes after activity.  SLP provided a written reminder to assist with recall.     FIM:  Comprehension Comprehension Mode: Auditory Comprehension: 5-Follows basic conversation/direction: With extra time/assistive device Expression Expression Mode: Verbal Expression: 5-Expresses complex 90% of the time/cues < 10% of the time Social Interaction Social Interaction: 6-Interacts appropriately with others with medication or extra time (anti-anxiety, antidepressant). Problem Solving Problem Solving: 4-Solves basic 75 - 89% of the time/requires cueing 10 - 24% of the time Memory Memory: 4-Recognizes or recalls 75 - 89% of the time/requires cueing 10 - 24% of the time  Pain Pain Assessment Pain Assessment: No/denies pain Pain Score: 0-No pain  Therapy/Group: Individual Therapy  Charlane Ferretti., CCC-SLP [phone]  [name] 07/11/2011, 11:23 AM

## 2011-07-12 NOTE — Progress Notes (Signed)
Physical Therapy Note  Patient Details  Name: Bama Hanselman MRN: [id number] Date of Birth: 02-Jun-1945 Today's Date: 07/12/2011  14:00-14:30 individual therapy pt denied pain   Gait with rw minguard assist with increased sway to rt. Performed marching with rt leg to increase stance time on left with some improvement. Performed quadruped hip extension both LEs with difficulty clearing left leg x 10 each, high kneel for cognitive rehab and hip extension x 10 minutes with 1 LOB. Pt required initial max cues for naming food while catching ball. Pt required phenomic and semantic cues and cues for sequencing task. Progressed activity to standing with 2 LOB posteriorly. Pt started to demonstrate balance reaction with tactile and vc not to allow knees to lock into hyper- extension. Cues needed for naming food also decreased to mod assist, performed gait back to room without rw tossing and catching ball and picking it up off floor while naming min assist. After session pt stated she did terrible and was surprised by her difficulty naming food. Pt was educated on the relationship between balancing and cognition.  Julian Reil 07/12/2011, 3:17 PM

## 2011-07-12 NOTE — Progress Notes (Addendum)
Physical Therapy Weekly Progress Note  Patient Details  Name: Jacqueline King MRN: [id number] Date of Birth: 1945-03-04  Today's Date: 07/12/2011 Time: 0800-0900; 60 minutes     Patient has met 1 of 4 short term goals.  Pt has progressed well physically, but exhibits safety issues regarding use of RW, route finding, anticipatory awareness.  LTGs have been downgraded to supervision overall.  Patient continues to demonstrate the following deficits: weakness, balance, motor control, safety and therefore will continue to benefit from skilled PT intervention to enhance overall performance with balance, postural control, ability to compensate for deficits, functional use of  right upper extremity and right lower extremity, attention, awareness and coordination.  Patient not progressing toward long term goals.  See goal revision..  Plan of care revisions: .  PT Short Term Goals Week 1:  PT Short Term Goal 1 (Week 1): Patient will be able to perform bed mobility with Independent assist. PT Short Term Goal 1 - Progress (Week 1): Met PT Short Term Goal 2 (Week 1): Patient will be able to perform transfers with Mod-I assist. PT Short Term Goal 2 - Progress (Week 1): Partly met PT Short Term Goal 3 (Week 1): Patient will be able to perform gait using LRAD with Mod-I assist x 300' in home and community environment. PT Short Term Goal 3 - Progress (Week 1): Not met PT Short Term Goal 4 (Week 1): Patient will be able to perform stairs, up/down 14 with Mod-I assist. PT Short Term Goal 4 - Progress (Week 1): Partly met  Week 2:same as LTGs, which have been revised due to pt's cognitive deficits for new learning, impacting safety.     Skilled Therapeutic Interventions/Progress Updates: focus on balance, neuro re-ed, strengthening, mobility  Sit>< stand required VCs each time for safe hand placement on seat rather than RW.  Gait training x 150' using RW on tile and carpet, with supervision to min assist.   Advanced gait training , without RW, with min guarding, weaving in/out of chair, pushing chairs up to table,  3 LOB to R, requiring min assist to regain 1/3; regained independently 2/3.   Therapeutic exercise performed with bil LEs to increase strength for functional mobility: 10 x 1 each tandem R and L foot placement, mini squats and calf raises.  LOB backwards during mini squats requiring mod assist to regain balance to prevent fall.  Nu Step at level 4 x 10 minutes, rated 13 on BORG scale.  UP/down 12 steps with R rail, supervision> min assist.  Pt unable to remember sequence, even with frequent VCs.     Therapy Documentation Precautions:  Precautions Precautions: Fall Restrictions Weight Bearing Restrictions: No      See FIM for current functional status  Therapy/Group: Individual Therapy  Jacqueline King 07/12/2011, 8:51 AM

## 2011-07-12 NOTE — Progress Notes (Signed)
Speech Language Pathology Daily Session Note & Weekly Progress Update  Patient Details  Name: Jacqueline King MRN: [id number] Date of Birth: [birth year]  Today's Date: 07/12/2011 Time: 4098-1191 Time Calculation (min): 45 min  Short Term Goals: Week 1: SLP Short Term Goal 1 (Week 1): Patient will utilize compensatory strategies at the conversational level of verbal expression with subtle cues to maintain 98% intelligibility SLP Short Term Goal 2 (Week 1): Patient will utilize memory compensatory aids to recall daily information with modified independence SLP Short Term Goal 3 (Week 1): Patient will demonstrate high level problem solving during functional tasks with suble cues  Skilled Therapeutic Interventions: Treatment session focused on skilled treatment of cognition; SLP facilitated session with moderate assist semantic cues (60%) to complete basic daily, functional math problems with some emergent awareness during and after the activity with minimal assist question cues.  Overall speech intelligibility throughout session was mod I however, during lunch order patient was asked to repeat herself by food ambassador.    FIM:  Comprehension Comprehension Mode: Auditory Comprehension: 5-Follows basic conversation/direction: With no assist Expression Expression Mode: Verbal Expression: 5-Expresses complex 90% of the time/cues < 10% of the time Social Interaction Social Interaction: 6-Interacts appropriately with others with medication or extra time (anti-anxiety, antidepressant). Problem Solving Problem Solving: 5-Solves basic 90% of the time/requires cueing < 10% of the time Memory Memory: 5-Recognizes or recalls 90% of the time/requires cueing < 10% of the time  Pain Pain Assessment Pain Assessment: No/denies pain Pain Score: 0-No pain  Therapy/Group: Individual Therapy   Speech Language Pathology Weekly Progress Note  Patient Details  Name: Jacqueline King MRN: [id number] Date of  Birth: Jan 05, 1946  Today's Date: 07/12/2011  Short Term Goals: Week 1: SLP Short Term Goal 1 (Week 1): Patient will utilize compensatory strategies at the conversational level of verbal expression with subtle cues to maintain 98% intelligibility SLP Short Term Goal 1 - Progress (Week 1): Met SLP Short Term Goal 2 (Week 1): Patient will utilize memory compensatory aids to recall daily information with modified independence SLP Short Term Goal 2 - Progress (Week 1): Progressing toward goal SLP Short Term Goal 3 (Week 1): Patient will demonstrate high level problem solving during functional tasks with suble cues SLP Short Term Goal 3 - Progress (Week 1): Progressing toward goal Week 2: SLP Short Term Goal 1 (Week 2): Patient will utilize compensatory strategies at the conversational level of verbal expression with increased wait to to self monitor and correct errors.   SLP Short Term Goal 2 (Week 2): Patient will demonstrate problem solving during moderately complex functional tasks with minimal assist semantic cues  SLP Short Term Goal 3 (Week 2): Patient will utilize memory compensatory aids to recall daily information with modified independence   Weekly Progress Updates: Patient has demonstrated functional gains this reporting period by meeting 1 out of 3 short term goals with gains in speech intelligibility at the conversational level.  Patient has also made functional gains in problem solving and recall with use of memory aids; however, requires more assistance in functional tasks due to decreased awareness of deficits. As a result goals have been revised and patient has been educated regarding recommendation for 24/7 supervision upon discharge and follow up with outpatient therapy.     SLP Frequency: 1-2 X/day, 30-60 minutes;5 out of 7 days Estimated Length of Stay: anticipated discharge date 07/16/11 SLP Treatment/Interventions: Cognitive remediation/compensation;Cueing hierarchy;Environmental  controls;Functional tasks;Internal/external aids;Patient/family education;Oral motor exercises;Speech/Language facilitation;Therapeutic Activities  Charlane Ferretti., CCC-SLP [phone]  [name] 07/12/2011, 9:54 AM

## 2011-07-12 NOTE — Progress Notes (Signed)
Patient ID: Jacqueline King, female   DOB: 10/15/1945, 66 y.o.   MRN: [id number] Subjective/Complaints: No new complaints.  Amb up to 200 ft but leans backward Review of Systems  No chest pain, SOB or abdominal pain. All other systems reviewed and are negative.   Objective: Vital Signs: Blood pressure 123/81, pulse 61, temperature 98.8 F (37.1 C), temperature source Oral, resp. rate 19, height 5.1" (0.13 m), weight 58.333 kg (128 lb 9.6 oz), SpO2 97.00%.  Last 5 Results:  Lab 07/10/11 0645 07/08/11 0720  WBC -- 4.8  HGB -- 11.0*  HCT -- 30.6*  PLT -- 171  NA 136 136  K 3.5 3.5  CL 97 98  CO2 27 26  GLUCOSE 99 94  BUN 17 19  CREATININE 0.88 0.89  CALCIUM 9.4 9.6    Physical Exam:  General appearance: alert, cooperative and no distress Resp: clear to auscultation bilaterally Cardio: regular rate and rhythm, S1, S2 normal, no murmur, click, rub or gallop GI: soft, non-tender; bowel sounds normal; no masses,  no organomegaly Extremities: extremities normal, atraumatic, no cyanosis or edema Skin: Skin color, texture, turgor normal. No rashes or lesions Neurologic: Grossly normal. Speech with dysarthria.  Ataxia with LUE finger to nose.  Cannot maintain single leg stance with R leg up holding on to walker Assessment/Plan: 1. Functional deficits secondary to R superior cerebellar vermis infarct which require 3+ hours per day of interdisciplinary therapy in a comprehensive inpatient rehab setting. Physiatrist is providing close team supervision and 24 hour management of active medical problems listed below. Physiatrist and rehab team continue to assess barriers to discharge/monitor patient progress toward functional and medical goals. FIM: FIM - Bathing Bathing Steps Patient Completed: Chest;Right Arm;Left Arm;Abdomen;Front perineal area;Buttocks;Right upper leg;Left upper leg;Left lower leg (including foot);Right lower leg (including foot) Bathing: 5: Supervision: Safety  issues/verbal cues  FIM - Upper Body Dressing/Undressing Upper body dressing/undressing steps patient completed: Thread/unthread right bra strap;Thread/unthread left bra strap;Hook/unhook bra;Thread/unthread right sleeve of pullover shirt/dresss;Thread/unthread left sleeve of pullover shirt/dress;Put head through opening of pull over shirt/dress;Pull shirt over trunk Upper body dressing/undressing: 5: Supervision: Safety issues/verbal cues FIM - Lower Body Dressing/Undressing Lower body dressing/undressing steps patient completed: Thread/unthread right underwear leg;Thread/unthread left underwear leg;Pull underwear up/down;Pull pants up/down;Thread/unthread left pants leg;Thread/unthread right pants leg;Don/Doff right sock;Don/Doff left sock;Don/Doff right shoe;Fasten/unfasten left shoe;Fasten/unfasten right shoe;Don/Doff left shoe Lower body dressing/undressing: 5: Supervision: Safety issues/verbal cues  FIM - Toileting Toileting steps completed by patient: Adjust clothing prior to toileting;Performs perineal hygiene;Adjust clothing after toileting Toileting Assistive Devices: Grab bar or rail for support Toileting: 4: Steadying assist  FIM - Diplomatic Services operational officer Devices: Grab bars;Walker Toilet Transfers: 4-To toilet/BSC: Min A (steadying Pt. > 75%);4-From toilet/BSC: Min A (steadying Pt. > 75%)  FIM - Bed/Chair Transfer Bed/Chair Transfer Assistive Devices: Therapist, occupational: 4: Chair or W/C > Bed: Min A (steadying Pt. > 75%);4: Bed > Chair or W/C: Min A (steadying Pt. > 75%)  FIM - Locomotion: Wheelchair Locomotion: Wheelchair: 0: Activity did not occur FIM - Locomotion: Ambulation Locomotion: Ambulation Assistive Devices: Designer, industrial/product Ambulation/Gait Assistance: 4: Min assist Locomotion: Ambulation: 4: Travels 150 ft or more with minimal assistance (Pt.>75%)  Comprehension Comprehension Mode: Auditory Comprehension: 5-Follows basic  conversation/direction: With extra time/assistive device  Expression Expression Mode: Verbal Expression: 5-Expresses complex 90% of the time/cues < 10% of the time  Social Interaction Social Interaction: 6-Interacts appropriately with others with medication or extra time (anti-anxiety, antidepressant).  Problem Solving Problem Solving:  4-Solves basic 75 - 89% of the time/requires cueing 10 - 24% of the time  Memory Memory: 4-Recognizes or recalls 75 - 89% of the time/requires cueing 10 - 24% of the time   Medical Problem List and Plan:  1. DVT Prophylaxis/Anticoagulation: Mechanical: Sequential compression devices, below knee Bilateral lower extremities  2. Pain Management: ultram prn for pain.  3. Mood: Monitor for now. LCSW to follow up for formal evalutation.  4. HTN: Continue qid checks for now --as SBP now in 140s range. Will continue to slowly adjust medication for better control. On norvasc, chlorthalidone, lisinopril and metoprolol with improving control.  Monitor for hypotension as meds get to steady state.   5. Dyslipidemia: Continue Lipitor.  6. Hypothyroid: On supplement.  7. Hypokalemia: Likely due to diuretics. Will recheck in am. 8. Heme positive stool 1/3: hemoglobin stable outpt f/u GI  7  Cheri Ayotte E 07/12/2011, 8:12 AM

## 2011-07-12 NOTE — Progress Notes (Signed)
Occupational Therapy Weekly Progress Note and Session Note  Patient Details  Name: Jacqueline King MRN: [id number] Date of Birth: [birth year]  Today's Date: 07/12/2011 Time: 1030-1130 Time Calculation (min): 60 min  1:1 Pt engaged in bathing at shower level and dressing with sit to stand from recliner. Pt amb with RW to gather clothing and supplies and enter bathroom for shower.  Pt continues to require min verbal cues for RW safety.  Pt completed all bathing and dressing tasks with supervision.  Pt performed toilet transfer and completed toileting with supervision.  Pt transitioned to ADL apartment to engage in home management tasks (changing bed linens) with supervision and verbal cues for safety.  Pt stated that she wants her balance to get better because she doesn't want to fall.  Patient has met 2 of 3 short term goals.  Pt is progressing steadily since admission and is supervision for bathing, dressing, toilet transfers and toileting.  Pt is supervision for walk-in shower transfer and min A for tub transfer.  Pt continues to require min verbal cues for RW safety awareness.    Patient continues to demonstrate the following deficits: instability during functional mob in a distracting environment, disorientated to location and is not able to reorient without assist (verbal cues).  Pt. Is  She will continue to benefit from skilled OT intervention to enhance overall performance with iADL.  Patient progressing toward long term goals..  Continue plan of care.  OT Short Term Goals Week 1:  OT Short Term Goal 1 (Week 1): 1.  Pt. will be supervision with bathing OT Short Term Goal 1 - Progress (Week 1): Met OT Short Term Goal 2 (Week 1): Pt. will be supervision with dressing.   OT Short Term Goal 2 - Progress (Week 1): Met OT Short Term Goal 3 (Week 1): Pt will be mod I with toileting OT Short Term Goal 3 - Progress (Week 1): Progressing toward goal      Therapy Documentation Precautions:    Precautions Precautions: Fall Restrictions Weight Bearing Restrictions: No   Pain: Pain Assessment Pain Assessment: No/denies pain Pain Score: 0-No pain  See FIM for current functional status  Therapy/Group: Individual Therapy  Rich Brave 07/12/2011, 1:26 PM

## 2011-07-13 DIAGNOSIS — Z5189 Encounter for other specified aftercare: Secondary | ICD-10-CM

## 2011-07-13 DIAGNOSIS — I69993 Ataxia following unspecified cerebrovascular disease: Secondary | ICD-10-CM

## 2011-07-13 DIAGNOSIS — I6330 Cerebral infarction due to thrombosis of unspecified cerebral artery: Secondary | ICD-10-CM

## 2011-07-13 MED ORDER — POTASSIUM CHLORIDE CRYS ER 20 MEQ PO TBCR
20.0000 meq | EXTENDED_RELEASE_TABLET | Freq: Every day | ORAL | Status: DC
  Administered 2011-07-13 – 2011-07-16 (×4): 20 meq via ORAL
  Filled 2011-07-13 (×5): qty 1

## 2011-07-13 NOTE — Progress Notes (Addendum)
Physical Therapy Session Note  Patient Details  Name: Jacqueline King MRN: [id number] Date of Birth: [birth year]  Today's Date: 07/13/2011 Time: 1000-1100 Time Calculation (min): 60 min  Short Term Goals: Week 1:  PT Short Term Goal 1 (Week 1): Patient will be able to perform bed mobility with Independent assist. PT Short Term Goal 1 - Progress (Week 1): Met PT Short Term Goal 2 (Week 1): Patient will be able to perform transfers with Mod-I assist. PT Short Term Goal 2 - Progress (Week 1): Progressing toward goal PT Short Term Goal 3 (Week 1): Patient will be able to perform gait using LRAD with Mod-I assist x 300' in home and community environment. PT Short Term Goal 3 - Progress (Week 1): Progressing toward goal PT Short Term Goal 4 (Week 1): Patient will be able to perform stairs, up/down 14 with Mod-I assist. PT Short Term Goal 4 - Progress (Week 1): Progressing toward goal  Skilled Therapeutic Interventions/Progress Updates:     Therapy Documentation Precautions:  Precautions Precautions: Fall Restrictions Weight Bearing Restrictions: No   Pain: Pain Assessment Pain Assessment: No/denies pain Pain Score: 0-No pain    Treatments:  Patient participated in dynamic gait and balance group with activities including gait training with RW, obstacle negotiation, sidestepping, threshold and curb step negotiation, and stepping over obstacles. Emphasis throughout on RW position and sequencing for increased safety and functional independence.  See FIM for current functional status  Therapy/Group: Group Therapy  Celene Pippins N 07/13/2011, 12:11 PM

## 2011-07-13 NOTE — Progress Notes (Signed)
Patient ID: Jacqueline King, female   DOB: August 12, 1945, 66 y.o.   MRN: [id number] Patient ID: Jacqueline King, female   DOB: [birth year], 66 y.o.   MRN: [id number] Subjective/Complaints: No new complaints.  No new complaints Review of Systems  No chest pain, SOB or abdominal pain. All other systems reviewed and are negative.   Objective: Vital Signs: Blood pressure 110/71, pulse 71, temperature 98.5 F (36.9 C), temperature source Oral, resp. rate 18, height 5.1" (0.13 m), weight 58.333 kg (128 lb 9.6 oz), SpO2 100.00%.  Last 5 Results:  Lab 07/10/11 0645 07/08/11 0720  WBC -- 4.8  HGB -- 11.0*  HCT -- 30.6*  PLT -- 171  NA 136 136  K 3.5 3.5  CL 97 98  CO2 27 26  GLUCOSE 99 94  BUN 17 19  CREATININE 0.88 0.89  CALCIUM 9.4 9.6    Physical Exam:  General appearance: alert, cooperative and no distress Resp: clear to auscultation bilaterally Cardio: regular rate and rhythm, S1, S2 normal, no murmur, click, rub or gallop GI: soft, non-tender; bowel sounds normal; no masses,  no organomegaly Extremities: extremities normal, atraumatic, no cyanosis or edema Skin: Skin color, texture, turgor normal. No rashes or lesions Neurologic: Grossly normal. Speech with dysarthria.  Ataxia with LUE finger to nose.  Cannot maintain single leg stance with R leg up holding on to walker. No changes Assessment/Plan: 1. Functional deficits secondary to R superior cerebellar vermis infarct which require 3+ hours per day of interdisciplinary therapy in a comprehensive inpatient rehab setting. Physiatrist is providing close team supervision and 24 hour management of active medical problems listed below. Physiatrist and rehab team continue to assess barriers to discharge/monitor patient progress toward functional and medical goals. FIM: FIM - Bathing Bathing Steps Patient Completed: Chest;Right Arm;Left Arm;Abdomen;Front perineal area;Buttocks;Right upper leg;Left upper leg;Left lower leg (including  foot);Right lower leg (including foot) Bathing: 5: Supervision: Safety issues/verbal cues  FIM - Upper Body Dressing/Undressing Upper body dressing/undressing steps patient completed: Thread/unthread right bra strap;Thread/unthread left bra strap;Hook/unhook bra;Thread/unthread right sleeve of pullover shirt/dresss;Thread/unthread left sleeve of pullover shirt/dress;Put head through opening of pull over shirt/dress;Pull shirt over trunk Upper body dressing/undressing: 5: Supervision: Safety issues/verbal cues FIM - Lower Body Dressing/Undressing Lower body dressing/undressing steps patient completed: Thread/unthread right underwear leg;Thread/unthread left underwear leg;Pull underwear up/down;Pull pants up/down;Thread/unthread left pants leg;Thread/unthread right pants leg;Don/Doff right sock;Don/Doff left sock;Don/Doff right shoe;Fasten/unfasten left shoe;Fasten/unfasten right shoe;Don/Doff left shoe Lower body dressing/undressing: 5: Supervision: Safety issues/verbal cues  FIM - Toileting Toileting steps completed by patient: Adjust clothing prior to toileting;Performs perineal hygiene;Adjust clothing after toileting Toileting Assistive Devices: Grab bar or rail for support Toileting: 5: Supervision: Safety issues/verbal cues  FIM - Diplomatic Services operational officer Devices: Art gallery manager Transfers: 5-Set-up assist to: Apply orthosis/W/C setup;5-From toilet/BSC: Supervision (verbal cues/safety issues)  FIM - Banker Devices: Therapist, occupational: 7: Supine > Sit: No assist;7: Sit > Supine: No assist;5: Bed > Chair or W/C: Supervision (verbal cues/safety issues);5: Chair or W/C > Bed: Supervision (verbal cues/safety issues)  FIM - Locomotion: Wheelchair Locomotion: Wheelchair: 0: Activity did not occur FIM - Locomotion: Ambulation Locomotion: Ambulation Assistive Devices: Designer, industrial/product Ambulation/Gait Assistance: 4: Min  assist Locomotion: Ambulation: 4: Travels 150 ft or more with minimal assistance (Pt.>75%)  Comprehension Comprehension Mode: Auditory Comprehension: 5-Follows basic conversation/direction: With no assist  Expression Expression Mode: Verbal Expression: 5-Expresses complex 90% of the time/cues < 10% of the time  Social Interaction Social Interaction: 6-Interacts appropriately  with others with medication or extra time (anti-anxiety, antidepressant).  Problem Solving Problem Solving: 5-Solves basic 90% of the time/requires cueing < 10% of the time  Memory Memory: 5-Recognizes or recalls 90% of the time/requires cueing < 10% of the time   Medical Problem List and Plan:  1. DVT Prophylaxis/Anticoagulation: Mechanical: Sequential compression devices, below knee Bilateral lower extremities  2. Pain Management: ultram prn for pain.  3. Mood: Monitor for now. Mood upbeat 4. HTN: Continue qid checks for now -. On norvasc, chlorthalidone, lisinopril and metoprolol with improving control.  Monitor for hypotension as meds get to steady state.   5. Dyslipidemia: Continue Lipitor.  6. Hypothyroid: On supplement.  7. Hypokalemia: d/t diuretics. ? Daily potassium supplement 8. Heme positive stool 1/3: hemoglobin stable outpt f/u GI  8  Jacqueline King T 07/13/2011, 6:22 AM

## 2011-07-14 NOTE — Progress Notes (Signed)
Occupational Therapy Session Note  Patient Details  Name: Jacqueline King MRN: [id number] Date of Birth: 28-Apr-1945  Today's Date: 07/14/2011 Time:  -   1030 -1125  ( )    Short Term Goals: Week 2:     Skilled Therapeutic Interventions/Progress Updates:    Engaged in bathing and dressing at shower level.  Pt. Ambulated with RW to shower area and transfered to bench with supervision.  Asked pt where she was going to get dressed after she gathered her clothes.  She replied on the bed, but after finishing her bath, she wanted OT to bring her the clothes in the bathroom.   Reminded her what she had planned and she went out to the bed to dress  Educated pt on proper hand placement for sit to stand and she followed through the rest of the session. Pt. Stood at sink for grooming with supervision.  Pt. Needed cues to pick up clothes and put in dirty drawer.     Therapy Documentation Precautions:  Precautions Precautions: Fall Restrictions Weight Bearing Restrictions: No     Pain:none         See FIM for current functional status  Therapy/Group: Individual Therapy  Humberto Seals 07/14/2011, 10:59 AM

## 2011-07-14 NOTE — Progress Notes (Signed)
Subjective/Complaints: No new complaints.  No new complaints Review of Systems  No chest pain, SOB or abdominal pain. All other systems reviewed and are negative.   Objective: Vital Signs: Blood pressure 112/71, pulse 62, temperature 98.4 F (36.9 C), temperature source Oral, resp. rate 16, height 5.1" (0.13 m), weight 58.333 kg (128 lb 9.6 oz), SpO2 97.00%.  Last 5 Results:  Lab 07/10/11 0645 07/08/11 0720  WBC -- 4.8  HGB -- 11.0*  HCT -- 30.6*  PLT -- 171  NA 136 136  K 3.5 3.5  CL 97 98  CO2 27 26  GLUCOSE 99 94  BUN 17 19  CREATININE 0.88 0.89  CALCIUM 9.4 9.6    Physical Exam:  General appearance: alert, cooperative and no distress Resp: clear to auscultation bilaterally Cardio: regular rate and rhythm, S1, S2 normal, no murmur, click, rub or gallop GI: soft, non-tender; bowel sounds normal; no masses,  no organomegaly Extremities: extremities normal, atraumatic, no cyanosis or edema Skin: Skin color, texture, turgor normal. No rashes or lesions Neurologic: Grossly normal. Speech with dysarthria.  Ataxia with LUE finger to nose. Cognitively displays good insight, awareness, and memory Cannot maintain single leg stance with R leg up holding on to walker. No changes Assessment/Plan: 1. Functional deficits secondary to R superior cerebellar vermis infarct which require 3+ hours per day of interdisciplinary therapy in a comprehensive inpatient rehab setting. Physiatrist is providing close team supervision and 24 hour management of active medical problems listed below. Physiatrist and rehab team continue to assess barriers to discharge/monitor patient progress toward functional and medical goals. FIM: FIM - Bathing Bathing Steps Patient Completed: Chest;Right Arm;Left Arm;Abdomen;Front perineal area;Buttocks;Right upper leg;Left upper leg;Left lower leg (including foot);Right lower leg (including foot) Bathing: 5: Supervision: Safety issues/verbal cues  FIM - Upper  Body Dressing/Undressing Upper body dressing/undressing steps patient completed: Thread/unthread right bra strap;Thread/unthread left bra strap;Hook/unhook bra;Thread/unthread right sleeve of pullover shirt/dresss;Thread/unthread left sleeve of pullover shirt/dress;Put head through opening of pull over shirt/dress;Pull shirt over trunk Upper body dressing/undressing: 5: Supervision: Safety issues/verbal cues FIM - Lower Body Dressing/Undressing Lower body dressing/undressing steps patient completed: Thread/unthread right underwear leg;Thread/unthread left underwear leg;Pull underwear up/down;Pull pants up/down;Thread/unthread left pants leg;Thread/unthread right pants leg;Don/Doff right sock;Don/Doff left sock;Don/Doff right shoe;Fasten/unfasten left shoe;Fasten/unfasten right shoe;Don/Doff left shoe Lower body dressing/undressing: 5: Supervision: Safety issues/verbal cues  FIM - Toileting Toileting steps completed by patient: Adjust clothing prior to toileting;Performs perineal hygiene;Adjust clothing after toileting Toileting Assistive Devices: Grab bar or rail for support Toileting: 5: Supervision: Safety issues/verbal cues  FIM - Diplomatic Services operational officer Devices: Art gallery manager Transfers: 5-Set-up assist to: Apply orthosis/W/C setup;5-From toilet/BSC: Supervision (verbal cues/safety issues)  FIM - Banker Devices: Therapist, occupational: 7: Supine > Sit: No assist;7: Sit > Supine: No assist;5: Bed > Chair or W/C: Supervision (verbal cues/safety issues);5: Chair or W/C > Bed: Supervision (verbal cues/safety issues)  FIM - Locomotion: Wheelchair Locomotion: Wheelchair: 0: Activity did not occur FIM - Locomotion: Ambulation Locomotion: Ambulation Assistive Devices: Designer, industrial/product Ambulation/Gait Assistance: 4: Min assist Locomotion: Ambulation: 4: Travels 150 ft or more with minimal assistance  (Pt.>75%)  Comprehension Comprehension Mode: Auditory Comprehension: 5-Follows basic conversation/direction: With no assist  Expression Expression Mode: Verbal Expression: 5-Expresses complex 90% of the time/cues < 10% of the time  Social Interaction Social Interaction: 6-Interacts appropriately with others with medication or extra time (anti-anxiety, antidepressant).  Problem Solving Problem Solving: 5-Solves basic 90% of the time/requires cueing < 10% of  the time  Memory Memory: 5-Recognizes or recalls 90% of the time/requires cueing < 10% of the time   Medical Problem List and Plan:  1. DVT Prophylaxis/Anticoagulation: Mechanical: Sequential compression devices, below knee Bilateral lower extremities  2. Pain Management: ultram prn for pain.  3. Mood: Monitor for now. Mood upbeat 4. HTN: Continue qid checks for now -. On norvasc, chlorthalidone, lisinopril and metoprolol with improving control.  Monitor for hypotension as meds get to steady state.   5. Dyslipidemia: Continue Lipitor.  6. Hypothyroid: On supplement.  7. Hypokalemia: d/t diuretics.  Daily potassium supplement added 8. Heme positive stool 1/3: hemoglobin stable outpt f/u GI  9  Jacqueline King T 07/14/2011, 7:55 AM

## 2011-07-15 DIAGNOSIS — I69993 Ataxia following unspecified cerebrovascular disease: Secondary | ICD-10-CM

## 2011-07-15 DIAGNOSIS — I6330 Cerebral infarction due to thrombosis of unspecified cerebral artery: Secondary | ICD-10-CM

## 2011-07-15 DIAGNOSIS — Z5189 Encounter for other specified aftercare: Secondary | ICD-10-CM

## 2011-07-15 NOTE — Progress Notes (Signed)
Occupational Therapy Discharge Summary  Patient Details  Name: Jacqueline King MRN: [id number] Date of Birth: October 06, 1945  Today's Date: 07/17/2011 Time:  -  and 1100-1130   and 30 minutes  1:1  1.  Pt seen for BADL retraining of toileting, bathing, and dressing with a focus on family education with patient and her daughter.  Pt ambulated from room to ADL apt. With RW with close supervision. Pt used tub bench in tub to shower.  Pt needed supervision with transfers with RW and supervision with bathing as pt stands in shower.  She was able to complete toileting and dressing with mod I, no verbal cues required. Discussed with daughter reasons for such close supervision and she was able to observe the cognitive delays the patient had, such as delayed processing, inattention, impulsivity.  Pt was able to complete grooming activities in standing with no LOB. Pt then ambulated back to room. 2. Pt seen for family ed with daughter with a focus on meal prep skills and functional mobility in the kitchen with and without RW.  Pt prepared eggs all with supervision for occasional verbal cues for steps, safety awareness.  Pt's daughter provided all the cuing and demonstrates excellent skill in providing the necessary supervision/ cueing to assist her mother.  Provided pt with a fine motor activity handout for home use.  Discussed need for pt to have very close supervision with these tasks.    Patient has met 6 of 10 long term goals due to improved activity tolerance, improved balance, postural control, functional use of  RIGHT upper extremity, improved attention, improved awareness and improved coordination.  Patient to discharge at overall Supervision level.  Patient's care partner is independent to provide the necessary physical and cognitive assistance at discharge.    Reasons goals not met: Goals were set at Mod I for bathing, toilet transfers, laundry, and light housekeeping.   Pt will need supervision with these  tasks due to cognitive delays and decreased dynamic standing balance.  Recommendation:  Patient will benefit from ongoing skilled OT services in outpatient setting to continue to advance functional skills in the area of BADL, iADL and Vocation.  Equipment: transfer tub bench  Reasons for discharge: treatment goals met  Patient/family agrees with progress made and goals achieved: Yes  OT Discharge ADL Refer to FIM      See FIM for current functional status  Jemar Paulsen 07/17/2011, 8:02 AM

## 2011-07-15 NOTE — Discharge Instructions (Signed)
Inpatient Rehab Discharge Instructions  Jacqueline King Discharge date and time:    Activities/Precautions/ Functional Status: Activity: activity as tolerated Diet: regular diet Wound Care: none needed Functional status:  ___ No restrictions     ___ Walk up steps independently __x_ 24/7 supervision/assistance   ___x Walk up steps with assistance ___ Intermittent supervision/assistance  ___ Bathe/dress independently ___ Walk with walker     ___ Bathe/dress with assistance ___ Walk Independently    ___ Shower independently __x_ Walk with assistance    __x STROKE/TIA DISCHARGE INSTRUCTIONS SMOKING Cigarette smoking nearly doubles your risk of having a stroke & is the single most alterable risk factor  If you smoke or have smoked in the last 12 months, you are advised to quit smoking for your health.  Most of the excess cardiovascular risk related to smoking disappears within a year of stopping.  Ask you doctor about anti-smoking medications  [place] Quit Line: 1-800-QUIT NOW  Free Smoking Cessation Classes ([phone]  CHOLESTEROL Know your levels; limit fat & cholesterol in your diet  Lab Results  Component Value Date   CHOL 329* 07/02/2011   HDL 111 07/02/2011   LDLCALC 208* 07/02/2011   TRIG 52 07/02/2011   CHOLHDL 3.0 07/02/2011      Many patients benefit from treatment even if their cholesterol is at goal.  Goal: Total Cholesterol less than 160  Goal:  LDL less than 100  Goal:  HDL greater than 40  Goal:  Triglycerides less than 150  BLOOD PRESSURE American Stroke Association blood pressure target is less that 120/80 mm/Hg  Your discharge blood pressure is:  BP: 120/85 mmHg  Monitor your blood pressure  Limit your salt and alcohol intake  Many individuals will require more than one medication for high blood pressure  DIABETES (A1c is a blood sugar average for last 3 months) Goal A1c is under 7% (A1c is blood sugar average for last 3 months)  Diabetes: No known diagnosis of  diabetes    Lab Results  Component Value Date   HGBA1C 6.2* 07/01/2011    Your A1c can be lowered with medications, healthy diet, and exercise.  Check your blood sugar as directed by your physician  Call your physician if you experience unexplained or low blood sugars.  PHYSICAL ACTIVITY/REHABILITATION Goal is 30 minutes at least 4 days per week    Activity decreases your risk of heart attack and stroke and makes your heart stronger.  It helps control your weight and blood pressure; helps you relax and can improve your mood.  Participate in a regular exercise program.  Talk with your doctor about the best form of exercise for you (dancing, walking, swimming, cycling).  DIET/WEIGHT Goal is to maintain a healthy weight  Your height is:  Height: 5.1" (13 cm) Your current weight is: Weight: 58.333 kg (128 lb 9.6 oz) Your body Mass Index (BMI) is:  BMI (Calculated): 3499.4   Following the type of diet specifically designed for you will help prevent another stroke.  Your goal Body Mass Index (BMI) is 19-24.  Healthy food habits can help reduce 3 risk factors for stroke:  High cholesterol, hypertension, and excess weight.     _ Shower with assistance ___ No alcohol     ___ Return to work/school ________  Special Instructions:    COMMUNITY REFERRALS UPON DISCHARGE:    Outpatient: PT, OT, SPT  Agency:[facility] Phone:[phone] Date of Last Service:07/16/2011  Appointment Date/Time:6/25 Tuesday 7:45-9:45 AM  Medical Equipment/Items  Ordered:YOUTH Levan Hurst, TUB BENCH  Agency/Supplier:[facility]   [phone]   GENERAL COMMUNITY RESOURCES FOR PATIENT/FAMILY: Support Groups:[facility] SUPPORT GROUP   STROKE/TIA DISCHARGE INSTRUCTIONS SMOKING Cigarette smoking nearly doubles your risk of having a stroke & is the single most alterable risk factor  If you smoke or have smoked in the last 12 months, you are advised to quit smoking for your health.  Most of the excess  cardiovascular risk related to smoking disappears within a year of stopping.  Ask you doctor about anti-smoking medications  [place] Quit Line: 1-800-QUIT NOW  Free Smoking Cessation Classes ([phone]  CHOLESTEROL Know your levels; limit fat & cholesterol in your diet  Lab Results  Component Value Date   CHOL 329* 07/02/2011   HDL 111 07/02/2011   LDLCALC 208* 07/02/2011   TRIG 52 07/02/2011   CHOLHDL 3.0 07/02/2011      Many patients benefit from treatment even if their cholesterol is at goal.  Goal: Total Cholesterol less than 160  Goal:  LDL less than 100  Goal:  HDL greater than 40  Goal:  Triglycerides less than 150  BLOOD PRESSURE American Stroke Association blood pressure target is less that 120/80 mm/Hg  Your discharge blood pressure is:  BP: 146/80 mmHg  Monitor your blood pressure  Limit your salt and alcohol intake  Many individuals will require more than one medication for high blood pressure  DIABETES (A1c is a blood sugar average for last 3 months) Goal A1c is under 7% (A1c is blood sugar average for last 3 months)  Diabetes: No known diagnosis of diabetes    Lab Results  Component Value Date   HGBA1C 6.2* 07/01/2011    Your A1c can be lowered with medications, healthy diet, and exercise.  Check your blood sugar as directed by your physician  Call your physician if you experience unexplained or low blood sugars.  PHYSICAL ACTIVITY/REHABILITATION Goal is 30 minutes at least 4 days per week    Activity decreases your risk of heart attack and stroke and makes your heart stronger.  It helps control your weight and blood pressure; helps you relax and can improve your mood.  Participate in a regular exercise program.  Talk with your doctor about the best form of exercise for you (dancing, walking, swimming, cycling).  DIET/WEIGHT Goal is to maintain a healthy weight  Your height is:  Height: 5.1" (13 cm) Your current weight is: Weight: 58.6 kg (129 lb 3 oz) Your  body Mass Index (BMI) is:  BMI (Calculated): 3499.4   Following the type of diet specifically designed for you will help prevent another stroke.  Your goal Body Mass Index (BMI) is 19-24.  Healthy food habits can help reduce 3 risk factors for stroke:  High cholesterol, hypertension, and excess weight.       My questions have been answered and I understand these instructions. I will adhere to these goals and the provided educational materials after my discharge from the hospital.  Patient/Caregiver Signature _______________________________ Date __________  Clinician Signature _______________________________________ Date __________  Please bring this form and your medication list with you to all your follow-up doctor's appointments.

## 2011-07-15 NOTE — Progress Notes (Signed)
Patient continent of bowel and bladder,  LBM 6/14.  Patient refused laxative when offered.

## 2011-07-15 NOTE — Progress Notes (Signed)
Patient ID: Jacqueline King, female   DOB: 1945/04/04, 66 y.o.   MRN: [id number] Subjective/Complaints: No new complaints.  Has to find primary MD Review of Systems  No chest pain, SOB or abdominal pain. All other systems reviewed and are negative.   Objective: Vital Signs: Blood pressure 120/85, pulse 57, temperature 97.8 F (36.6 C), temperature source Oral, resp. rate 18, height 5.1" (0.13 m), weight 58.333 kg (128 lb 9.6 oz), SpO2 90.00%.  Last 5 Results:  Lab 07/10/11 0645  WBC --  HGB --  HCT --  PLT --  NA 136  K 3.5  CL 97  CO2 27  GLUCOSE 99  BUN 17  CREATININE 0.88  CALCIUM 9.4    Physical Exam:  General appearance: alert, cooperative and no distress Resp: clear to auscultation bilaterally Cardio: regular rate and rhythm, S1, S2 normal, no murmur, click, rub or gallop GI: soft, non-tender; bowel sounds normal; no masses,  no organomegaly Extremities: extremities normal, atraumatic, no cyanosis or edema Skin: Skin color, texture, turgor normal. No rashes or lesions Neurologic: Grossly normal. Speech with dysarthria.  Ataxia with LUE finger to nose. Cognitively displays good insight, awareness, and memory Cannot maintain single leg stance with R leg up holding on to walker. No changes Assessment/Plan: 1. Functional deficits secondary to R superior cerebellar vermis infarct which require 3+ hours per day of interdisciplinary therapy in a comprehensive inpatient rehab setting. Physiatrist is providing close team supervision and 24 hour management of active medical problems listed below. Physiatrist and rehab team continue to assess barriers to discharge/monitor patient progress toward functional and medical goals. FIM: FIM - Bathing Bathing Steps Patient Completed: Chest;Right Arm;Left Arm;Abdomen;Front perineal area;Buttocks;Right upper leg;Left upper leg;Left lower leg (including foot);Right lower leg (including foot) Bathing: 5: Supervision: Safety issues/verbal  cues  FIM - Upper Body Dressing/Undressing Upper body dressing/undressing steps patient completed: Thread/unthread right bra strap;Thread/unthread left bra strap;Hook/unhook bra;Thread/unthread right sleeve of pullover shirt/dresss;Thread/unthread left sleeve of pullover shirt/dress;Put head through opening of pull over shirt/dress;Pull shirt over trunk Upper body dressing/undressing: 5: Supervision: Safety issues/verbal cues FIM - Lower Body Dressing/Undressing Lower body dressing/undressing steps patient completed: Thread/unthread right underwear leg;Thread/unthread left underwear leg;Pull underwear up/down;Pull pants up/down;Thread/unthread left pants leg;Thread/unthread right pants leg;Don/Doff right sock;Don/Doff left sock;Don/Doff right shoe;Fasten/unfasten left shoe;Fasten/unfasten right shoe;Don/Doff left shoe Lower body dressing/undressing: 5: Supervision: Safety issues/verbal cues  FIM - Toileting Toileting steps completed by patient: Adjust clothing prior to toileting;Performs perineal hygiene;Adjust clothing after toileting Toileting Assistive Devices: Grab bar or rail for support Toileting: 5: Supervision: Safety issues/verbal cues  FIM - Diplomatic Services operational officer Devices: Art gallery manager Transfers: 5-To toilet/BSC: Supervision (verbal cues/safety issues);5-From toilet/BSC: Supervision (verbal cues/safety issues)  FIM - Banker Devices: Therapist, occupational: 7: Supine > Sit: No assist  FIM - Locomotion: Wheelchair Locomotion: Wheelchair: 0: Activity did not occur FIM - Locomotion: Ambulation Locomotion: Ambulation Assistive Devices: Designer, industrial/product Ambulation/Gait Assistance: 4: Min assist Locomotion: Ambulation: 4: Travels 150 ft or more with minimal assistance (Pt.>75%)  Comprehension Comprehension Mode: Auditory Comprehension: 5-Follows basic conversation/direction: With extra time/assistive  device  Expression Expression Mode: Verbal Expression: 5-Expresses complex 90% of the time/cues < 10% of the time  Social Interaction Social Interaction: 6-Interacts appropriately with others with medication or extra time (anti-anxiety, antidepressant).  Problem Solving Problem Solving: 5-Solves basic 90% of the time/requires cueing < 10% of the time  Memory Memory: 5-Recognizes or recalls 90% of the time/requires cueing < 10% of the time  Medical Problem List and Plan:  1. DVT Prophylaxis/Anticoagulation: Mechanical: Sequential compression devices, below knee Bilateral lower extremities  2. Pain Management: ultram prn for pain.  3. Mood: Monitor for now. Mood upbeat 4. HTN: Continue qid checks for now -. On norvasc, chlorthalidone, lisinopril and metoprolol with improving control.  Monitor for hypotension as meds get to steady state.   5. Dyslipidemia: Continue Lipitor.  6. Hypothyroid: On supplement.  7. Hypokalemia: d/t diuretics.  Daily potassium supplement added 8. Heme positive stool 1/3: hemoglobin stable outpt f/u GI  10  Jacqueline King 07/15/2011, 8:16 AM

## 2011-07-15 NOTE — Progress Notes (Signed)
Physical Therapy Discharge Summary  Patient Details  Name: Jacqueline King MRN: [id number] Date of Birth: February 09, 1945  Today's Date: 07/15/2011 Time: 14:00-15:00 Time Calculation (min): 60 mins.  Session #1 family education complete for gait, balance, HEP, steps, floor transfer, and cognition. Daughter was educated on technique to increase independence with problem solving and compensatory strategies for memory. Pt ambulated >150' with rw supervision, performed 12 steps with 2 rails step to pattern , and repeated Berg. See note below. Jacqueline King, PTA  Patient has met 6 of 6 long term goals due to improved activity tolerance, improved balance, increased strength and improved awareness.  Patient to discharge at an ambulatory level Supervision.   Patient's care partner is independent to provide the necessary supervision for cognition and balance at discharge. Pt will d/c home with 24 hour supervision using a rw. Pt gained 10 points on the Berg balance assessment from 24/56 to 34/56 demonstrating less of a fall risk. Pt presents with decreased intellectual and emergent awareness for poor balance reactions and difficulty with problem solving, memory, and alternating attention.  Reasons goals not met: n/a  Recommendation:  Patient will benefit from ongoing skilled PT services in outpatient setting to continue to advance safe functional mobility, address ongoing impairments in balance and protective responses, awareness, strength, motor control, and minimize fall risk.  Equipment: rw  Reasons for discharge: discharge from hospital  Patient/family agrees with progress made and goals achieved: Yes  PT Discharge Precautions/Restrictions Precautions Precautions: Fall Vital Signs Therapy Vitals Temp: 97.7 F (36.5 C) Temp src: Oral Pulse Rate: 56  Resp: 17  BP: 148/83 mmHg Patient Position, if appropriate: Sitting Oxygen Therapy SpO2: 98 % O2 Device: None (Room air) Pain Pain  Assessment Pain Assessment: No/denies pain Pain Score: 0-No pain Vision/Perception  Vision - History Baseline Vision: Wears glasses all the time Patient Visual Report: Blurring of vision Perception Perception: Within Functional Limits Praxis Praxis: Intact  Cognition Overall Cognitive Status: Impaired Sensation Sensation Light Touch: Appears Intact Motor  Motor Motor: Ataxia Motor - Discharge Observations: decreased control of hips in stance phase with tendency to lock both knees into hyperextension with standing limiting balance reations.  Mobility Bed Mobility Rolling Right: 6: Modified independent (Device/Increase time) Supine to Sit: 6: Modified independent (Device/Increase time) Sit to Supine: 6: Modified independent (Device/Increase time) Transfers Sit to Stand: 5: Supervision Stand to Sit: 5: Supervision Locomotion  Ambulation Ambulation: Yes Ambulation/Gait Assistance: 5: Supervision Ambulation Distance (Feet): 150 Feet Assistive device: Rolling walker Gait Gait: Yes Gait Pattern: Impaired Gait Pattern: Ataxic Stairs / Additional Locomotion Stairs: Yes Stairs Assistance: 5: Supervision Stair Management Technique: Two rails;Step to pattern Number of Stairs: 12  Ramp: Not tested (comment) Curb: Not tested (comment) Wheelchair Mobility Wheelchair Mobility: No  Trunk/Postural Assessment  Cervical Assessment Cervical Assessment: Within Functional Limits Thoracic Assessment Thoracic Assessment: Within Functional Limits Lumbar Assessment Lumbar Assessment: Within Functional Limits Postural Control Postural Control: Within Functional Limits  Balance Standardized Balance Assessment Standardized Balance Assessment: Berg Balance Test Berg Balance Test Sit to Stand: Able to stand without using hands and stabilize independently Standing Unsupported: Able to stand 2 minutes with supervision Sitting with Back Unsupported but Feet Supported on Floor or Stool:  Able to sit safely and securely 2 minutes Stand to Sit: Sits safely with minimal use of hands Transfers: Able to transfer with verbal cueing and /or supervision Standing Unsupported with Eyes Closed: Able to stand 10 seconds with supervision Standing Ubsupported with Feet Together: Able to place feet together independently  and stand for 1 minute with supervision From Standing, Reach Forward with Outstretched Arm: Can reach forward >5 cm safely (2") From Standing Position, Pick up Object from Floor: Able to pick up shoe, needs supervision From Standing Position, Turn to Look Behind Over each Shoulder: Needs supervision when turning Turn 360 Degrees: Needs close supervision or verbal cueing Standing Unsupported, Alternately Place Feet on Step/Stool: Able to complete >2 steps/needs minimal assist Standing Unsupported, One Foot in Front: Able to take small step independently and hold 30 seconds Standing on One Leg: Tries to lift leg/unable to hold 3 seconds but remains standing independently Total Score: 34   Pt demonstrates very slow ankle strategies, absent hip strategies, and delayed stepping strategies.  With LOB backwards, she extends UEs behind her, but does not demonstrate hip strategy, and ankle strategy is insufficient.  Extremity Assessment      RLE Assessment RLE Assessment: Within Functional Limits LLE Assessment LLE Assessment: Within Functional Limits  See FIM for current functional status  Julian Reil 07/15/2011, 4:42 PM

## 2011-07-15 NOTE — Progress Notes (Signed)
Speech Language Pathology Daily Session Note  Patient Details  Name: Jacqueline King MRN: [id number] Date of Birth: 1945/08/02  Today's Date: 07/15/2011 Time: 1015-1100 Time Calculation (min): 45 min  Short Term Goals: Week 2: SLP Short Term Goal 1 (Week 2): Patient will utilize compensatory strategies at the conversational level of verbal expression with increased wait to to self monitor and correct errors.   SLP Short Term Goal 2 (Week 2): Patient will demonstrate problem solving during moderately complex functional tasks with minimal assist semantic cues SLP Short Term Goal 3 (Week 2): Patient will utilize memory compensatory aids to recall daily information with modified independence  SLP Short Term Goal 4 (Week 2): Patient will request help as needed with minimal assist semantic cues  Skilled Therapeutic Interventions: Treatment session focused on skilled treatment of cognition with focus on familiy education with daugher present; SLP facilitated session by providing increased wait time for paitent to verabally express herself with 98% intelligibility during conversation.  SLP also facilitated session by requesting patietn label deficits and then list some things she may need help with at home; patient required moderate assist to label deficits and max assist to anticipate needs after dscharge.  SLp educated daugher that the family will need to anticipate her needs after discharge and not assume she will ask for help due to her overall decreased safety awareness and ability to problem solve.     FIM:  Comprehension Comprehension Mode: Auditory Comprehension: 5-Follows basic conversation/direction: With extra time/assistive device Expression Expression Mode: Verbal Expression: 6-Expresses complex ideas: With extra time/assistive device Social Interaction Social Interaction: 6-Interacts appropriately with others with medication or extra time (anti-anxiety, antidepressant). Problem  Solving Problem Solving: 5-Solves basic 90% of the time/requires cueing < 10% of the time Memory Memory: 5-Recognizes or recalls 90% of the time/requires cueing < 10% of the time FIM - Eating Eating Activity: 7: Complete independence:no helper  Pain Pain Assessment Pain Assessment: No/denies pain Pain Score: 0-No pain  Therapy/Group: Individual Therapy  Charlane Ferretti., CCC-SLP [phone]  [name] 07/15/2011, 1:55 PM

## 2011-07-15 NOTE — Progress Notes (Signed)
Educate and provide patient with handouts on hypertension and stroke.  Patient verbalized understanding normal range for blood pressure, when to check blood pressure, diet for HTN, sign/symptoms of stroke, and when to get help.

## 2011-07-16 DIAGNOSIS — Z5189 Encounter for other specified aftercare: Secondary | ICD-10-CM

## 2011-07-16 DIAGNOSIS — I6330 Cerebral infarction due to thrombosis of unspecified cerebral artery: Secondary | ICD-10-CM

## 2011-07-16 DIAGNOSIS — I69993 Ataxia following unspecified cerebrovascular disease: Secondary | ICD-10-CM

## 2011-07-16 MED ORDER — FERROUS SULFATE 325 (65 FE) MG PO TABS: 325.0000 mg | ORAL_TABLET | Freq: Two times a day (BID) | ORAL | Status: DC

## 2011-07-16 MED ORDER — METOPROLOL SUCCINATE ER 25 MG PO TB24: 25.0000 mg | ORAL_TABLET | Freq: Every day | ORAL | Status: DC

## 2011-07-16 MED ORDER — AMLODIPINE BESYLATE 5 MG PO TABS: 5.0000 mg | ORAL_TABLET | Freq: Every day | ORAL | Status: DC

## 2011-07-16 MED ORDER — LISINOPRIL 20 MG PO TABS: 20.0000 mg | ORAL_TABLET | Freq: Two times a day (BID) | ORAL | Status: DC

## 2011-07-16 MED ORDER — CHLORTHALIDONE 25 MG PO TABS: 25.0000 mg | ORAL_TABLET | Freq: Every day | ORAL | Status: DC

## 2011-07-16 MED ORDER — POTASSIUM CHLORIDE CRYS ER 20 MEQ PO TBCR: 20.0000 meq | EXTENDED_RELEASE_TABLET | Freq: Every day | ORAL | Status: DC

## 2011-07-16 MED ORDER — LEVOTHYROXINE SODIUM 25 MCG PO TABS: 25.0000 ug | ORAL_TABLET | Freq: Every day | ORAL | Status: DC

## 2011-07-16 MED ORDER — TRAMADOL HCL 50 MG PO TABS: 50.0000 mg | ORAL_TABLET | Freq: Four times a day (QID) | ORAL | Status: AC | PRN

## 2011-07-16 MED ORDER — PANTOPRAZOLE SODIUM 40 MG PO TBEC: 40.0000 mg | DELAYED_RELEASE_TABLET | Freq: Every day | ORAL | Status: DC

## 2011-07-16 MED ORDER — ATORVASTATIN CALCIUM 40 MG PO TABS: 40.0000 mg | ORAL_TABLET | Freq: Every day | ORAL | Status: DC

## 2011-07-16 NOTE — Progress Notes (Signed)
Speech Language Pathology Daily Session Note  Patient Details  Name: Jacqueline King MRN: [id number] Date of Birth: [birth year]  Today's Date: 07/16/2011 Time: 1030-1200 Time Calculation (min): 90 min  Skilled Therapeutic Interventions: Treatment goals addressed on community outing with focus on facilitating safety with mobility and compensation for cognition.  For details, see community outing goal sheet in shadow chart.    Pain Pain Assessment Pain Assessment: No/denies pain Pain Score: 0-No pain  Charlane Ferretti., CCC-SLP [phone]  Yohann Curl 07/16/2011, 3:55 PM

## 2011-07-16 NOTE — Progress Notes (Signed)
Speech Language Pathology Discharge Summary  Patient Details  Name: Jacqueline King MRN: [id number] Date of Birth: 30-May-1945  Today's Date: 07/16/2011  Patient has met 1 of 3 long term goals.  Patient to discharge at overall Supervision;Min level.  Reasons goals not met: Patient contiues to demonstrate decreased ability to problem solve and self monitor as a result she continues to require supervision-min assist with mderate complex problem solving and use of memory comensatory/external aids.     Clinical Impression/Discharge Summary: Patient has demonstrated fuctional gains in expressive communication abilities and has progressed from min assist to ocassional need for subtle cues as well as overall cognition and has progressed from max assist to supervision-min assist.    Care Partner:  Caregiver Able to Provide Assistance: Yes  Type of Caregiver Assistance: Cognitive  Recommendation:  24 hour supervision/assistance;Outpatient SLP  Rationale for SLP Follow Up: Maximize cognitive function and independence;Reduce caregiver burden   Equipment: none   Reasons for discharge: Discharged from hospital with caregivers able to provide necessary level of assist.  Patient/Family Agrees with Progress Made and Goals Achieved: Yes   See FIM for current functional status  Charlane Ferretti., CCC-SLP [phone]  Aoi Kouns 07/16/2011, 4:26 PM

## 2011-07-16 NOTE — Progress Notes (Signed)
Social Work Discharge Note Discharge Note  The overall goal for the admission was met for:   Discharge location: Yes-HOME WITH FAMILY PROVIDING 24 HOUR CARE  Length of Stay: Yes-11 DAYS  Discharge activity level: Yes-SUPERVISION/MIN LEVEL  Home/community participation: Yes  Services provided included: MD, RD, PT, OT, SLP, RN, CM, TR, Pharmacy and SW  Financial Services: Medicare and Private Insurance: [place] [employer]  Follow-up services arranged: Outpatient: [facility]-PT,OT,SPT 6/25 7;45-9;45 and DME: [facility], [facility]  Comments (or additional information):FAMILY EDUCATION COMPLETED 6/17 READY FOR DISCHARGE PCP ARRANGED-PADONDA CAMPBELL-NP 6/19 11:15  Patient/Family verbalized understanding of follow-up arrangements: Yes  Individual responsible for coordination of the follow-up plan: CHILDREN  Confirmed correct DME delivered: Lucy Chris 07/16/2011    Pennie Vanblarcom, Lemar Livings

## 2011-07-16 NOTE — Progress Notes (Signed)
Occupational Therapy Session Note  Patient Details  Name: Jacqueline King MRN: [id number] Date of Birth: July 18, 1945  Today's Date: 07/16/2011 Time: 1000-1030 Time Calculation (min): 30 min  Pt seen for [employer] with [employer] to [employer] with focus on functional mobility, energy conservation strategies, and safety with mobility and obtaining/carrying items. See Community Outing Goal Sheet in [place] Chart for additional information.   Leonette Monarch 07/16/2011, 1:23 PM

## 2011-07-16 NOTE — Progress Notes (Signed)
Patient ID: Jacqueline King, female   DOB: November 29, 1945, 66 y.o.   MRN: [id number] Subjective/Complaints: No new complaints.  Has to find primary MD Review of Systems  No chest pain, SOB or abdominal pain. All other systems reviewed and are negative.   Objective: Vital Signs: Blood pressure 119/79, pulse 61, temperature 98.2 F (36.8 C), temperature source Oral, resp. rate 17, height 5.1" (0.13 m), weight 62 kg (136 lb 11 oz), SpO2 98.00%.  Last 5 Results:  Lab 07/10/11 0645  WBC --  HGB --  HCT --  PLT --  NA 136  K 3.5  CL 97  CO2 27  GLUCOSE 99  BUN 17  CREATININE 0.88  CALCIUM 9.4    Physical Exam:  General appearance: alert, cooperative and no distress Resp: clear to auscultation bilaterally Cardio: regular rate and rhythm, S1, S2 normal, no murmur, click, rub or gallop GI: soft, non-tender; bowel sounds normal; no masses,  no organomegaly Extremities: extremities normal, atraumatic, no cyanosis or edema Skin: Skin color, texture, turgor normal. No rashes or lesions Neurologic: Grossly normal. Speech with dysarthria.  Ataxia with LUE finger to nose. Cognitively displays good insight, awareness, and memory Cannot maintain single leg stance with R leg up holding on to walker. No changes Assessment/Plan: 1. Functional deficits secondary to R superior cerebellar vermis infarct stable for discharge        PMR f/u , PCP f/u  Neuro f/u          FIM: FIM - Bathing Bathing Steps Patient Completed: Chest;Right Arm;Left Arm;Abdomen;Front perineal area;Buttocks;Right upper leg;Left upper leg;Right lower leg (including foot);Left lower leg (including foot) Bathing: 5: Supervision: Safety issues/verbal cues  FIM - Upper Body Dressing/Undressing Upper body dressing/undressing steps patient completed: Thread/unthread right bra strap;Thread/unthread left bra strap;Hook/unhook bra;Thread/unthread right sleeve of pullover shirt/dresss Upper body dressing/undressing: 6: More than  reasonable amount of time FIM - Lower Body Dressing/Undressing Lower body dressing/undressing steps patient completed: Thread/unthread right underwear leg;Thread/unthread left underwear leg;Pull underwear up/down;Thread/unthread left pants leg;Pull pants up/down;Thread/unthread right pants leg;Fasten/unfasten pants;Don/Doff right sock;Don/Doff left sock;Don/Doff right shoe;Don/Doff left shoe;Fasten/unfasten right shoe;Fasten/unfasten left shoe Lower body dressing/undressing: 6: More than reasonable amount of time  FIM - Toileting Toileting steps completed by patient: Adjust clothing prior to toileting;Performs perineal hygiene;Adjust clothing after toileting Toileting Assistive Devices: Grab bar or rail for support Toileting: 6: More than reasonable amount of time  FIM - Diplomatic Services operational officer Devices: Art gallery manager Transfers: 5-To toilet/BSC: Supervision (verbal cues/safety issues);5-From toilet/BSC: Supervision (verbal cues/safety issues)  FIM - Banker Devices: Therapist, occupational: 7: Supine > Sit: No assist;7: Sit > Supine: No assist  FIM - Locomotion: Wheelchair Locomotion: Wheelchair: 0: Activity did not occur FIM - Locomotion: Ambulation Locomotion: Ambulation Assistive Devices: Designer, industrial/product Ambulation/Gait Assistance: 5: Supervision Locomotion: Ambulation: 4: Travels 150 ft or more with minimal assistance (Pt.>75%)  Comprehension Comprehension Mode: Auditory Comprehension: 5-Follows basic conversation/direction: With extra time/assistive device  Expression Expression Mode: Verbal Expression: 6-Expresses complex ideas: With extra time/assistive device  Social Interaction Social Interaction: 6-Interacts appropriately with others with medication or extra time (anti-anxiety, antidepressant).  Problem Solving Problem Solving: 5-Solves basic 90% of the time/requires cueing < 10% of the  time  Memory Memory: 5-Recognizes or recalls 90% of the time/requires cueing < 10% of the time   Medical Problem List and Plan:  1. DVT Prophylaxis/Anticoagulation: Mechanical: Sequential compression devices, below knee Bilateral lower extremities  2. Pain Management: ultram prn for pain.  3.  Mood: Monitor for now. Mood upbeat 4. HTN: Continue qid checks for now -. On norvasc, chlorthalidone, lisinopril and metoprolol with improving control.  Monitor for hypotension as meds get to steady state.   5. Dyslipidemia: Continue Lipitor.  6. Hypothyroid: On supplement.  7. Hypokalemia: d/t diuretics.  Daily potassium supplement added 8. Heme positive stool 1/3: hemoglobin stable outpt f/u GI  11  Cerrone Debold E 07/16/2011, 8:39 AM

## 2011-07-17 ENCOUNTER — Encounter: Payer: Self-pay | Admitting: Family

## 2011-07-17 ENCOUNTER — Ambulatory Visit (INDEPENDENT_AMBULATORY_CARE_PROVIDER_SITE_OTHER): Payer: BC Managed Care – PPO | Admitting: Family

## 2011-07-17 VITALS — BP 140/96 | Temp 98.5°F | Wt 130.5 lb

## 2011-07-17 DIAGNOSIS — I619 Nontraumatic intracerebral hemorrhage, unspecified: Secondary | ICD-10-CM

## 2011-07-17 DIAGNOSIS — R7309 Other abnormal glucose: Secondary | ICD-10-CM

## 2011-07-17 DIAGNOSIS — R739 Hyperglycemia, unspecified: Secondary | ICD-10-CM

## 2011-07-17 DIAGNOSIS — I10 Essential (primary) hypertension: Secondary | ICD-10-CM

## 2011-07-17 DIAGNOSIS — E785 Hyperlipidemia, unspecified: Secondary | ICD-10-CM

## 2011-07-17 NOTE — Progress Notes (Signed)
Subjective:    Patient ID: Jacqueline King, female    DOB: 1945-04-29, 66 y.o.   MRN: [id number]  HPI 66 year old AAF is in today as a follow-up from the hospital after presenting with weakness. 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  She was also found to have hypercholesterolemia and hyperglycemia. She has minimal residual weakness but continues to have slurred speech that is improving. She is ambulating very well with a walker. She has a follow-up with Neurology in August and continues to go to stroke rehab. She has a history of hypothyroidism post thyroidectomy and has not been on medication for many years. She has not seen a PCP since 2007. No health maintenance has been done in the last 6 years.   Review of Systems  Constitutional: Negative.   HENT: Negative.   Eyes: Negative.   Respiratory: Negative.   Cardiovascular: Negative.   Gastrointestinal: Negative.   Genitourinary: Negative.   Musculoskeletal: Negative.   Neurological: Negative.   Hematological: Negative.   Psychiatric/Behavioral: Negative.    Past Medical History  Diagnosis Date  . Hypertension   . H/O radioactive iodine thyroid ablation   . Stroke     History   Social History  . Marital Status: Divorced    Spouse Name: N/A    Number of Children: N/A  . Years of Education: N/A   Occupational History  . Not on file.   Social History Main Topics  . Smoking status: Former Games developer  . Smokeless tobacco: Not on file  . Alcohol Use: No  . Drug Use: No  . Sexually Active: Not on file   Other Topics Concern  . Not on file   Social History Narrative  . No narrative on file    Past Surgical History  Procedure Date  . Abdominal hysterectomy   . Cholecystectomy   . Renal artery angioplasty 1993 or 1994    Family History  Problem Relation Age of Onset  . Diabetes Mother   . Diabetes Sister   . Diabetes Brother     Allergies  Allergen Reactions  . Sulfa Drugs  Cross Reactors Hives    Current Outpatient Prescriptions on File Prior to Visit  Medication Sig Dispense Refill  . amLODipine (NORVASC) 5 MG tablet Take 1 tablet (5 mg total) by mouth daily.  30 tablet  1  . atorvastatin (LIPITOR) 40 MG tablet Take 1 tablet (40 mg total) by mouth daily at 6 PM.  30 tablet  1  . chlorthalidone (HYGROTON) 25 MG tablet Take 1 tablet (25 mg total) by mouth daily.  30 tablet  1  . ferrous sulfate 325 (65 FE) MG tablet Take 1 tablet (325 mg total) by mouth 2 (two) times daily with a meal.  60 tablet  1  . levothyroxine (SYNTHROID, LEVOTHROID) 25 MCG tablet Take 1 tablet (25 mcg total) by mouth daily before breakfast.  30 tablet  1  . lisinopril (PRINIVIL,ZESTRIL) 20 MG tablet Take 1 tablet (20 mg total) by mouth 2 (two) times daily.  60 tablet  1  . metoprolol succinate (TOPROL-XL) 25 MG 24 hr tablet Take 1 tablet (25 mg total) by mouth daily.  30 tablet  1  . pantoprazole (PROTONIX) 40 MG tablet Take 1 tablet (40 mg total) by mouth at bedtime.  30 tablet  1  . potassium chloride SA (K-DUR,KLOR-CON) 20 MEQ tablet Take 1 tablet (20 mEq total) by mouth daily.  30 tablet  1  . traMADol (ULTRAM) 50 MG tablet Take 1-2 tablets (50-100 mg total) by mouth every 6 (six) hours as needed.  60 tablet  0   Current Facility-Administered Medications on File Prior to Visit  Medication Dose Route Frequency Provider Last Rate Last Dose  . DISCONTD: acetaminophen (TYLENOL) tablet 325-650 mg  325-650 mg Oral Q4H PRN Jacquelynn Cree, PA      . DISCONTD: alum & mag hydroxide-simeth (MAALOX/MYLANTA) 200-200-20 MG/5ML suspension 30 mL  30 mL Oral Q4H PRN Jacquelynn Cree, PA      . DISCONTD: amLODipine (NORVASC) tablet 5 mg  5 mg Oral Daily Evlyn Kanner Love, PA   5 mg at 07/16/11 0814  . DISCONTD: atorvastatin (LIPITOR) tablet 40 mg  40 mg Oral q1800 Evlyn Kanner Love, PA   40 mg at 07/15/11 1727  . DISCONTD: bisacodyl (DULCOLAX) suppository 10 mg  10 mg Rectal Daily PRN Jacquelynn Cree, PA      .  DISCONTD: chlorthalidone (HYGROTON) tablet 25 mg  25 mg Oral Daily Evlyn Kanner Love, PA   25 mg at 07/16/11 0814  . DISCONTD: cloNIDine (CATAPRES) tablet 0.1 mg  0.1 mg Oral Q6H PRN Evlyn Kanner Love, PA   0.1 mg at 07/05/11 1756  . DISCONTD: ferrous sulfate tablet 325 mg  325 mg Oral BID WC Elyse Jarvis, MD   325 mg at 07/16/11 0814  . DISCONTD: guaiFENesin-dextromethorphan (ROBITUSSIN DM) 100-10 MG/5ML syrup 5-10 mL  5-10 mL Oral Q6H PRN Jacquelynn Cree, PA      . DISCONTD: levothyroxine (SYNTHROID, LEVOTHROID) tablet 25 mcg  25 mcg Oral QAC breakfast Jacquelynn Cree, PA   25 mcg at 07/16/11 0814  . DISCONTD: lisinopril (PRINIVIL,ZESTRIL) tablet 20 mg  20 mg Oral BID Jacquelynn Cree, PA   20 mg at 07/15/11 2223  . DISCONTD: metoprolol succinate (TOPROL-XL) 24 hr tablet 25 mg  25 mg Oral Daily Evlyn Kanner Love, PA   25 mg at 07/16/11 0814  . DISCONTD: pantoprazole (PROTONIX) EC tablet 40 mg  40 mg Oral QHS Evlyn Kanner Love, PA   40 mg at 07/15/11 2223  . DISCONTD: polycarbophil (FIBERCON) tablet 1,250 mg  1,250 mg Oral Daily Jacquelynn Cree, PA   1,250 mg at 07/16/11 0814  . DISCONTD: potassium chloride SA (K-DUR,KLOR-CON) CR tablet 20 mEq  20 mEq Oral Daily Ranelle Oyster, MD   20 mEq at 07/16/11 0814  . DISCONTD: prochlorperazine (COMPAZINE) injection 5-10 mg  5-10 mg Intramuscular Q6H PRN Jacquelynn Cree, PA      . DISCONTD: prochlorperazine (COMPAZINE) suppository 12.5 mg  12.5 mg Rectal Q6H PRN Jacquelynn Cree, PA      . DISCONTD: prochlorperazine (COMPAZINE) tablet 5-10 mg  5-10 mg Oral Q6H PRN Jacquelynn Cree, PA      . DISCONTD: traMADol Janean Sark) tablet 50-100 mg  50-100 mg Oral Q6H PRN Jacquelynn Cree, PA      . DISCONTD: traZODone (DESYREL) tablet 25-50 mg  25-50 mg Oral QHS PRN Evlyn Kanner Love, PA        BP 140/96  Temp 98.5 F (36.9 C) (Oral)  Wt 130 lb 8 oz (59.194 kg)chart    Objective:   Physical Exam  Constitutional: She is oriented to person, place, and time. She appears well-developed and  well-nourished.  HENT:  Right Ear: External ear normal.  Left Ear: External ear normal.  Nose: Nose normal.  Mouth/Throat: Oropharynx is clear and moist.  Eyes: Conjunctivae  and EOM are normal. Pupils are equal, round, and reactive to light.  Neck: Normal range of motion. Neck supple.  Cardiovascular: Normal rate, regular rhythm, normal heart sounds and intact distal pulses.  Exam reveals no gallop and no friction rub.   No murmur heard. Pulmonary/Chest: Effort normal and breath sounds normal.  Abdominal: Soft. Bowel sounds are normal.  Musculoskeletal: Normal range of motion.  Neurological: She is alert and oriented to person, place, and time. She has normal strength and normal reflexes. She displays no atrophy and no tremor. She exhibits normal muscle tone. Coordination normal.  Skin: Skin is warm and dry.  Psychiatric: She has a normal mood and affect.          Assessment & Plan:  Assessment: Hypertension, Hyperglycemia, Hypercholesterolemia, Cerebral Hemorrhage  Plan: Recheck for CPX in 3-4 weeks. Continue current medications. Heart healthy diet with low sodium. See specialist as scheduled. Call the office with any questions or concerns. Recheck as discussed.

## 2011-07-17 NOTE — Patient Instructions (Addendum)
Cholesterol Control Diet Cholesterol levels in your body are determined significantly by your diet. Cholesterol levels may also be related to heart disease. The following material helps to explain this relationship and discusses what you can do to help keep your heart healthy. Not all cholesterol is bad. Low-density lipoprotein (LDL) cholesterol is the "bad" cholesterol. It may cause fatty deposits to build up inside your arteries. High-density lipoprotein (HDL) cholesterol is "good." It helps to remove the "bad" LDL cholesterol from your blood. Cholesterol is a very important risk factor for heart disease. Other risk factors are high blood pressure, smoking, stress, heredity, and weight. The heart muscle gets its supply of blood through the coronary arteries. If your LDL cholesterol is high and your HDL cholesterol is low, you are at risk for having fatty deposits build up in your coronary arteries. This leaves less room through which blood can flow. Without sufficient blood and oxygen, the heart muscle cannot function properly and you may feel chest pains (angina pectoris). When a coronary artery closes up entirely, a part of the heart muscle may die, causing a heart attack (myocardial infarction). CHECKING CHOLESTEROL When your caregiver sends your blood to a lab to be analyzed for cholesterol, a complete lipid (fat) profile may be done. With this test, the total amount of cholesterol and levels of LDL and HDL are determined. Triglycerides are a type of fat that circulates in the blood and can also be used to determine heart disease risk. The list below describes what the numbers should be: Test: Total Cholesterol.  Less than 200 mg/dl.  Test: LDL "bad cholesterol."  Less than 100 mg/dl.   Less than 70 mg/dl if you are at very high risk of a heart attack or sudden cardiac death.  Test: HDL "good cholesterol."  Greater than 50 mg/dl for women.   Greater than 40 mg/dl for men.  Test:  Triglycerides.  Less than 150 mg/dl.  CONTROLLING CHOLESTEROL WITH DIET Although exercise and lifestyle factors are important, your diet is key. That is because certain foods are known to raise cholesterol and others to lower it. The goal is to balance foods for their effect on cholesterol and more importantly, to replace saturated and trans fat with other types of fat, such as monounsaturated fat, polyunsaturated fat, and omega-3 fatty acids. On average, a person should consume no more than 15 to 17 g of saturated fat daily. Saturated and trans fats are considered "bad" fats, and they will raise LDL cholesterol. Saturated fats are primarily found in animal products such as meats, butter, and cream. However, that does not mean you need to sacrifice all your favorite foods. Today, there are good tasting, low-fat, low-cholesterol substitutes for most of the things you like to eat. Choose low-fat or nonfat alternatives. Choose round or loin cuts of red meat, since these types of cuts are lowest in fat and cholesterol. Chicken (without the skin), fish, veal, and ground turkey breast are excellent choices. Eliminate fatty meats, such as hot dogs and salami. Even shellfish have little or no saturated fat. Have a 3 oz (85 g) portion when you eat lean meat, poultry, or fish. Trans fats are also called "partially hydrogenated oils." They are oils that have been scientifically manipulated so that they are solid at room temperature resulting in a longer shelf life and improved taste and texture of foods in which they are added. Trans fats are found in stick margarine, some tub margarines, cookies, crackers, and baked goods.  When   baking and cooking, oils are an excellent substitute for butter. The monounsaturated oils are especially beneficial since it is believed they lower LDL and raise HDL. The oils you should avoid entirely are saturated tropical oils, such as coconut and palm.  Remember to eat liberally from food  groups that are naturally free of saturated and trans fat, including fish, fruit, vegetables, beans, grains (barley, rice, couscous, bulgur wheat), and pasta (without cream sauces).  IDENTIFYING FOODS THAT LOWER CHOLESTEROL  Soluble fiber may lower your cholesterol. This type of fiber is found in fruits such as apples, vegetables such as broccoli, potatoes, and carrots, legumes such as beans, peas, and lentils, and grains such as barley. Foods fortified with plant sterols (phytosterol) may also lower cholesterol. You should eat at least 2 g per day of these foods for a cholesterol lowering effect.  Read package labels to identify low-saturated fats, trans fats free, and low-fat foods at the supermarket. Select cheeses that have only 2 to 3 g saturated fat per ounce. Use a heart-healthy tub margarine that is free of trans fats or partially hydrogenated oil. When buying baked goods (cookies, crackers), avoid partially hydrogenated oils. Breads and muffins should be made from whole grains (whole-wheat or whole oat flour, instead of "flour" or "enriched flour"). Buy non-creamy canned soups with reduced salt and no added fats.  FOOD PREPARATION TECHNIQUES  Never deep-fry. If you must fry, either stir-fry, which uses very little fat, or use non-stick cooking sprays. When possible, broil, bake, or roast meats, and steam vegetables. Instead of dressing vegetables with butter or margarine, use lemon and herbs, applesauce and cinnamon (for squash and sweet potatoes), nonfat yogurt, salsa, and low-fat dressings for salads.  LOW-SATURATED FAT / LOW-FAT FOOD SUBSTITUTES Meats / Saturated Fat (g)  Avoid: Steak, marbled (3 oz/85 g) / 11 g   Choose: Steak, lean (3 oz/85 g) / 4 g   Avoid: Hamburger (3 oz/85 g) / 7 g   Choose: Hamburger, lean (3 oz/85 g) / 5 g   Avoid: Ham (3 oz/85 g) / 6 g   Choose: Ham, lean cut (3 oz/85 g) / 2.4 g   Avoid: Chicken, with skin, dark meat (3 oz/85 g) / 4 g   Choose: Chicken,  skin removed, dark meat (3 oz/85 g) / 2 g   Avoid: Chicken, with skin, light meat (3 oz/85 g) / 2.5 g   Choose: Chicken, skin removed, light meat (3 oz/85 g) / 1 g  Dairy / Saturated Fat (g)  Avoid: Whole milk (1 cup) / 5 g   Choose: Low-fat milk, 2% (1 cup) / 3 g   Choose: Low-fat milk, 1% (1 cup) / 1.5 g   Choose: Skim milk (1 cup) / 0.3 g   Avoid: Hard cheese (1 oz/28 g) / 6 g   Choose: Skim milk cheese (1 oz/28 g) / 2 to 3 g   Avoid: Cottage cheese, 4% fat (1 cup) / 6.5 g   Choose: Low-fat cottage cheese, 1% fat (1 cup) / 1.5 g   Avoid: Ice cream (1 cup) / 9 g   Choose: Sherbet (1 cup) / 2.5 g   Choose: Nonfat frozen yogurt (1 cup) / 0.3 g   Choose: Frozen fruit bar / trace   Avoid: Whipped cream (1 tbs) / 3.5 g   Choose: Nondairy whipped topping (1 tbs) / 1 g  Condiments / Saturated Fat (g)  Avoid: Mayonnaise (1 tbs) / 2 g   Choose: Low-fat mayonnaise (  1 tbs) / 1 g   Avoid: Butter (1 tbs) / 7 g   Choose: Extra light margarine (1 tbs) / 1 g   Avoid: Coconut oil (1 tbs) / 11.8 g   Choose: Olive oil (1 tbs) / 1.8 g   Choose: Corn oil (1 tbs) / 1.7 g   Choose: Safflower oil (1 tbs) / 1.2 g   Choose: Sunflower oil (1 tbs) / 1.4 g   Choose: Soybean oil (1 tbs) / 2.4 g   Choose: Canola oil (1 tbs) / 1 g  Document Released: 01/14/2005 Document Revised: 01/03/2011 Document Reviewed: 07/05/2010 [facility] Patient Information 2012 [place], [place].  Hypercholesterolemia High Blood Cholesterol Cholesterol is a white, waxy, fat-like protein needed by your body in small amounts. The liver makes all the cholesterol you need. It is carried from the liver by the blood through the blood vessels. Deposits (plaque) may build up on blood vessel walls. This makes the arteries narrower and stiffer. Plaque increases the risk for heart attack and stroke. You cannot feel your cholesterol level even if it is very high. The only way to know is by a blood test to check your lipid  (fats) levels. Once you know your cholesterol levels, you should keep a record of the test results. Work with your caregiver to to keep your levels in the desired range. WHAT THE RESULTS MEAN:  Total cholesterol is a rough measure of all the cholesterol in your blood.   LDL is the so-called bad cholesterol. This is the type that deposits cholesterol in the walls of the arteries. You want this level to be low.   HDL is the good cholesterol because it cleans the arteries and carries the LDL away. You want this level to be high.   Triglycerides are fat that the body can either burn for energy or store. High levels are closely linked to heart disease.  DESIRED LEVELS:  Total cholesterol below 200.   LDL below 100 for people at risk, below 70 for very high risk.   HDL above 50 is good, above 60 is best.   Triglycerides below 150.  HOW TO LOWER YOUR CHOLESTEROL:  Diet.   Choose fish or white meat chicken and [place], roasted or baked. Limit fatty cuts of red meat, fried foods, and processed meats, such as sausage and lunch meat.   Eat lots of fresh fruits and vegetables. Choose whole grains, beans, pasta, potatoes and cereals.   Use only small amounts of olive, corn or canola oils. Avoid butter, mayonnaise, shortening or palm kernel oils. Avoid foods with trans-fats.   Use skim/nonfat milk and low-fat/nonfat yogurt and cheeses. Avoid whole milk, cream, ice cream, egg yolks and cheeses. Healthy desserts include angel food cake, gingersnaps, animal crackers, hard candy, popsicles, and low-fat/nonfat frozen yogurt. Avoid pastries, cakes, pies and cookies.   Exercise.   A regular program helps decrease LDL and raises HDL.   Helps with weight control.   Do things that increase your activity level like gardening, walking, or taking the stairs.   Medication.   May be prescribed by your caregiver to help lowering cholesterol and the risk for heart disease.   You may need medicine even if  your levels are normal if you have several risk factors.  HOME CARE INSTRUCTIONS   Follow your diet and exercise programs as suggested by your caregiver.   Take medications as directed.   Have blood work done when your caregiver feels it is necessary.  MAKE  SURE YOU:   Understand these instructions.   Will watch your condition.   Will get help right away if you are not doing well or get worse.  Document Released: 01/14/2005 Document Revised: 01/03/2011 Document Reviewed: 07/02/2006 [facility] Patient Information 2012 [place], [place].  2 Gram Low Sodium Diet A 2 gram sodium diet restricts the amount of sodium in the diet to no more than 2 g or 2000 mg daily. Limiting the amount of sodium is often used to help lower blood pressure. It is important if you have heart, liver, or kidney problems. Many foods contain sodium for flavor and sometimes as a preservative. When the amount of sodium in a diet needs to be low, it is important to know what to look for when choosing foods and drinks. The following includes some information and guidelines to help make it easier for you to adapt to a low sodium diet. QUICK TIPS  Do not add salt to food.   Avoid convenience items and fast food.   Choose unsalted snack foods.   Buy lower sodium products, often labeled as "lower sodium" or "no salt added."   Check food labels to learn how much sodium is in 1 serving.   When eating at a restaurant, ask that your food be prepared with less salt or none, if possible.  READING FOOD LABELS FOR SODIUM INFORMATION The nutrition facts label is a good place to find how much sodium is in foods. Look for products with no more than 500 to 600 mg of sodium per meal and no more than 150 mg per serving. Remember that 2 g = 2000 mg. The food label may also list foods as:  Sodium-free: Less than 5 mg in a serving.   Very low sodium: 35 mg or less in a serving.   Low-sodium: 140 mg or less in a serving.   Light in  sodium: 50% less sodium in a serving. For example, if a food that usually has 300 mg of sodium is changed to become light in sodium, it will have 150 mg of sodium.   Reduced sodium: 25% less sodium in a serving. For example, if a food that usually has 400 mg of sodium is changed to reduced sodium, it will have 300 mg of sodium.  CHOOSING FOODS Grains  Avoid: Salted crackers and snack items. Some cereals, including instant hot cereals. Bread stuffing and biscuit mixes. Seasoned rice or pasta mixes.   Choose: Unsalted snack items. Low-sodium cereals, oats, puffed wheat and rice, shredded wheat. English muffins and bread. Pasta.  Meats  Avoid: Salted, canned, smoked, spiced, pickled meats, including fish and poultry. Bacon, ham, sausage, cold cuts, hot dogs, anchovies.   Choose: Low-sodium canned tuna and salmon. Fresh or frozen meat, poultry, and fish.  Dairy  Avoid: Processed cheese and spreads. Cottage cheese. Buttermilk and condensed milk. Regular cheese.   Choose: Milk. Low-sodium cottage cheese. Yogurt. Sour cream. Low-sodium cheese.  Fruits and Vegetables  Avoid: Regular canned vegetables. Regular canned tomato sauce and paste. Frozen vegetables in sauces. Olives. Rosita Fire. Relishes. Sauerkraut.   Choose: Low-sodium canned vegetables. Low-sodium tomato sauce and paste. Frozen or fresh vegetables. Fresh and frozen fruit.  Condiments  Avoid: Canned and packaged gravies. Worcestershire sauce. Tartar sauce. Barbecue sauce. Soy sauce. Steak sauce. Ketchup. Onion, garlic, and table salt. Meat flavorings and tenderizers.   Choose: Fresh and dried herbs and spices. Low-sodium varieties of mustard and ketchup. Lemon juice. Tabasco sauce. Horseradish.  SAMPLE 2 GRAM SODIUM MEAL  PLAN Breakfast / Sodium (mg)  1 cup low-fat milk / 143 mg   2 slices whole-wheat toast / 270 mg   1 tbs heart-healthy margarine / 153 mg   1 hard-boiled egg / 139 mg   1 small orange / 0 mg  Lunch / Sodium  (mg)  1 cup raw carrots / 76 mg    cup hummus / 298 mg   1 cup low-fat milk / 143 mg    cup red grapes / 2 mg   1 whole-wheat pita bread / 356 mg  Dinner / Sodium (mg)  1 cup whole-wheat pasta / 2 mg   1 cup low-sodium tomato sauce / 73 mg   3 oz lean ground beef / 57 mg   1 small side salad (1 cup raw spinach leaves,  cup cucumber,  cup yellow bell pepper) with 1 tsp olive oil and 1 tsp red wine vinegar / 25 mg  Snack / Sodium (mg)  1 container low-fat vanilla yogurt / 107 mg   3 graham cracker squares / 127 mg  Nutrient Analysis  Calories: 2033   Protein: 77 g   Carbohydrate: 282 g   Fat: 72 g   Sodium: 1971 mg  Document Released: 01/14/2005 Document Revised: 01/03/2011 Document Reviewed: 04/17/2009 [facility] Patient Information 2012 [place], [place].

## 2011-07-23 ENCOUNTER — Ambulatory Visit: Payer: BC Managed Care – PPO | Admitting: Physical Therapy

## 2011-07-23 ENCOUNTER — Ambulatory Visit: Payer: BC Managed Care – PPO | Attending: Physical Medicine & Rehabilitation | Admitting: Occupational Therapy

## 2011-07-23 DIAGNOSIS — R269 Unspecified abnormalities of gait and mobility: Secondary | ICD-10-CM | POA: Insufficient documentation

## 2011-07-23 DIAGNOSIS — I69998 Other sequelae following unspecified cerebrovascular disease: Secondary | ICD-10-CM | POA: Insufficient documentation

## 2011-07-23 DIAGNOSIS — I69919 Unspecified symptoms and signs involving cognitive functions following unspecified cerebrovascular disease: Secondary | ICD-10-CM | POA: Insufficient documentation

## 2011-07-23 DIAGNOSIS — M6281 Muscle weakness (generalized): Secondary | ICD-10-CM | POA: Insufficient documentation

## 2011-07-23 DIAGNOSIS — Z5189 Encounter for other specified aftercare: Secondary | ICD-10-CM | POA: Insufficient documentation

## 2011-07-23 DIAGNOSIS — R279 Unspecified lack of coordination: Secondary | ICD-10-CM | POA: Insufficient documentation

## 2011-07-26 NOTE — Discharge Summary (Signed)
Jacqueline King, Jacqueline King                ACCOUNT NO.:  [id number]  MEDICAL RECORD NO.:  [id number]  LOCATION:  4038                         FACILITY:  [facility]  PHYSICIAN:  Erick Colace, M.D.DATE OF BIRTH:  [birth year]  DATE OF ADMISSION:  07/05/2011 DATE OF DISCHARGE:  07/16/2011                              DISCHARGE SUMMARY   DISCHARGE DIAGNOSES: 1. 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 2. Hypertension. 3. Dyslipidemia. 4. Hypothyroidism. 5. Hypokalemia. 6. Episode of heme-positive stool.  HISTORY OF PRESENT ILLNESS:  [name] is a 66 year old right handed female with history of uncontrolled hypertension and hypothyroidism.  No medication times years, admitted on July 01, 2011 with dizziness and nausea.  The patient was noted to have malignant hypertension with blood pressure 200/100 as well as nystagmus, ataxia and nausea.  MRI of brain done revealed 9 x 11-mm acute hemorrhage in right superior vermis with intraventricular extension.  Small foci of acute infarct in right medial thalamus and superior peduncle, and multiple foci of chronic infarcts and chronic hemorrhage bilaterally.  A 2D echo done, revealed mild LVH with EF of 55-65%.  Carotid Doppler showed no ICA stenosis.  The patient was evaluated by Neurology who recommended reducing stroke risk factors with control of dyslipidemia, optimum blood pressure control.  A CT of abdomen was done for workup of uncontrolled hypertension and revealed vessel irregularity of the mid and distal right renal artery with ectasia SMA, small-to-moderate-sized pericardial fluid, and diffuse wall thickening of the rectal and sigmoid colon suspicious for colitis.  The patient was followed by IMTS for medical issues.  They indicated that the patient has renal artery angioplasty in the past and focus now would be medical management  for treatment of hypertension.  The patient was evaluated by Therapy team and we felt that she would benefit from a rehab program.  PAST MEDICAL HISTORY:  Positive for hypertension, hypothyroidism after radioactive iodine thyroid ablation and medical noncompliance.  SOCIAL HISTORY:  The patient was independent and working as an Environmental health practitioner prior to admission.  She had plans to retire in September 2013. Was active PTA.  FUNCTIONAL STATUS:  The patient was independent for bed mobility, supervision for sit-to-stand transfers, +2 total-assist 60% for ambulating 90 feet with handheld-assist and heavy list to the left. Also noted to have scissoring gait and stumbling while trying to recover balance.  She required min-assist for lower body dressing, +2 total- assist for toileting transfers.  HOSPITAL COURSE:  [name] was admitted to rehab on July 05, 2011, for inpatient therapies to consist of PT, OT, and speech therapy at least 3 hours 5 days a week.  Past-admission, physiatrist, rehab RN, and therapy team have worked together to provide customized collaborative interdisciplinary care.  The patient's blood pressures were checked on b.i.d. basis and these were noted to be reasonably controlled.  Her H and H was monitored along as well as stool guaiacs were ordered due to episode of heme-positive stools on acute and question of colitis on CT of abdomen. Stool guaiacs checked were negative X 2. Her H and H  was followed along and was noted to be improving.  Labs of July 08, 2011, revealed a hemoglobin of 11.0, hematocrit 30.6, white count 4.8, platelets 171.  Check of lytes past-admission revealed  sodium 136, potassium 3.5, chloride 98, CO2 26, BUN 19, creatinine 0.89,  glucose 94. LFTs done revealed alkaline phosphatase 47, albumin 3.6, AST 40, ALT 41, total protein 7.1.  The patient denied any GI symptoms. No abdominal pain, diarrhea or rectal bleeding reported.   Recommend follow up GI workup on outpatient basis as indicated.  The patient's p.o. intake has been good.  She has been continent of bowel and bladder.  During patient's stay in rehab, weekly team conferences were done to evaluate the patient's progress, set goals as well as discuss barriers to discharge.  Physical Therapy has worked with the patient on gait, balance strengthening as well as cognition.  Speech Therapy has worked with the patient on utilization of compensatory strategies for improving verbal expression as well as self-monitoring and correcting errors. They have also worked with the patient on problem solving for moderately complex functional task as well as utilizing memory compensatory aids to recall daily information.  At the time of discharge, the patient is able to express herself at 98% intelligibility during conversation.  The patient continues to demonstrate decreased ability to problem solve and self monitor and as a result, she is at supervision to minutes-assist level for moderately complex problem solving and for utilization of memory compensatory and external aids.  She has demonstrated functional gains and expressive communication and has progressed from min-assist to occasional needs subtle cues for expression.  OT has worked with the patient on self-care task.  The patient is able to complete toileting and dressing at modified independent level without any verbal cues.  She does require supervision for bathing in a shower and supervision for tub shower transfers.  She requires supervision for all meal preparation tasks and functional mobility in the kitchen with and without rolling walker due to cognitive delays as well as decreased dynamic standing balance.  The patient is currently at supervision level for transfers and mobility.  Her Berg balance score has improved from 24/56 to 34/56. She does continue decreased intellectual and emergent awareness  regarding balance reactions as well as difficulty with problem solving and alternating attention.  Therefore, 24-hour supervision is recommended for safety.  Family education was done with daughter to include all aspects of self-care as well as assistance needed with high-level cognitive tasks and mobility.  Further follow up outpatient PT, OT,ST has been set up past-discharge.  On July 16, 2011, the patient is discharged to home.  DISCHARGE MEDICATIONS: 1. Amlodipine 5 mg a day. 2. Lipitor 40 mg q.p.m. 3. Hygroton 25 mg per day. 4. Ferrous sulfate 325 mg b.i.d. 5. Levothyroxine 25 mcg a day. 6. Lisinopril 20 mg a day. 7. Metoprolol XL 25 mg a day. 8. Protonix 40 mg a day. 9. K-Dur 20 mEq a day. 10.Tramadol 50 mg 1-2 p.o. q.6 hours p.r.n. pain.  DIET:  Regular diet.  ACTIVITY:  As tolerated with 24-hour supervision and assistance. Walk with assistance.  SPECIAL INSTRUCTIONS:  Outpatient PT, OT, speech therapy to start on July 23, 2011 at [facility].  FOLLOWUP:  The patient to follow up with Dr. Claudette Laws on August 19, 2011 at 11 a.m. for 11:30 appointment.  Follow up with Adline Mango, nurse practitioner on July 17, 2011, at 11:15 a.m. Repeat stool  guaiacs or GI workup as  indicated for episode of heme positive stool.     Delle Reining, P.A.   ______________________________ Erick Colace, M.D.    PL/MEDQ  D:  07/25/2011  T:  07/26/2011  Job:  [id number]  cc:   Pramod P. Pearlean Brownie, MD Acey Lav, MD [facility] FNP, Orvan Falconer

## 2011-07-29 ENCOUNTER — Ambulatory Visit: Payer: BC Managed Care – PPO | Attending: Physical Medicine & Rehabilitation | Admitting: Speech Pathology

## 2011-07-29 DIAGNOSIS — R279 Unspecified lack of coordination: Secondary | ICD-10-CM | POA: Insufficient documentation

## 2011-07-29 DIAGNOSIS — R269 Unspecified abnormalities of gait and mobility: Secondary | ICD-10-CM | POA: Insufficient documentation

## 2011-07-29 DIAGNOSIS — Z5189 Encounter for other specified aftercare: Secondary | ICD-10-CM | POA: Insufficient documentation

## 2011-07-29 DIAGNOSIS — I69998 Other sequelae following unspecified cerebrovascular disease: Secondary | ICD-10-CM | POA: Insufficient documentation

## 2011-07-29 DIAGNOSIS — M6281 Muscle weakness (generalized): Secondary | ICD-10-CM | POA: Insufficient documentation

## 2011-07-29 DIAGNOSIS — I69919 Unspecified symptoms and signs involving cognitive functions following unspecified cerebrovascular disease: Secondary | ICD-10-CM | POA: Insufficient documentation

## 2011-08-13 ENCOUNTER — Ambulatory Visit: Payer: BC Managed Care – PPO

## 2011-08-13 ENCOUNTER — Ambulatory Visit: Payer: BC Managed Care – PPO | Admitting: Physical Therapy

## 2011-08-13 ENCOUNTER — Ambulatory Visit: Payer: BC Managed Care – PPO | Admitting: Occupational Therapy

## 2011-08-14 ENCOUNTER — Ambulatory Visit (INDEPENDENT_AMBULATORY_CARE_PROVIDER_SITE_OTHER): Payer: BC Managed Care – PPO | Admitting: Family

## 2011-08-14 ENCOUNTER — Encounter: Payer: Self-pay | Admitting: Family

## 2011-08-14 VITALS — BP 180/120 | HR 68 | Temp 98.4°F | Ht 61.0 in | Wt 127.0 lb

## 2011-08-14 DIAGNOSIS — I10 Essential (primary) hypertension: Secondary | ICD-10-CM

## 2011-08-14 DIAGNOSIS — Z1231 Encounter for screening mammogram for malignant neoplasm of breast: Secondary | ICD-10-CM

## 2011-08-14 DIAGNOSIS — Z0000 Encounter for general adult medical examination without abnormal findings: Secondary | ICD-10-CM

## 2011-08-14 DIAGNOSIS — E039 Hypothyroidism, unspecified: Secondary | ICD-10-CM

## 2011-08-14 LAB — BASIC METABOLIC PANEL
CO2: 27 mEq/L (ref 19–32)
Chloride: 97 mEq/L (ref 96–112)
Creatinine, Ser: 0.7 mg/dL (ref 0.4–1.2)
Glucose, Bld: 106 mg/dL — ABNORMAL HIGH (ref 70–99)

## 2011-08-14 LAB — CBC WITH DIFFERENTIAL/PLATELET
Basophils Absolute: 0 10*3/uL (ref 0.0–0.1)
Eosinophils Relative: 1 % (ref 0.0–5.0)
Hemoglobin: 10.6 g/dL — ABNORMAL LOW (ref 12.0–15.0)
Lymphocytes Relative: 28.7 % (ref 12.0–46.0)
Monocytes Relative: 9.3 % (ref 3.0–12.0)
Platelets: 173 10*3/uL (ref 150.0–400.0)
RDW: 14.4 % (ref 11.5–14.6)
WBC: 5.4 10*3/uL (ref 4.5–10.5)

## 2011-08-14 LAB — LIPID PANEL
LDL Cholesterol: 57 mg/dL (ref 0–99)
Total CHOL/HDL Ratio: 2
Triglycerides: 47 mg/dL (ref 0.0–149.0)

## 2011-08-14 LAB — TSH: TSH: 24.41 u[IU]/mL — ABNORMAL HIGH (ref 0.35–5.50)

## 2011-08-14 MED ORDER — CHLORTHALIDONE 25 MG PO TABS: 25.0000 mg | ORAL_TABLET | Freq: Every day | ORAL | Status: DC

## 2011-08-14 MED ORDER — LISINOPRIL 20 MG PO TABS: 20.0000 mg | ORAL_TABLET | Freq: Two times a day (BID) | ORAL | Status: DC

## 2011-08-14 MED ORDER — ATORVASTATIN CALCIUM 40 MG PO TABS: 40.0000 mg | ORAL_TABLET | Freq: Every day | ORAL | Status: DC

## 2011-08-14 MED ORDER — AMLODIPINE BESYLATE 10 MG PO TABS: 10.0000 mg | ORAL_TABLET | Freq: Every day | ORAL | Status: DC

## 2011-08-14 MED ORDER — METOPROLOL SUCCINATE ER 25 MG PO TB24: 25.0000 mg | ORAL_TABLET | Freq: Every day | ORAL | Status: DC

## 2011-08-14 NOTE — Progress Notes (Signed)
Subjective:    Patient ID: Jacqueline King, female    DOB: 02-19-1945, 66 y.o.   MRN: [id number]  HPI  66 year old African American Female is in for routine physical examination for this healthy  Female. Reviewed all health maintenance protocols including mammography colonoscopy bone density and reviewed appropriate screening labs. Her immunization history was reviewed as well as her current medications and allergies refills of her chronic medications were given and the plan for yearly health maintenance was discussed all orders and referrals were made as appropriate.   Review of Systems  Constitutional: Negative.   HENT: Negative.   Respiratory: Negative.   Cardiovascular: Negative.   Gastrointestinal: Negative.   Musculoskeletal: Negative.   Hematological: Negative.   Psychiatric/Behavioral: Negative.    Past Medical History  Diagnosis Date  . Hypertension   . H/O radioactive iodine thyroid ablation   . Stroke     History   Social History  . Marital Status: Widowed    Spouse Name: N/A    Number of Children: N/A  . Years of Education: N/A   Occupational History  . Not on file.   Social History Main Topics  . Smoking status: Former Games developer  . Smokeless tobacco: Not on file  . Alcohol Use: No  . Drug Use: No  . Sexually Active: Not on file   Other Topics Concern  . Not on file   Social History Narrative  . No narrative on file    Past Surgical History  Procedure Date  . Abdominal hysterectomy   . Cholecystectomy   . Renal artery angioplasty 1993 or 1994    Family History  Problem Relation Age of Onset  . Diabetes Mother   . Diabetes Sister   . Diabetes Brother     Allergies  Allergen Reactions  . Sulfa Drugs Cross Reactors Hives    Current Outpatient Prescriptions on File Prior to Visit  Medication Sig Dispense Refill  . levothyroxine (SYNTHROID, LEVOTHROID) 25 MCG tablet Take 1 tablet (25 mcg total) by mouth daily before breakfast.  30 tablet  1  .  potassium chloride SA (K-DUR,KLOR-CON) 20 MEQ tablet Take 1 tablet (20 mEq total) by mouth daily.  30 tablet  1  . DISCONTD: amLODipine (NORVASC) 5 MG tablet Take 1 tablet (5 mg total) by mouth daily.  30 tablet  1  . DISCONTD: atorvastatin (LIPITOR) 40 MG tablet Take 1 tablet (40 mg total) by mouth daily at 6 PM.  30 tablet  1  . DISCONTD: chlorthalidone (HYGROTON) 25 MG tablet Take 1 tablet (25 mg total) by mouth daily.  30 tablet  1  . DISCONTD: lisinopril (PRINIVIL,ZESTRIL) 20 MG tablet Take 1 tablet (20 mg total) by mouth 2 (two) times daily.  60 tablet  1  . DISCONTD: metoprolol succinate (TOPROL-XL) 25 MG 24 hr tablet Take 1 tablet (25 mg total) by mouth daily.  30 tablet  1  . ferrous sulfate 325 (65 FE) MG tablet Take 1 tablet (325 mg total) by mouth 2 (two) times daily with a meal.  60 tablet  1  . pantoprazole (PROTONIX) 40 MG tablet Take 1 tablet (40 mg total) by mouth at bedtime.  30 tablet  1    BP 180/120  Pulse 68  Temp 98.4 F (36.9 C) (Oral)  Ht 5\' 1"  (1.549 m)  Wt 127 lb (57.607 kg)  BMI 24.00 kg/m2chart    Objective:   Physical Exam  Constitutional: She is oriented to person, place, and time.  She appears well-developed and well-nourished.  HENT:  Right Ear: External ear normal.  Left Ear: External ear normal.  Nose: Nose normal.  Mouth/Throat: Oropharynx is clear and moist.  Neck: Normal range of motion. Neck supple.  Cardiovascular: Normal rate, regular rhythm and normal heart sounds.   Pulmonary/Chest: Effort normal and breath sounds normal.  Musculoskeletal: Normal range of motion.  Neurological: She is alert and oriented to person, place, and time.  Skin: Skin is warm and dry.  Psychiatric: She has a normal mood and affect.          Assessment & Plan:  Assessment:CPX, HTN, Hypothyroidism, CVA  Plan: Labs then to include BMP, CBC, lipids, TSH. Will notify patient of results. Increase Norvasc to 10 mg once daily. Low sodium diet. Followup with neurology  as scheduled. Encouraged healthy diet, exercise, self breast exams. Mammogram referral done. Colonoscopy referral done.

## 2011-08-14 NOTE — Progress Notes (Signed)
  Subjective:    Patient ID: Jacqueline King, female    DOB: 11/07/1945, 66 y.o.   MRN: [id number]  HPI    Review of Systems     Objective:   Physical Exam        Assessment & Plan:

## 2011-08-14 NOTE — Patient Instructions (Addendum)
We have increased your Norvasc to 10mg  a day. Please notify me if your develop any swelling to your ankles.  Hypertension As your heart beats, it forces blood through your arteries. This force is your blood pressure. If the pressure is too high, it is called hypertension (HTN) or high blood pressure. HTN is dangerous because you may have it and not know it. High blood pressure may mean that your heart has to work harder to pump blood. Your arteries may be narrow or stiff. The extra work puts you at risk for heart disease, stroke, and other problems.  Blood pressure consists of two numbers, a higher number over a lower, 110/72, for example. It is stated as "110 over 72." The ideal is below 120 for the top number (systolic) and under 80 for the bottom (diastolic). Write down your blood pressure today. You should pay close attention to your blood pressure if you have certain conditions such as:  Heart failure.   Prior heart attack.   Diabetes   Chronic kidney disease.   Prior stroke.   Multiple risk factors for heart disease.  To see if you have HTN, your blood pressure should be measured while you are seated with your arm held at the level of the heart. It should be measured at least twice. A one-time elevated blood pressure reading (especially in the Emergency Department) does not mean that you need treatment. There may be conditions in which the blood pressure is different between your right and left arms. It is important to see your caregiver soon for a recheck. Most people have essential hypertension which means that there is not a specific cause. This type of high blood pressure may be lowered by changing lifestyle factors such as:  Stress.   Smoking.   Lack of exercise.   Excessive weight.   Drug/tobacco/alcohol use.   Eating less salt.  Most people do not have symptoms from high blood pressure until it has caused damage to the body. Effective treatment can often prevent, delay  or reduce that damage. TREATMENT  When a cause has been identified, treatment for high blood pressure is directed at the cause. There are a large number of medications to treat HTN. These fall into several categories, and your caregiver will help you select the medicines that are best for you. Medications may have side effects. You should review side effects with your caregiver. If your blood pressure stays high after you have made lifestyle changes or started on medicines,   Your medication(s) may need to be changed.   Other problems may need to be addressed.   Be certain you understand your prescriptions, and know how and when to take your medicine.   Be sure to follow up with your caregiver within the time frame advised (usually within two weeks) to have your blood pressure rechecked and to review your medications.   If you are taking more than one medicine to lower your blood pressure, make sure you know how and at what times they should be taken. Taking two medicines at the same time can result in blood pressure that is too low.  SEEK IMMEDIATE MEDICAL CARE IF:  You develop a severe headache, blurred or changing vision, or confusion.   You have unusual weakness or numbness, or a faint feeling.   You have severe chest or abdominal pain, vomiting, or breathing problems.  MAKE SURE YOU:   Understand these instructions.   Will watch your condition.   Will get  help right away if you are not doing well or get worse.  Document Released: 01/14/2005 Document Revised: 01/03/2011 Document Reviewed: 09/04/2007 [facility] Patient Information 2012 [place], [place].

## 2011-08-15 ENCOUNTER — Other Ambulatory Visit: Payer: Self-pay | Admitting: Family

## 2011-08-15 MED ORDER — LEVOTHYROXINE SODIUM 50 MCG PO TABS: 50.0000 ug | ORAL_TABLET | Freq: Every day | ORAL | Status: DC

## 2011-08-16 ENCOUNTER — Ambulatory Visit: Payer: BC Managed Care – PPO

## 2011-08-16 ENCOUNTER — Ambulatory Visit: Payer: BC Managed Care – PPO | Admitting: Physical Therapy

## 2011-08-16 ENCOUNTER — Ambulatory Visit: Payer: BC Managed Care – PPO | Admitting: Occupational Therapy

## 2011-08-19 ENCOUNTER — Encounter: Payer: BC Managed Care – PPO | Attending: Physical Medicine & Rehabilitation

## 2011-08-19 ENCOUNTER — Encounter: Payer: Self-pay | Admitting: Physical Medicine & Rehabilitation

## 2011-08-19 ENCOUNTER — Ambulatory Visit (HOSPITAL_BASED_OUTPATIENT_CLINIC_OR_DEPARTMENT_OTHER): Payer: BC Managed Care – PPO | Admitting: Physical Medicine & Rehabilitation

## 2011-08-19 ENCOUNTER — Inpatient Hospital Stay: Payer: BC Managed Care – PPO | Admitting: Physical Medicine & Rehabilitation

## 2011-08-19 VITALS — BP 154/81 | HR 74 | Resp 16 | Ht 61.0 in | Wt 122.8 lb

## 2011-08-19 DIAGNOSIS — I69993 Ataxia following unspecified cerebrovascular disease: Secondary | ICD-10-CM

## 2011-08-19 DIAGNOSIS — I619 Nontraumatic intracerebral hemorrhage, unspecified: Secondary | ICD-10-CM

## 2011-08-19 DIAGNOSIS — I69959 Hemiplegia and hemiparesis following unspecified cerebrovascular disease affecting unspecified side: Secondary | ICD-10-CM | POA: Insufficient documentation

## 2011-08-19 NOTE — Progress Notes (Signed)
Subjective:    Patient ID: Margert Edsall, female    DOB: [birth year], 66 y.o.   MRN: [id number]  HPI Ms. Karl Knarr is a 66 year old right  handed female with history of uncontrolled hypertension and  hypothyroidism. No medication times years, admitted on July 01, 2011  with dizziness and nausea. The patient was noted to have malignant  hypertension with blood pressure 200/100 as well as nystagmus, ataxia  and nausea. MRI of brain done revealed 9 x 11-mm acute hemorrhage in  right superior vermis with intraventricular extension. Small foci of  acute infarct in right medial thalamus and superior peduncle, and  multiple foci of chronic infarcts and chronic hemorrhage bilaterally. A  2D echo done, revealed mild LVH with EF of 55-65%. Carotid Doppler  showed no ICA stenosis. The patient was evaluated by Neurology who  recommended reducing stroke risk factors with control of dyslipidemia,  optimum blood pressure control. A CT of abdomen was done for workup of  uncontrolled hypertension and revealed vessel irregularity of the mid  and distal right renal artery with ectasia SMA, small-to-moderate-sized  pericardial fluid, and diffuse wall thickening of the rectal and sigmoid  colon suspicious for colitis. The patient was followed by IMTS for  medical issues. They indicated that the patient has renal artery  angioplasty in the past and focus now would be medical management for  treatment of hypertension.  Hospital discharge was 07/16/2011 Currently attending outpatient therapies with PT and OT with:[facility] Pain Inventory Average Pain 0 Pain Right Now 0 My pain is no pain  In the last 24 hours, has pain interfered with the following? General activity 0 Relation with others 0 Enjoyment of life 0 What TIME of day is your pain at its worst? no pain Sleep (in general) Good  Pain is worse with: no pain Pain improves with: no pain Relief from Meds: no  pain  Mobility use a walker ability to climb steps?  yes do you drive?  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  no  Physicians involved in your care Any changes since last visit?  no   Family History  Problem Relation Age of Onset  . Diabetes Mother   . Diabetes Sister   . Diabetes Brother    History   Social History  . Marital Status: Widowed    Spouse Name: N/A    Number of Children: N/A  . Years of Education: N/A   Social History Main Topics  . Smoking status: Former Games developer  . Smokeless tobacco: Never Used  . Alcohol Use: No  . Drug Use: No  . Sexually Active: None   Other Topics Concern  . None   Social History Narrative  . None   Past Surgical History  Procedure Date  . Abdominal hysterectomy   . Cholecystectomy   . Renal artery angioplasty 1993 or 1994   Past Medical History  Diagnosis Date  . Hypertension   . H/O radioactive iodine thyroid ablation   . Stroke    BP 154/81  Pulse 74  Resp 16  Ht 5\' 1"  (1.549 m)  Wt 122 lb 12.8 oz (55.702 kg)  BMI 23.20 kg/m2  SpO2 100%    Review of Systems  All other systems reviewed and are negative.       Objective:  Physical Exam  Constitutional: She is oriented to person, place, and time. She appears well-developed and well-nourished.  Musculoskeletal: Normal range of motion.       Right shoulder: Normal.       Left shoulder: Normal.  Neurological: She is alert and oriented to person, place, and time. She displays no atrophy. No sensory deficit. She exhibits normal muscle tone. Gait abnormal.       4/5 on Left side  Gait imbalance  Psychiatric: She has a normal mood and affect.          Assessment & Plan:  Right PCA and right vertebral artery distribution infarcts she has mild left hemiparesis. She has truncal ataxia but  no significant limb ataxia. She has dysarthria but no aphasia.Overall she is improving. I recommend continuing outpatient PT OT and speech therapy. We discussed gradual return to driving. See below  Recheck in clinic one month  Graduated return to driving instructions were provided. It is recommended that the patient first drives with another licensed driver in an empty parking lot. If the patient does well with this, and they can drive on a quiet street with the licensed driver. If the patient does well with this they can drive on a busy street with a licensed driver. If the patient does well with this, the next time out they can go by himself. For the first month after resuming driving, I recommend no nighttime or Interstate driving.

## 2011-08-19 NOTE — Patient Instructions (Signed)

## 2011-08-20 ENCOUNTER — Ambulatory Visit: Payer: BC Managed Care – PPO | Admitting: Occupational Therapy

## 2011-08-20 ENCOUNTER — Ambulatory Visit: Payer: BC Managed Care – PPO

## 2011-08-20 ENCOUNTER — Ambulatory Visit: Payer: BC Managed Care – PPO | Admitting: Physical Therapy

## 2011-08-22 ENCOUNTER — Ambulatory Visit: Payer: BC Managed Care – PPO | Admitting: Physical Therapy

## 2011-08-22 ENCOUNTER — Ambulatory Visit: Payer: BC Managed Care – PPO | Admitting: Occupational Therapy

## 2011-08-22 ENCOUNTER — Ambulatory Visit: Payer: BC Managed Care – PPO | Admitting: *Deleted

## 2011-08-23 ENCOUNTER — Ambulatory Visit: Payer: BC Managed Care – PPO

## 2011-08-26 ENCOUNTER — Telehealth: Payer: Self-pay | Admitting: Family

## 2011-08-26 ENCOUNTER — Telehealth: Payer: Self-pay

## 2011-08-26 MED ORDER — LEVOTHYROXINE SODIUM 50 MCG PO TABS: 50.0000 ug | ORAL_TABLET | Freq: Every day | ORAL | Status: DC

## 2011-08-26 NOTE — Telephone Encounter (Signed)
Returned call. Voicemail is unable to accept messages. Labs were mailed to pt on 08/16/11 after several attempts at reaching her by phone

## 2011-08-26 NOTE — Telephone Encounter (Signed)
Pt requesting results of labs.

## 2011-08-26 NOTE — Telephone Encounter (Signed)
Pt aware of results. Labs resent and Rx for synthroid escribed

## 2011-08-27 ENCOUNTER — Ambulatory Visit: Payer: BC Managed Care – PPO | Admitting: Occupational Therapy

## 2011-08-27 ENCOUNTER — Ambulatory Visit: Payer: BC Managed Care – PPO | Admitting: Physical Therapy

## 2011-08-27 ENCOUNTER — Ambulatory Visit: Payer: BC Managed Care – PPO

## 2011-08-29 ENCOUNTER — Ambulatory Visit: Payer: BC Managed Care – PPO

## 2011-08-29 ENCOUNTER — Ambulatory Visit: Payer: BC Managed Care – PPO | Admitting: Physical Therapy

## 2011-08-29 ENCOUNTER — Ambulatory Visit: Payer: BC Managed Care – PPO | Attending: Physical Medicine & Rehabilitation | Admitting: Occupational Therapy

## 2011-08-29 DIAGNOSIS — R269 Unspecified abnormalities of gait and mobility: Secondary | ICD-10-CM | POA: Insufficient documentation

## 2011-08-29 DIAGNOSIS — Z5189 Encounter for other specified aftercare: Secondary | ICD-10-CM | POA: Insufficient documentation

## 2011-08-29 DIAGNOSIS — R279 Unspecified lack of coordination: Secondary | ICD-10-CM | POA: Insufficient documentation

## 2011-08-29 DIAGNOSIS — I69998 Other sequelae following unspecified cerebrovascular disease: Secondary | ICD-10-CM | POA: Insufficient documentation

## 2011-08-29 DIAGNOSIS — M6281 Muscle weakness (generalized): Secondary | ICD-10-CM | POA: Insufficient documentation

## 2011-08-29 DIAGNOSIS — I69919 Unspecified symptoms and signs involving cognitive functions following unspecified cerebrovascular disease: Secondary | ICD-10-CM | POA: Insufficient documentation

## 2011-09-03 ENCOUNTER — Ambulatory Visit: Payer: BC Managed Care – PPO

## 2011-09-03 ENCOUNTER — Ambulatory Visit: Payer: BC Managed Care – PPO | Admitting: Occupational Therapy

## 2011-09-03 ENCOUNTER — Ambulatory Visit: Payer: BC Managed Care – PPO | Admitting: Physical Therapy

## 2011-09-05 ENCOUNTER — Ambulatory Visit: Payer: BC Managed Care – PPO | Admitting: Occupational Therapy

## 2011-09-05 ENCOUNTER — Ambulatory Visit: Payer: BC Managed Care – PPO

## 2011-09-05 ENCOUNTER — Ambulatory Visit: Payer: BC Managed Care – PPO | Admitting: Physical Therapy

## 2011-09-09 ENCOUNTER — Ambulatory Visit: Payer: BC Managed Care – PPO | Admitting: Physical Therapy

## 2011-09-11 ENCOUNTER — Ambulatory Visit: Payer: BC Managed Care – PPO

## 2011-09-12 ENCOUNTER — Ambulatory Visit: Payer: BC Managed Care – PPO

## 2011-09-13 ENCOUNTER — Ambulatory Visit: Payer: BC Managed Care – PPO | Admitting: Physical Therapy

## 2011-09-16 ENCOUNTER — Ambulatory Visit: Payer: BC Managed Care – PPO | Admitting: Physical Therapy

## 2011-09-16 ENCOUNTER — Other Ambulatory Visit: Payer: Self-pay

## 2011-09-16 ENCOUNTER — Ambulatory Visit: Payer: BC Managed Care – PPO | Admitting: Speech Pathology

## 2011-09-17 ENCOUNTER — Ambulatory Visit (HOSPITAL_BASED_OUTPATIENT_CLINIC_OR_DEPARTMENT_OTHER): Payer: BC Managed Care – PPO | Admitting: Physical Medicine & Rehabilitation

## 2011-09-17 ENCOUNTER — Encounter: Payer: Self-pay | Admitting: Physical Medicine & Rehabilitation

## 2011-09-17 ENCOUNTER — Encounter: Payer: BC Managed Care – PPO | Attending: Physical Medicine & Rehabilitation

## 2011-09-17 VITALS — BP 166/87 | HR 78 | Resp 16 | Ht 61.0 in | Wt 127.0 lb

## 2011-09-17 DIAGNOSIS — I69959 Hemiplegia and hemiparesis following unspecified cerebrovascular disease affecting unspecified side: Secondary | ICD-10-CM | POA: Insufficient documentation

## 2011-09-17 DIAGNOSIS — I69993 Ataxia following unspecified cerebrovascular disease: Secondary | ICD-10-CM | POA: Insufficient documentation

## 2011-09-17 DIAGNOSIS — I619 Nontraumatic intracerebral hemorrhage, unspecified: Secondary | ICD-10-CM

## 2011-09-17 NOTE — Progress Notes (Signed)
Subjective:    Patient ID: Jacqueline King, female    DOB: 02-05-1945, 66 y.o.   MRN: [id number] Subjective:   Patient ID: Jacqueline King, female DOB: 1945-03-19, 66 y.o. MRN: [id number]  HPI  [name] is a 65 year old right  handed female with history of uncontrolled hypertension and  hypothyroidism. No medication times years, admitted on July 01, 2011  with dizziness and nausea. The patient was noted to have malignant  hypertension with blood pressure 200/100 as well as nystagmus, ataxia  and nausea. MRI of brain done revealed 9 x 11-mm acute hemorrhage in  right superior vermis with intraventricular extension. Small foci of  acute infarct in right medial thalamus and superior peduncle, and  multiple foci of chronic infarcts and chronic hemorrhage bilaterally. A  2D echo done, revealed mild LVH with EF of 55-65%. Carotid Doppler  showed no ICA stenosis. The patient was evaluated by Neurology who  recommended reducing stroke risk factors with control of dyslipidemia,  optimum blood pressure control  HPI Driving slowly, other drivers honking at her Unable to write well, continuing SLP Pain Inventory Average Pain 0 Pain Right Now 0 My pain is N/A  In the last 24 hours, has pain interfered with the following? General activity 0 Relation with others 0 Enjoyment of life 0 What TIME of day is your pain at its worst? N/A Sleep (in general) Good  Pain is worse with: N/A Pain improves with: N/A Relief from Meds: N/A  Mobility walk with assistance use a walker  Function employed # of hrs/week   Neuro/Psych No problems in this area  Prior Studies Any changes since last visit?  no  Physicians involved in your care Any changes since last visit?  no   Family History  Problem Relation Age of Onset  . Diabetes Mother   . Diabetes Sister   . Diabetes Brother    History   Social History  . Marital Status: Widowed    Spouse Name: N/A    Number of Children: N/A  .  Years of Education: N/A   Social History Main Topics  . Smoking status: Former Games developer  . Smokeless tobacco: Never Used  . Alcohol Use: No  . Drug Use: No  . Sexually Active: None   Other Topics Concern  . None   Social History Narrative  . None   Past Surgical History  Procedure Date  . Abdominal hysterectomy   . Cholecystectomy   . Renal artery angioplasty 1993 or 1994   Past Medical History  Diagnosis Date  . Hypertension   . H/O radioactive iodine thyroid ablation   . Stroke    BP 166/87  Pulse 78  Resp 16  Ht 5\' 1"  (1.549 m)  Wt 127 lb (57.607 kg)  BMI 24.00 kg/m2  SpO2 99%      Review of Systems  Constitutional: Negative.   HENT: Negative.   Eyes: Negative.   Respiratory: Negative.   Cardiovascular: Negative.   Gastrointestinal: Negative.   Genitourinary: Negative.   Musculoskeletal: Negative.   Skin: Negative.   Neurological: Negative.   Hematological: Negative.   Psychiatric/Behavioral: Negative.        Objective:   Physical Exam   Physical Exam  Constitutional: She is oriented to person, place, and time. She appears well-developed and well-nourished.  Musculoskeletal: Normal range of motion.  Right shoulder: Normal.  Left shoulder: Normal.  Neurological: She is alert and oriented to person, place, and time. She displays no atrophy. No sensory  deficit. She exhibits normal muscle tone. Gait abnormal.  Strength is equal bilateral UE and LE  Gait imbalance  Psychiatric: She has a normal mood and affect      Assessment & Plan:  Right PCA and right vertebral artery distribution infarcts she has mild left hemiparesis. She has truncal ataxia but no significant limb ataxia. She has dysarthria but no aphasia.Overall she is improving. I recommend continuing outpatient PT OT and speech therapy.  Rec only drive with another licensed driver in your car.  Do not drive alone Recheck in clinic 2 month

## 2011-09-17 NOTE — Patient Instructions (Signed)
No driving alone. May drive daytime , non-congested area with another drive Cont [place]

## 2011-09-19 ENCOUNTER — Ambulatory Visit: Payer: BC Managed Care – PPO | Admitting: Physical Therapy

## 2011-09-19 ENCOUNTER — Ambulatory Visit: Payer: BC Managed Care – PPO | Admitting: *Deleted

## 2011-09-23 ENCOUNTER — Ambulatory Visit: Payer: BC Managed Care – PPO

## 2011-09-24 ENCOUNTER — Other Ambulatory Visit (INDEPENDENT_AMBULATORY_CARE_PROVIDER_SITE_OTHER): Payer: BC Managed Care – PPO

## 2011-09-24 DIAGNOSIS — E890 Postprocedural hypothyroidism: Secondary | ICD-10-CM

## 2011-09-25 ENCOUNTER — Other Ambulatory Visit: Payer: Self-pay | Admitting: Family

## 2011-09-25 ENCOUNTER — Ambulatory Visit: Payer: BC Managed Care – PPO

## 2011-09-25 MED ORDER — LEVOTHYROXINE SODIUM 75 MCG PO TABS: 75.0000 ug | ORAL_TABLET | Freq: Every day | ORAL | Status: DC

## 2011-09-25 NOTE — Progress Notes (Signed)
Quick Note:  Left a message for return call. ______ 

## 2011-09-27 ENCOUNTER — Ambulatory Visit
Admission: RE | Admit: 2011-09-27 | Discharge: 2011-09-27 | Disposition: A | Discharge status: Home / Self Care | Payer: BC Managed Care – PPO | Source: Ambulatory Visit | Attending: Family | Admitting: Family

## 2011-09-27 DIAGNOSIS — Z1231 Encounter for screening mammogram for malignant neoplasm of breast: Secondary | ICD-10-CM

## 2011-10-01 ENCOUNTER — Ambulatory Visit: Payer: BC Managed Care – PPO | Attending: Physical Medicine & Rehabilitation

## 2011-10-01 DIAGNOSIS — I69998 Other sequelae following unspecified cerebrovascular disease: Secondary | ICD-10-CM | POA: Insufficient documentation

## 2011-10-01 DIAGNOSIS — R269 Unspecified abnormalities of gait and mobility: Secondary | ICD-10-CM | POA: Insufficient documentation

## 2011-10-01 DIAGNOSIS — R279 Unspecified lack of coordination: Secondary | ICD-10-CM | POA: Insufficient documentation

## 2011-10-01 DIAGNOSIS — Z5189 Encounter for other specified aftercare: Secondary | ICD-10-CM | POA: Insufficient documentation

## 2011-10-01 DIAGNOSIS — M6281 Muscle weakness (generalized): Secondary | ICD-10-CM | POA: Insufficient documentation

## 2011-10-01 DIAGNOSIS — I69919 Unspecified symptoms and signs involving cognitive functions following unspecified cerebrovascular disease: Secondary | ICD-10-CM | POA: Insufficient documentation

## 2011-10-01 NOTE — Progress Notes (Signed)
Quick Note:  Pt not available. Will attempt to call at a later time. ______

## 2011-10-02 ENCOUNTER — Ambulatory Visit: Payer: BC Managed Care – PPO | Admitting: *Deleted

## 2011-10-03 NOTE — Progress Notes (Signed)
Quick Note:  Left a message for pt to return call. ______ 

## 2011-10-04 ENCOUNTER — Encounter: Payer: Medicare Other | Admitting: *Deleted

## 2011-10-09 ENCOUNTER — Ambulatory Visit: Payer: BC Managed Care – PPO | Admitting: *Deleted

## 2011-10-09 NOTE — Progress Notes (Signed)
Quick Note:    Letter mailed.  ______

## 2011-10-11 ENCOUNTER — Telehealth: Payer: Self-pay | Admitting: Family

## 2011-10-11 ENCOUNTER — Encounter: Payer: Self-pay | Admitting: Family Medicine

## 2011-10-11 ENCOUNTER — Ambulatory Visit (INDEPENDENT_AMBULATORY_CARE_PROVIDER_SITE_OTHER): Payer: BC Managed Care – PPO | Admitting: Family Medicine

## 2011-10-11 ENCOUNTER — Ambulatory Visit: Payer: BC Managed Care – PPO | Admitting: *Deleted

## 2011-10-11 VITALS — BP 110/80 | Temp 98.6°F | Wt 123.0 lb

## 2011-10-11 DIAGNOSIS — R059 Cough, unspecified: Secondary | ICD-10-CM

## 2011-10-11 DIAGNOSIS — R05 Cough: Secondary | ICD-10-CM

## 2011-10-11 MED ORDER — HYDROCODONE-HOMATROPINE 5-1.5 MG/5ML PO SYRP: 5.0000 mL | ORAL_SOLUTION | Freq: Four times a day (QID) | ORAL | Status: AC | PRN

## 2011-10-11 NOTE — Telephone Encounter (Signed)
Appt scheduled by CAN.

## 2011-10-11 NOTE — Patient Instructions (Addendum)
Follow up promptly for any fever or shortness of breath If cough persists into next week, we may need to consider switching off Lisinopril.

## 2011-10-11 NOTE — Telephone Encounter (Signed)
Caller: Latesia/Child; Patient Name: Jacqueline King; PCP: Evelena Pe[facility]); Best Callback Phone Number: [phone]; Call regarding Cough after taking Lisinopril since June 2013, Cough has worsen within last 2 weeks. Patient wants to switch medications. Afebrile. All emergent symptoms ruled out per Cough Protocol, see in 24 hours, due to Cough starting after Ace inhibitors.  Appointment scheduled on 9-13 at 1330 with Dr Caryl Never.  Patient verbalized understanding.

## 2011-10-11 NOTE — Progress Notes (Signed)
  Subjective:    Patient ID: Jacqueline King, female    DOB: Mar 28, 1945, 66 y.o.   MRN: [id number]  HPI  Patient is an ex-smoker seen with 2 week history of dry cough. Denies any significant nasal congestion. No wheezing. No dyspnea. No hemoptysis. No pleuritic pain. No GERD symptoms. Patient had history of cerebral hemorrhage and stroke back in June. At time of diagnosis she was not taking any blood pressure medications. She is currently taking amlodipine, chlorthalidone, and lisinopril. She takes 20 mg twice daily. Also metoprolol. Compliant with blood pressure medications and blood pressure well-controlled but she's not been taking her Lipitor.  Past Medical History  Diagnosis Date  . Hypertension   . H/O radioactive iodine thyroid ablation   . Stroke    Past Surgical History  Procedure Date  . Abdominal hysterectomy   . Cholecystectomy   . Renal artery angioplasty 1993 or 1994    reports that she has quit smoking. She has never used smokeless tobacco. She reports that she does not drink alcohol or use illicit drugs. family history includes Diabetes in her brother, mother, and sister. Allergies  Allergen Reactions  . Sulfa Drugs Cross Reactors Hives      Review of Systems  Constitutional: Negative for fever, chills, appetite change and unexpected weight change.  HENT: Negative for congestion, sore throat, postnasal drip and sinus pressure.   Respiratory: Positive for cough. Negative for shortness of breath and wheezing.   Cardiovascular: Negative for chest pain, palpitations and leg swelling.  Neurological: Negative for dizziness.       Objective:   Physical Exam  Constitutional: She appears well-developed and well-nourished.  HENT:  Right Ear: External ear normal.  Left Ear: External ear normal.  Mouth/Throat: Oropharynx is clear and moist.  Neck: Neck supple.  Cardiovascular: Normal rate and regular rhythm.   Pulmonary/Chest: Effort normal and breath sounds normal. No  respiratory distress. She has no wheezes. She has no rales.  Lymphadenopathy:    She has no cervical adenopathy.          Assessment & Plan:  Cough. Question viral bronchitis versus ACE inhibitor related. Patient somewhat reluctant to change medications at this time. Hycodan cough syrup 1 teaspoon every 6 hours for cough suppression. Followup promptly for any fever. Touch base next week if no better. Consider alternative, possibly losartan if cough persists that time.

## 2011-10-15 ENCOUNTER — Ambulatory Visit: Payer: BC Managed Care – PPO | Admitting: Speech Pathology

## 2011-10-17 ENCOUNTER — Ambulatory Visit: Payer: BC Managed Care – PPO | Admitting: *Deleted

## 2011-10-21 ENCOUNTER — Telehealth: Payer: Self-pay | Admitting: Family

## 2011-10-21 MED ORDER — LEVOTHYROXINE SODIUM 75 MCG PO TABS: 75.0000 ug | ORAL_TABLET | Freq: Every day | ORAL | Status: DC

## 2011-10-21 NOTE — Telephone Encounter (Signed)
Pt aware synthroid increased to and Rx sent to pharmacy

## 2011-10-21 NOTE — Telephone Encounter (Signed)
Pt called re: increasing med for thyroid. Pt wants to know what the increase will be and if script has been sent to pharmacy?

## 2011-10-23 ENCOUNTER — Ambulatory Visit: Payer: BC Managed Care – PPO

## 2011-10-25 ENCOUNTER — Ambulatory Visit: Payer: BC Managed Care – PPO | Admitting: *Deleted

## 2011-11-14 ENCOUNTER — Encounter: Payer: Self-pay | Admitting: Family

## 2011-11-14 ENCOUNTER — Ambulatory Visit (INDEPENDENT_AMBULATORY_CARE_PROVIDER_SITE_OTHER): Payer: BC Managed Care – PPO | Admitting: Family

## 2011-11-14 VITALS — BP 112/80 | HR 91 | Temp 98.6°F | Wt 118.0 lb

## 2011-11-14 DIAGNOSIS — I10 Essential (primary) hypertension: Secondary | ICD-10-CM

## 2011-11-14 DIAGNOSIS — T44905A Adverse effect of unspecified drugs primarily affecting the autonomic nervous system, initial encounter: Secondary | ICD-10-CM

## 2011-11-14 DIAGNOSIS — E039 Hypothyroidism, unspecified: Secondary | ICD-10-CM

## 2011-11-14 DIAGNOSIS — T464X5A Adverse effect of angiotensin-converting-enzyme inhibitors, initial encounter: Secondary | ICD-10-CM

## 2011-11-14 DIAGNOSIS — R059 Cough, unspecified: Secondary | ICD-10-CM

## 2011-11-14 DIAGNOSIS — R05 Cough: Secondary | ICD-10-CM

## 2011-11-14 LAB — LIPID PANEL
Cholesterol: 157 mg/dL (ref 0–200)
HDL: 59.1 mg/dL (ref >39.00)
Triglycerides: 57 mg/dL (ref 0.0–149.0)

## 2011-11-14 LAB — CBC WITH DIFFERENTIAL/PLATELET
Basophils Absolute: 0 10*3/uL (ref 0.0–0.1)
Eosinophils Absolute: 0 10*3/uL (ref 0.0–0.7)
Eosinophils Relative: 0.5 % (ref 0.0–5.0)
HCT: 33.8 % — ABNORMAL LOW (ref 36.0–46.0)
Lymphs Abs: 1.6 10*3/uL (ref 0.7–4.0)
MCHC: 33.2 g/dL (ref 30.0–36.0)
MCV: 88.3 fl (ref 78.0–100.0)
Monocytes Absolute: 0.7 10*3/uL (ref 0.1–1.0)
Platelets: 226 10*3/uL (ref 150.0–400.0)
RDW: 14.3 % (ref 11.5–14.6)

## 2011-11-14 LAB — BASIC METABOLIC PANEL
Calcium: 9.7 mg/dL (ref 8.4–10.5)
GFR: 115.21 mL/min (ref >60.00)
Potassium: 3.4 mEq/L — ABNORMAL LOW (ref 3.5–5.1)
Sodium: 136 mEq/L (ref 135–145)

## 2011-11-14 LAB — TSH: TSH: 0.25 u[IU]/mL — ABNORMAL LOW (ref 0.35–5.50)

## 2011-11-14 MED ORDER — METOPROLOL SUCCINATE ER 50 MG PO TB24: 50.0000 mg | ORAL_TABLET | Freq: Every day | ORAL | Status: DC

## 2011-11-14 NOTE — Progress Notes (Signed)
Subjective:    Patient ID: Jacqueline King, female    DOB: [birth year], 66 y.o.   MRN: [id number]  HPI 66 year old African American female, nonsmoker is in for recheck of hypertension, hyperlipidemia, hypothyroidism, and GERD. She's currently stable on her medications. Patient is normal markedly well since her stroke several months ago. She is ambulating unassisted and speaking more clearly. Has concerns of a cough today that she believes may be related to her lisinopril. She was seen by Dr. Caryl Never last month with a cough that was suspicious to have ACE inhibitor as a cough. Cough is dry and tacky, particularly in the evenings and at night.   Review of Systems  Constitutional: Negative.   HENT: Negative.   Eyes: Negative.   Respiratory: Negative.   Cardiovascular: Negative.   Gastrointestinal: Negative.   Genitourinary: Negative.   Musculoskeletal: Negative.   Skin: Negative.   Neurological: Negative.   Hematological: Negative.   Psychiatric/Behavioral: Negative.    Past Medical History  Diagnosis Date  . Hypertension   . H/O radioactive iodine thyroid ablation   . Stroke     History   Social History  . Marital Status: Widowed    Spouse Name: N/A    Number of Children: N/A  . Years of Education: N/A   Occupational History  . Not on file.   Social History Main Topics  . Smoking status: Former Games developer  . Smokeless tobacco: Never Used  . Alcohol Use: No  . Drug Use: No  . Sexually Active: Not on file   Other Topics Concern  . Not on file   Social History Narrative  . No narrative on file    Past Surgical History  Procedure Date  . Abdominal hysterectomy   . Cholecystectomy   . Renal artery angioplasty 1993 or 1994    Family History  Problem Relation Age of Onset  . Diabetes Mother   . Diabetes Sister   . Diabetes Brother     Allergies  Allergen Reactions  . Sulfa Drugs Cross Reactors Hives    Current Outpatient Prescriptions on File Prior to Visit    Medication Sig Dispense Refill  . amLODipine (NORVASC) 10 MG tablet Take 1 tablet (10 mg total) by mouth daily.  30 tablet  5  . atorvastatin (LIPITOR) 40 MG tablet Take 1 tablet (40 mg total) by mouth daily at 6 PM.  30 tablet  5  . chlorthalidone (HYGROTON) 25 MG tablet Take 1 tablet (25 mg total) by mouth daily.  30 tablet  5  . ferrous sulfate 325 (65 FE) MG tablet Take 1 tablet (325 mg total) by mouth 2 (two) times daily with a meal.  60 tablet  1  . levothyroxine (SYNTHROID, LEVOTHROID) 75 MCG tablet Take 1 tablet (75 mcg total) by mouth daily before breakfast.  30 tablet  3  . pantoprazole (PROTONIX) 40 MG tablet Take 1 tablet (40 mg total) by mouth at bedtime.  30 tablet  1  . DISCONTD: metoprolol succinate (TOPROL-XL) 25 MG 24 hr tablet Take 1 tablet (25 mg total) by mouth daily.  30 tablet  5    BP 112/80  Pulse 91  Temp 98.6 F (37 C) (Oral)  Wt 118 lb (53.524 kg)  SpO2 98%chart    Objective:   Physical Exam  Constitutional: She is oriented to person, place, and time. She appears well-developed and well-nourished.  HENT:  Right Ear: External ear normal.  Left Ear: External ear normal.  Nose: Nose normal.  Mouth/Throat: Oropharynx is clear and moist.  Neck: Normal range of motion. Neck supple.  Cardiovascular: Normal rate, regular rhythm and normal heart sounds.   Pulmonary/Chest: Effort normal and breath sounds normal.  Abdominal: Soft. Bowel sounds are normal.  Musculoskeletal: Normal range of motion.  Neurological: She is alert and oriented to person, place, and time.  Skin: Skin is warm and dry.  Psychiatric: She has a normal mood and affect.          Assessment & Plan:  Assessment: Hypertension, Hypothyroidism, Hyperlipidemia  Plan: Labs sent. D/C lisinopril. Increase metoprolol to 50mg  daily. Encouraged a low sodium diet. Encouraged flu shot, patient declined. We'll followup with patient in 2 weeks and sooner when necessary.

## 2011-11-14 NOTE — Patient Instructions (Addendum)
ADVISED INFLUENZA VACCINE!!!   The patient is instructed to continue all medications as prescribed. Schedule followup with check out clerk upon leaving the clinic

## 2011-11-15 ENCOUNTER — Ambulatory Visit: Payer: Medicare Other | Admitting: Physical Medicine & Rehabilitation

## 2011-11-18 ENCOUNTER — Encounter: Payer: BC Managed Care – PPO | Attending: Physical Medicine & Rehabilitation

## 2011-11-18 ENCOUNTER — Ambulatory Visit (HOSPITAL_BASED_OUTPATIENT_CLINIC_OR_DEPARTMENT_OTHER): Payer: BC Managed Care – PPO | Admitting: Physical Medicine & Rehabilitation

## 2011-11-18 ENCOUNTER — Encounter: Payer: Self-pay | Admitting: Physical Medicine & Rehabilitation

## 2011-11-18 VITALS — BP 144/84 | HR 81 | Resp 14 | Ht 61.0 in | Wt 113.8 lb

## 2011-11-18 DIAGNOSIS — I69959 Hemiplegia and hemiparesis following unspecified cerebrovascular disease affecting unspecified side: Secondary | ICD-10-CM | POA: Insufficient documentation

## 2011-11-18 DIAGNOSIS — I69993 Ataxia following unspecified cerebrovascular disease: Secondary | ICD-10-CM | POA: Insufficient documentation

## 2011-11-18 NOTE — Patient Instructions (Signed)
Please practice heel to toe walk with a cane Please walk for exercise with your daughter at least 3 times per week

## 2011-11-18 NOTE — Progress Notes (Signed)
Subjective:    Patient ID: Jacqueline King, female    DOB: Oct 16, 1945, 66 y.o.   MRN: [id number]  HPI Jacqueline King is a 66 year old right  handed female with history of uncontrolled hypertension and  hypothyroidism. No medication times years, admitted on July 01, 2011  with dizziness and nausea. The patient was noted to have malignant  hypertension with blood pressure 200/100 as well as nystagmus, ataxia  and nausea. MRI of brain done revealed 9 x 11-mm acute hemorrhage in  right superior vermis with intraventricular extension. Small foci of  acute infarct in right medial thalamus and superior peduncle, and  multiple foci of chronic infarcts and chronic hemorrhage bilaterally. A  2D echo done, revealed mild LVH with EF of 55-65%. Carotid Doppler  showed no ICA stenosis.  No new issues.  Had cough which resolved   Pain Inventory Average Pain 0 Pain Right Now 0 My pain is no pain  In the last 24 hours, has pain interfered with the following? General activity 0 Relation with others 0 Enjoyment of life 0 What TIME of day is your pain at its worst? no pain Sleep (in general) Good  Pain is worse with: no pain Pain improves with: no pain Relief from Meds: no pain  Mobility walk without assistance ability to climb steps?  yes do you drive?  yes  Function employed # of hrs/week currentlyl not working after stroke  Neuro/Psych No problems in this area  Prior Studies Any changes since last visit?  no  Physicians involved in your care Any changes since last visit?  no   Family History  Problem Relation Age of Onset  . Diabetes Mother   . Diabetes Sister   . Diabetes Brother    History   Social History  . Marital Status: Widowed    Spouse Name: N/A    Number of Children: N/A  . Years of Education: N/A   Social History Main Topics  . Smoking status: Former Games developer  . Smokeless tobacco: Never Used  . Alcohol Use: No  . Drug Use: No  . Sexually Active: None    Other Topics Concern  . None   Social History Narrative  . None   Past Surgical History  Procedure Date  . Abdominal hysterectomy   . Cholecystectomy   . Renal artery angioplasty 1993 or 1994   Past Medical History  Diagnosis Date  . Hypertension   . H/O radioactive iodine thyroid ablation   . Stroke    BP 144/84  Pulse 81  Resp 14  Ht 5\' 1"  (1.549 m)  Wt 113 lb 12.8 oz (51.619 kg)  BMI 21.50 kg/m2  SpO2 100%   Review of Systems  All other systems reviewed and are negative.       Objective:   Physical Exam Constitutional: She is oriented to person, place, and time. She appears well-developed and well-nourished.  Musculoskeletal: Normal range of motion.  Right shoulder: Normal.  Left shoulder: Normal.  Neurological: She is alert and oriented to person, place, and time. She displays no atrophy. No sensory deficit. She exhibits normal muscle tone. Gait abnormal.  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 She has truncal ataxia but no significant limb ataxia. She has dysarthria but no aphasia No further formal therapy needed at this time I'll see her back in 2 months Advised  walking every day or at least every other day with her daughter Practice tandem gait with a cane

## 2012-01-17 ENCOUNTER — Encounter: Payer: BC Managed Care – PPO | Attending: Physical Medicine & Rehabilitation

## 2012-01-17 ENCOUNTER — Ambulatory Visit (HOSPITAL_BASED_OUTPATIENT_CLINIC_OR_DEPARTMENT_OTHER): Payer: BC Managed Care – PPO | Admitting: Physical Medicine & Rehabilitation

## 2012-01-17 ENCOUNTER — Encounter: Payer: Self-pay | Admitting: Physical Medicine & Rehabilitation

## 2012-01-17 VITALS — BP 156/77 | HR 91 | Resp 16 | Ht 61.0 in | Wt 112.8 lb

## 2012-01-17 DIAGNOSIS — I69993 Ataxia following unspecified cerebrovascular disease: Secondary | ICD-10-CM | POA: Insufficient documentation

## 2012-01-17 DIAGNOSIS — I69959 Hemiplegia and hemiparesis following unspecified cerebrovascular disease affecting unspecified side: Secondary | ICD-10-CM | POA: Insufficient documentation

## 2012-01-17 NOTE — Progress Notes (Signed)
Subjective:    Patient ID: Jacqueline King, female    DOB: 24-Jun-1945, 66 y.o.   MRN: [id number]  HPI 66 year old right  handed female with history of uncontrolled hypertension and  hypothyroidism. No medication times years, admitted on July 01, 2011  with dizziness and nausea. The patient was noted to have malignant  hypertension with blood pressure 200/100 as well as nystagmus, ataxia  and nausea. MRI of brain done revealed 9 x 11-mm acute hemorrhage in  right superior vermis with intraventricular extension. Small foci of  acute infarct in right medial thalamus and superior peduncle, and  multiple foci of chronic infarcts and chronic hemorrhage bilaterally. A  2D echo done, revealed mild LVH with EF of 55-65%. Carotid Doppler  showed no ICA stenosis.  Finished PT,OT Walking 15 minutes per day Practicing heel to toe walking Pain Inventory Average Pain 0 Pain Right Now 0 My pain is no pain  In the last 24 hours, has pain interfered with the following? General activity 0 Relation with others 0 Enjoyment of life 0 What TIME of day is your pain at its worst? no pain Sleep (in general) Fair  Pain is worse with: no pain Pain improves with: no pain Relief from Meds: 10  Mobility walk without assistance Do you have any goals in this area?  no  Function Do you have any goals in this area?  no  Neuro/Psych No problems in this area  Prior Studies Any changes since last visit?  no  Physicians involved in your care Any changes since last visit?  no   Family History  Problem Relation Age of Onset  . Diabetes Mother   . Diabetes Sister   . Diabetes Brother    History   Social History  . Marital Status: Widowed    Spouse Name: N/A    Number of Children: N/A  . Years of Education: N/A   Social History Main Topics  . Smoking status: Former Games developer  . Smokeless tobacco: Never Used  . Alcohol Use: No  . Drug Use: No  . Sexually Active: None   Other Topics Concern  .  None   Social History Narrative  . None   Past Surgical History  Procedure Date  . Abdominal hysterectomy   . Cholecystectomy   . Renal artery angioplasty 1993 or 1994   Past Medical History  Diagnosis Date  . Hypertension   . H/O radioactive iodine thyroid ablation   . Stroke    BP 156/77  Pulse 91  Resp 16  Ht 5\' 1"  (1.549 m)  Wt 112 lb 12.8 oz (51.166 kg)  BMI 21.31 kg/m2  SpO2 98%    Review of Systems  All other systems reviewed and are negative.       Objective:   Physical Exam  Constitutional: She is oriented to person, place, and time. She appears well-developed and well-nourished.  Musculoskeletal: Normal range of motion.  Right shoulder: Normal.  Left shoulder: Normal.  Neurological: She is alert and oriented to person, place, and time. She displays no atrophy. No sensory deficit. She exhibits normal muscle tone. Gait abnormal.  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 She has truncal ataxia but no significant limb ataxia. She has dysarthria but  no aphasia  No further formal therapy needed at this time  I'll see her back prn Advised walking every day or at least every other day with her daughter  Practice tandem gait with a cane Please recheck your blood pressure when you get home if the systolic number is greater than 150 please call Dr. Orvan Falconer [place] to drive during daytime hours

## 2012-01-17 NOTE — Patient Instructions (Signed)
Stroke Prevention Some health problems and behaviors may make it more likely for you to have a stroke. Below are ways to lessen your risk of having a stroke.   Be active for at least 30 minutes on most or all days.  Do not smoke. Try not to be around others who smoke (secondhand smoke).  Do not drink too much alcohol.  Eat healthy foods, such as fruits and vegetables. If you were put on a specific diet, follow the diet as told.  Keep your cholesterol levels under control through diet and medicines. Look for foods that are low in saturated fat, trans fat, cholesterol, and are high in fiber.  If you have diabetes, follow all diet plans and take your medicine as told.  If you have high blood pressure (hypertension), follow all diet plans and take your medicine as told.  Keep a healthy weight. Eat foods that are low in calories, salt, saturated fat, trans fat, and cholesterol.  Do not take drugs.  Avoid birth control pills, if this applies. Talk to your doctor about the risks of taking birth control pills.  Talk to your doctor if you have sleep problems (sleep apnea).  Take all medicine as told by your doctor.  You may be told to take aspirin or blood thinner medicine. Take this medicine as told.  Understand your medicine instructions. GET HELP RIGHT AWAY IF:  You lose feeling (numbness) or have weakness in the face, arm, or leg.  You lose feeling or have weakness on one side of the body.  You feel suddenly confused.  You have trouble talking or understanding what people are saying.  You have sudden trouble seeing in one or both eyes.  You have sudden trouble walking.  You are dizzy.  You lose your balance or your movements are clumsy (uncoordinated).  You suddenly have a very bad headache, and you do not know the cause.  You have new chest pain.  Your heart feels like it is fluttering or skipping a beat (irregular heartbeat). Do not wait to see if the symptoms above  go away. Get help right away. Call your local emergency services (911 in [place] Do not drive yourself to the hospital. Document Released: 07/16/2011 Document Reviewed: 09/03/2010 [facility] Patient Information 2013 [place], [place].

## 2012-01-20 ENCOUNTER — Telehealth: Payer: Self-pay | Admitting: Family

## 2012-01-20 NOTE — Telephone Encounter (Signed)
Returned call from patient dispatch. Obtained voice message and left a message to return call if further assistance is needed.

## 2012-01-21 ENCOUNTER — Ambulatory Visit (INDEPENDENT_AMBULATORY_CARE_PROVIDER_SITE_OTHER): Payer: BC Managed Care – PPO | Admitting: Family

## 2012-01-21 ENCOUNTER — Encounter: Payer: Self-pay | Admitting: Family

## 2012-01-21 VITALS — BP 144/90 | HR 76 | Temp 98.6°F | Wt 110.0 lb

## 2012-01-21 DIAGNOSIS — I10 Essential (primary) hypertension: Secondary | ICD-10-CM

## 2012-01-21 DIAGNOSIS — Z8673 Personal history of transient ischemic attack (TIA), and cerebral infarction without residual deficits: Secondary | ICD-10-CM

## 2012-01-21 MED ORDER — METOPROLOL SUCCINATE ER 100 MG PO TB24: 100.0000 mg | ORAL_TABLET | Freq: Every day | ORAL | Status: DC

## 2012-01-21 NOTE — Progress Notes (Signed)
Subjective:    Patient ID: Jacqueline King, female    DOB: July 12, 1945, 66 y.o.   MRN: [id number]  HPI  66 year old [place] American female, nonsmoker is in with her daughter today with complaints of elevated blood pressure. Blood pressure readings at home have been between 138-150 over 80s to 90s. They've grown increasingly worried about her blood pressure over the last several weeks. She denies any lightheadedness, dizziness, chest pain, palpitations, shortness of breath or edema. No recent intake of sodium. She's now exercise and about 20-30 minutes daily, walking.  Review of Systems  Constitutional: Negative.   HENT: Negative.   Respiratory: Negative.   Cardiovascular: Negative.   Gastrointestinal: Negative.   Musculoskeletal: Negative.   Skin: Negative.   Neurological: Negative.   Hematological: Negative.   Psychiatric/Behavioral: Negative.    Past Medical History  Diagnosis Date  . Hypertension   . H/O radioactive iodine thyroid ablation   . Stroke     History   Social History  . Marital Status: Widowed    Spouse Name: N/A    Number of Children: N/A  . Years of Education: N/A   Occupational History  . Not on file.   Social History Main Topics  . Smoking status: Former Games developer  . Smokeless tobacco: Never Used  . Alcohol Use: No  . Drug Use: No  . Sexually Active: Not on file   Other Topics Concern  . Not on file   Social History Narrative  . No narrative on file    Past Surgical History  Procedure Date  . Abdominal hysterectomy   . Cholecystectomy   . Renal artery angioplasty 1993 or 1994    Family History  Problem Relation Age of Onset  . Diabetes Mother   . Diabetes Sister   . Diabetes Brother     Allergies  Allergen Reactions  . Sulfa Drugs Cross Reactors Hives    Current Outpatient Prescriptions on File Prior to Visit  Medication Sig Dispense Refill  . amLODipine (NORVASC) 10 MG tablet Take 1 tablet (10 mg total) by mouth daily.  30 tablet   5  . chlorthalidone (HYGROTON) 25 MG tablet Take 1 tablet (25 mg total) by mouth daily.  30 tablet  5  . levothyroxine (SYNTHROID, LEVOTHROID) 75 MCG tablet Take 1 tablet (75 mcg total) by mouth daily before breakfast.  30 tablet  3  . metoprolol succinate (TOPROL-XL) 100 MG 24 hr tablet Take 1 tablet (100 mg total) by mouth daily.  30 tablet  5    BP 144/90  Pulse 76  Temp 98.6 F (37 C) (Oral)  Wt 110 lb (49.896 kg)chart     Objective:   Physical Exam  Constitutional: She is oriented to person, place, and time. She appears well-developed and well-nourished.  HENT:  Right Ear: External ear normal.  Left Ear: External ear normal.  Nose: Nose normal.  Mouth/Throat: Oropharynx is clear and moist.  Neck: Normal range of motion. Neck supple.  Cardiovascular: Normal rate, regular rhythm and normal heart sounds.   Pulmonary/Chest: Effort normal and breath sounds normal.  Abdominal: Soft. Bowel sounds are normal.  Musculoskeletal: Normal range of motion.  Neurological: She is alert and oriented to person, place, and time.  Skin: Skin is warm and dry.  Psychiatric: She has a normal mood and affect.          Assessment & Plan:  Assessment: Hypertension with elevated blood pressure, history of stroke  Plan: Metoprolol 100 mg once daily. Continue current  medications. Continue exercise. Low sodium diet. Patient call the office with any questions or concerns. Recheck in 2 weeks and sooner as needed.

## 2012-01-21 NOTE — Patient Instructions (Addendum)

## 2012-02-04 ENCOUNTER — Encounter: Payer: Self-pay | Admitting: Family

## 2012-02-04 ENCOUNTER — Ambulatory Visit (INDEPENDENT_AMBULATORY_CARE_PROVIDER_SITE_OTHER): Payer: BC Managed Care – PPO | Admitting: Family

## 2012-02-04 VITALS — BP 146/92 | HR 71 | Temp 98.4°F | Wt 110.0 lb

## 2012-02-04 DIAGNOSIS — I10 Essential (primary) hypertension: Secondary | ICD-10-CM

## 2012-02-04 NOTE — Progress Notes (Signed)
  Subjective:    Patient ID: Jacqueline King, female    DOB: 1945-09-09, 67 y.o.   MRN: [id number]  HPI  67 year old [place] American female, nonsmoker is in for recheck of hypertension. At her last office visit, we increased her Toprol to 100 mg once a day. She stool well. Denies any concerns. Home blood pressures have been 120s to 136 systolically. Denies any lightheadedness, dizziness, headaches, chest pain, palpitations, shortness of breath or edema.  Review of Systems  Constitutional: Negative.   HENT: Negative.   Respiratory: Negative.   Cardiovascular: Negative.   Gastrointestinal: Negative.   Musculoskeletal: Negative.   Skin: Negative.   Neurological: Negative.   Hematological: Negative.   Psychiatric/Behavioral: Negative.    Past Medical History  Diagnosis Date  . Hypertension   . H/O radioactive iodine thyroid ablation   . Stroke     History   Social History  . Marital Status: Widowed    Spouse Name: N/A    Number of Children: N/A  . Years of Education: N/A   Occupational History  . Not on file.   Social History Main Topics  . Smoking status: Former Games developer  . Smokeless tobacco: Never Used  . Alcohol Use: No  . Drug Use: No  . Sexually Active: Not on file   Other Topics Concern  . Not on file   Social History Narrative  . No narrative on file    Past Surgical History  Procedure Date  . Abdominal hysterectomy   . Cholecystectomy   . Renal artery angioplasty 1993 or 1994    Family History  Problem Relation Age of Onset  . Diabetes Mother   . Diabetes Sister   . Diabetes Brother     Allergies  Allergen Reactions  . Sulfa Drugs Cross Reactors Hives    Current Outpatient Prescriptions on File Prior to Visit  Medication Sig Dispense Refill  . amLODipine (NORVASC) 10 MG tablet Take 1 tablet (10 mg total) by mouth daily.  30 tablet  5  . chlorthalidone (HYGROTON) 25 MG tablet Take 1 tablet (25 mg total) by mouth daily.  30 tablet  5  .  levothyroxine (SYNTHROID, LEVOTHROID) 75 MCG tablet Take 1 tablet (75 mcg total) by mouth daily before breakfast.  30 tablet  3  . metoprolol succinate (TOPROL-XL) 100 MG 24 hr tablet Take 1 tablet (100 mg total) by mouth daily.  30 tablet  5    BP 146/92  Pulse 71  Temp 98.4 F (36.9 C) (Oral)  Wt 110 lb (49.896 kg)  SpO2 98%chart    Objective:   Physical Exam  Constitutional: She is oriented to person, place, and time. She appears well-developed and well-nourished.  HENT:  Right Ear: External ear normal.  Nose: Nose normal.  Mouth/Throat: Oropharynx is clear and moist.  Neck: Normal range of motion. Neck supple.  Cardiovascular: Normal rate, regular rhythm and normal heart sounds.   Pulmonary/Chest: Effort normal and breath sounds normal.  Abdominal: Soft. Bowel sounds are normal.  Musculoskeletal: Normal range of motion.  Neurological: She is alert and oriented to person, place, and time.  Skin: Skin is warm and dry.  Psychiatric: She has a normal mood and affect.    Recheck blood pressure 138/88      Assessment & Plan:  Assessment: Hypertension-improved  Plan: Continue current medications. Recheck in 4-5 months and sooner as needed.

## 2012-02-10 ENCOUNTER — Other Ambulatory Visit: Payer: Self-pay

## 2012-02-10 MED ORDER — AMLODIPINE BESYLATE 10 MG PO TABS: 10.0000 mg | ORAL_TABLET | Freq: Every day | ORAL | Status: DC

## 2012-02-10 MED ORDER — CHLORTHALIDONE 25 MG PO TABS: 25.0000 mg | ORAL_TABLET | Freq: Every day | ORAL | Status: DC

## 2012-02-19 ENCOUNTER — Other Ambulatory Visit: Payer: Self-pay

## 2012-02-19 MED ORDER — LEVOTHYROXINE SODIUM 75 MCG PO TABS: 75.0000 ug | ORAL_TABLET | Freq: Every day | ORAL | Status: DC

## 2012-03-16 ENCOUNTER — Ambulatory Visit: Payer: BC Managed Care – PPO | Admitting: Family

## 2012-06-23 ENCOUNTER — Other Ambulatory Visit: Payer: Self-pay

## 2012-06-23 MED ORDER — LEVOTHYROXINE SODIUM 75 MCG PO TABS: 75.0000 ug | ORAL_TABLET | Freq: Every day | ORAL | Status: DC

## 2012-07-06 ENCOUNTER — Encounter: Payer: Self-pay | Admitting: Family

## 2012-07-06 ENCOUNTER — Ambulatory Visit (INDEPENDENT_AMBULATORY_CARE_PROVIDER_SITE_OTHER): Payer: BC Managed Care – PPO | Admitting: Family

## 2012-07-06 VITALS — BP 220/110 | HR 69 | Wt 104.0 lb

## 2012-07-06 DIAGNOSIS — E039 Hypothyroidism, unspecified: Secondary | ICD-10-CM

## 2012-07-06 DIAGNOSIS — Z8673 Personal history of transient ischemic attack (TIA), and cerebral infarction without residual deficits: Secondary | ICD-10-CM

## 2012-07-06 DIAGNOSIS — I10 Essential (primary) hypertension: Secondary | ICD-10-CM

## 2012-07-06 LAB — BASIC METABOLIC PANEL
CO2: 31 mEq/L (ref 19–32)
Calcium: 9.7 mg/dL (ref 8.4–10.5)
Creatinine, Ser: 0.5 mg/dL (ref 0.4–1.2)

## 2012-07-06 LAB — HEPATIC FUNCTION PANEL
Bilirubin, Direct: 0 mg/dL (ref 0.0–0.3)
Total Protein: 7.3 g/dL (ref 6.0–8.3)

## 2012-07-06 LAB — TSH: TSH: 0.35 u[IU]/mL (ref 0.35–5.50)

## 2012-07-06 MED ORDER — LOSARTAN POTASSIUM 100 MG PO TABS: 100.0000 mg | ORAL_TABLET | Freq: Every day | ORAL | Status: DC

## 2012-07-06 MED ORDER — POTASSIUM CHLORIDE CRYS ER 20 MEQ PO TBCR: 20.0000 meq | EXTENDED_RELEASE_TABLET | Freq: Every day | ORAL | Status: DC

## 2012-07-06 NOTE — Progress Notes (Signed)
Subjective:    Patient ID: Jacqueline King, female    DOB: [birth year], 67 y.o.   MRN: [id number]  HPI 67 year old African American female, nonsmoker is in for recheck of hypertension, hypothyroidism, history of CVA. She's currently doing well on medications. Denies any concerns today.family history significant for malignant hypertension   Review of Systems  Constitutional: Negative.   HENT: Negative.   Respiratory: Negative.   Cardiovascular: Negative.  Negative for chest pain, palpitations and leg swelling.  Gastrointestinal: Negative.   Endocrine: Negative.  Negative for polydipsia, polyphagia and polyuria.  Genitourinary: Negative.   Musculoskeletal: Negative.   Skin: Negative.   Allergic/Immunologic: Negative.   Neurological: Negative.   Hematological: Negative.   Psychiatric/Behavioral: Negative.    Past Medical History  Diagnosis Date  . Hypertension   . H/O radioactive iodine thyroid ablation   . Stroke     History   Social History  . Marital Status: Widowed    Spouse Name: N/A    Number of Children: N/A  . Years of Education: N/A   Occupational History  . Not on file.   Social History Main Topics  . Smoking status: Former Games developer  . Smokeless tobacco: Never Used  . Alcohol Use: No  . Drug Use: No  . Sexually Active: Not on file   Other Topics Concern  . Not on file   Social History Narrative  . No narrative on file    Past Surgical History  Procedure Laterality Date  . Abdominal hysterectomy    . Cholecystectomy    . Renal artery angioplasty  1993 or 1994    Family History  Problem Relation Age of Onset  . Diabetes Mother   . Diabetes Sister   . Diabetes Brother     Allergies  Allergen Reactions  . Sulfa Drugs Cross Reactors Hives    Current Outpatient Prescriptions on File Prior to Visit  Medication Sig Dispense Refill  . amLODipine (NORVASC) 10 MG tablet Take 1 tablet (10 mg total) by mouth daily.  30 tablet  5  . chlorthalidone  (HYGROTON) 25 MG tablet Take 1 tablet (25 mg total) by mouth daily.  30 tablet  5  . levothyroxine (SYNTHROID, LEVOTHROID) 75 MCG tablet Take 1 tablet (75 mcg total) by mouth daily before breakfast.  30 tablet  3  . metoprolol succinate (TOPROL-XL) 100 MG 24 hr tablet Take 1 tablet (100 mg total) by mouth daily.  30 tablet  5   No current facility-administered medications on file prior to visit.    BP 220/110  Pulse 69  Wt 104 lb (47.174 kg)  BMI 19.66 kg/m2  SpO2 96%chart    Objective:   Physical Exam  Constitutional: She is oriented to person, place, and time. She appears well-developed and well-nourished.  HENT:  Right Ear: External ear normal.  Left Ear: External ear normal.  Nose: Nose normal.  Mouth/Throat: Oropharynx is clear and moist.  Neck: Normal range of motion. Neck supple. No thyromegaly present.  Cardiovascular: Normal rate, regular rhythm and normal heart sounds.   1050: Recheck blood pressure 200/110, 0.2 mg of clonidine given by mouth x1. 11:07am: 180/98. 11:30am 140/90  Pulmonary/Chest: Effort normal and breath sounds normal.  1050: Recheck blood pressure 200/110, 0.2 mg of clonidine given by mouth x1. 11:07am: 180/98  Abdominal: Soft. Bowel sounds are normal.  Musculoskeletal: Normal range of motion.  Neurological: She is alert and oriented to person, place, and time.  Skin: Skin is warm and dry.  Psychiatric: She  has a normal mood and affect.          Assessment & Plan:  Assessment:  1. Hypertension-uncontrolled 2. History of CVA 3. Hypothyroidism  Plan: Add Cozaar 100 mg once daily. Recheck blood pressure here in the office in 2 weeks. Lab sent to include BMP, liver function tests, TSH notify patient pending results. Encouraged healthy diet and exercise. Low sodium diet.

## 2012-07-06 NOTE — Patient Instructions (Addendum)

## 2012-07-20 ENCOUNTER — Encounter: Payer: Self-pay | Admitting: Family

## 2012-07-20 ENCOUNTER — Ambulatory Visit (INDEPENDENT_AMBULATORY_CARE_PROVIDER_SITE_OTHER): Payer: BC Managed Care – PPO | Admitting: Family

## 2012-07-20 VITALS — BP 162/98 | HR 84 | Wt 104.0 lb

## 2012-07-20 DIAGNOSIS — E876 Hypokalemia: Secondary | ICD-10-CM

## 2012-07-20 DIAGNOSIS — I10 Essential (primary) hypertension: Secondary | ICD-10-CM

## 2012-07-20 MED ORDER — LOSARTAN POTASSIUM-HCTZ 100-25 MG PO TABS: 1.0000 | ORAL_TABLET | Freq: Every day | ORAL | Status: DC

## 2012-07-20 NOTE — Progress Notes (Signed)
Subjective:    Patient ID: Jacqueline King, female    DOB: July 29, 1945, 67 y.o.   MRN: [id number]  HPI 67 year old [place] American female, nonsmoker is in for recheck of hypertension. At her last visit she was found to have a blood pressure of 220/110. She is currently taking Cozaar 100 mg once daily, Norvasc 10 mg, and metoprolol 100 mg. She's tolerating her medications well. She takes potassium daily. He is attempting to watch the sodium intake in her diet. She also has a history of hypothyroidism, history of stroke and cerebral hemorrhage.   Review of Systems  Constitutional: Negative.   HENT: Negative.   Respiratory: Negative.   Cardiovascular: Negative.   Gastrointestinal: Negative.   Endocrine: Negative.   Musculoskeletal: Negative.   Skin: Negative.   Neurological: Negative.   Hematological: Negative.   Psychiatric/Behavioral: Negative.    Past Medical History  Diagnosis Date  . Hypertension   . H/O radioactive iodine thyroid ablation   . Stroke     History   Social History  . Marital Status: Widowed    Spouse Name: N/A    Number of Children: N/A  . Years of Education: N/A   Occupational History  . Not on file.   Social History Main Topics  . Smoking status: Former Games developer  . Smokeless tobacco: Never Used  . Alcohol Use: No  . Drug Use: No  . Sexually Active: Not on file   Other Topics Concern  . Not on file   Social History Narrative  . No narrative on file    Past Surgical History  Procedure Laterality Date  . Abdominal hysterectomy    . Cholecystectomy    . Renal artery angioplasty  1993 or 1994    Family History  Problem Relation Age of Onset  . Diabetes Mother   . Diabetes Sister   . Diabetes Brother     Allergies  Allergen Reactions  . Sulfa Drugs Cross Reactors Hives    Current Outpatient Prescriptions on File Prior to Visit  Medication Sig Dispense Refill  . amLODipine (NORVASC) 10 MG tablet Take 1 tablet (10 mg total) by mouth  daily.  30 tablet  5  . levothyroxine (SYNTHROID, LEVOTHROID) 75 MCG tablet Take 1 tablet (75 mcg total) by mouth daily before breakfast.  30 tablet  3  . metoprolol succinate (TOPROL-XL) 100 MG 24 hr tablet Take 1 tablet (100 mg total) by mouth daily.  30 tablet  5  . potassium chloride SA (K-DUR,KLOR-CON) 20 MEQ tablet Take 1 tablet (20 mEq total) by mouth daily.  30 tablet  3   No current facility-administered medications on file prior to visit.    BP 162/98  Pulse 84  Wt 104 lb (47.174 kg)  BMI 19.66 kg/m2  SpO2 96%chart    Objective:   Physical Exam  Constitutional: She is oriented to person, place, and time. She appears well-developed and well-nourished.  Neck: Normal range of motion. Neck supple.  Cardiovascular: Normal rate, regular rhythm and normal heart sounds.   Recheck blood pressure 160/98  Pulmonary/Chest: Effort normal and breath sounds normal.  Abdominal: Soft. Bowel sounds are normal.  Musculoskeletal: Normal range of motion.  Neurological: She is alert and oriented to person, place, and time.  Skin: Skin is warm and dry.  Psychiatric: She has a normal mood and affect.          Assessment & Plan:  Assessment:  1. Hypertension-uncontrolled 2. Hypothyroidism 3. Hypokalemia  Plan: DC Cozaar. Start Hyzaar  100/25 once daily. Encourage potassium supplement daily. Continue current medications. DC chlorthalidone.

## 2012-07-22 ENCOUNTER — Telehealth: Payer: Self-pay | Admitting: Family

## 2012-07-22 NOTE — Telephone Encounter (Signed)
Pharm needs clarification on RX. losartan-hydrochlorothiazide (HYZAAR) 100-25 MG per tablet. Pt states she is lonely to take  Losartan by itself. Pls advise

## 2012-07-22 NOTE — Telephone Encounter (Signed)
Pharmacy aware pt's medication was switched to [facility]

## 2012-08-03 ENCOUNTER — Ambulatory Visit (INDEPENDENT_AMBULATORY_CARE_PROVIDER_SITE_OTHER): Payer: BC Managed Care – PPO | Admitting: Family

## 2012-08-03 ENCOUNTER — Encounter: Payer: Self-pay | Admitting: Family

## 2012-08-03 VITALS — BP 138/90 | HR 78 | Wt 104.0 lb

## 2012-08-03 DIAGNOSIS — I10 Essential (primary) hypertension: Secondary | ICD-10-CM

## 2012-08-03 NOTE — Progress Notes (Signed)
Subjective:    Patient ID: Jacqueline King, female    DOB: 12-12-1945, 67 y.o.   MRN: [id number]  HPI 67 year old [place] American female, nonsmoker, is in for recheck of hypertension. At her last office visit we increased her blood pressure medicine. She's currently on Norvasc 10 mg, Hyzaar 100/25 once daily, and Toprol-XL 100 mg daily. She's tolerating her medications well.   Review of Systems  Constitutional: Negative.   HENT: Negative.   Respiratory: Negative.   Cardiovascular: Negative.   Gastrointestinal: Negative.   Endocrine: Negative.   Genitourinary: Negative.   Musculoskeletal: Negative.   Skin: Negative.   Neurological: Negative.   Psychiatric/Behavioral: Negative.    Past Medical History  Diagnosis Date  . Hypertension   . H/O radioactive iodine thyroid ablation   . Stroke     History   Social History  . Marital Status: Widowed    Spouse Name: N/A    Number of Children: N/A  . Years of Education: N/A   Occupational History  . Not on file.   Social History Main Topics  . Smoking status: Former Games developer  . Smokeless tobacco: Never Used  . Alcohol Use: No  . Drug Use: No  . Sexually Active: Not on file   Other Topics Concern  . Not on file   Social History Narrative  . No narrative on file    Past Surgical History  Procedure Laterality Date  . Abdominal hysterectomy    . Cholecystectomy    . Renal artery angioplasty  1993 or 1994    Family History  Problem Relation Age of Onset  . Diabetes Mother   . Diabetes Sister   . Diabetes Brother     Allergies  Allergen Reactions  . Sulfa Drugs Cross Reactors Hives    Current Outpatient Prescriptions on File Prior to Visit  Medication Sig Dispense Refill  . amLODipine (NORVASC) 10 MG tablet Take 1 tablet (10 mg total) by mouth daily.  30 tablet  5  . levothyroxine (SYNTHROID, LEVOTHROID) 75 MCG tablet Take 1 tablet (75 mcg total) by mouth daily before breakfast.  30 tablet  3  .  losartan-hydrochlorothiazide (HYZAAR) 100-25 MG per tablet Take 1 tablet by mouth daily.  30 tablet  3  . potassium chloride SA (K-DUR,KLOR-CON) 20 MEQ tablet Take 1 tablet (20 mEq total) by mouth daily.  30 tablet  3  . metoprolol succinate (TOPROL-XL) 100 MG 24 hr tablet Take 1 tablet (100 mg total) by mouth daily.  30 tablet  5   No current facility-administered medications on file prior to visit.    BP 138/90  Pulse 78  Wt 104 lb (47.174 kg)  BMI 19.66 kg/m2  SpO2 98%chart    Objective:   Physical Exam  Constitutional: She is oriented to person, place, and time. She appears well-developed and well-nourished.  HENT:  Right Ear: External ear normal.  Left Ear: External ear normal.  Nose: Nose normal.  Mouth/Throat: Oropharynx is clear and moist.  Neck: Normal range of motion. Neck supple.  Cardiovascular: Normal rate, regular rhythm and normal heart sounds.   Pulmonary/Chest: Effort normal and breath sounds normal.  Abdominal: Soft. Bowel sounds are normal.  Musculoskeletal: Normal range of motion.  Neurological: She is alert and oriented to person, place, and time.  Skin: Skin is warm and dry.  Psychiatric: She has a normal mood and affect.          Assessment & Plan:  Assessment: 1. Hypertension 2.Hypothyroidism 3. Hypokalemia  Plan: Continue current medications. He appears healthy diet and exercise. Follow with patient in 4 months and sooner as needed.

## 2012-08-11 ENCOUNTER — Encounter: Payer: Self-pay | Admitting: Neurology

## 2012-08-12 ENCOUNTER — Encounter: Payer: Self-pay | Admitting: Neurology

## 2012-08-12 ENCOUNTER — Ambulatory Visit (INDEPENDENT_AMBULATORY_CARE_PROVIDER_SITE_OTHER): Payer: Medicare Other | Admitting: Neurology

## 2012-08-12 VITALS — BP 128/79 | HR 68 | Temp 98.5°F | Ht 60.0 in | Wt 102.5 lb

## 2012-08-12 DIAGNOSIS — I6350 Cerebral infarction due to unspecified occlusion or stenosis of unspecified cerebral artery: Secondary | ICD-10-CM

## 2012-08-12 NOTE — Progress Notes (Signed)
[facility] [address] [place]. [place] [address] [phone]       OFFICE FOLLOW-UP NOTE  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 08/12/2012 she returns for followup today after last visit on 12/23/11. Continues to do well without recurrence stroke or TIA symptoms. She states her blood pressure control fluctuates and she's had several medications added and removed by her primary physician. She feels overall she is doing better than before. She is currently on 3 different medications Norvasc, Hyzaar and Toprol. She has no new complaints today. ROS:   14 system review of systems is positive for no complaints  PMH:  Past Medical History  Diagnosis Date  . Hypertension   . H/O radioactive iodine thyroid ablation   . Stroke     Social History:  History   Social History  . Marital Status: Widowed    Spouse Name: N/A    Number of Children: 4  . Years of Education: 61yrs   Occupational History  .  [employer]   Social History Main Topics  . Smoking status: Former Smoker -- 0.50 packs/day for 7 years    Types: Cigarettes    Quit date: 01/29/1972  . Smokeless tobacco: Former Neurosurgeon    Types: Snuff, Chew     Comment: as a teenager  . Alcohol Use: No  . Drug Use: No  . Sexually Active: Not on file   Other Topics Concern  . 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    Patient denies taking all illict drugs.   Patient lives at home alone.     Medications:   Current Outpatient Prescriptions on File Prior to Visit  Medication Sig Dispense Refill  . amLODipine (NORVASC) 10 MG tablet Take 1 tablet (10 mg total) by mouth daily.  30 tablet  5  . levothyroxine (SYNTHROID, LEVOTHROID) 75 MCG tablet Take  1 tablet (75 mcg total) by mouth daily before breakfast.  30 tablet  3  . losartan-hydrochlorothiazide (HYZAAR) 100-25 MG per tablet Take 1 tablet by mouth daily.  30 tablet  3  . metoprolol succinate (TOPROL-XL) 100 MG 24 hr tablet Take 1 tablet (100 mg total) by mouth daily.  30 tablet  5  . potassium chloride SA (K-DUR,KLOR-CON) 20 MEQ tablet Take 1 tablet (20 mEq total) by mouth daily.  30 tablet  3   No current facility-administered medications on file prior to visit.    Allergies:   Allergies  Allergen Reactions  . Sulfa Drugs Cross Reactors Hives   Filed Vitals:   08/12/12 1529  BP: 128/79  Pulse: 68  Temp: 98.5 F (36.9 C)     Physical Exam General: Frail elderly African American lady seated, in no evident distress Head: head normocephalic and atraumatic. Orohparynx benign Neck: supple with no carotid or supraclavicular bruits Cardiovascular: regular rate and rhythm, no murmurs Musculoskeletal: no deformity Skin:  no rash/petichiae Vascular:  Normal pulses all extremities  Neurologic Exam Mental Status: Awake and fully alert. Oriented to place and time. Recent and remote memory intact. Attention span, concentration and fund of knowledge appropriate. Mood and affect appropriate.  Cranial Nerves: Fundoscopic exam rot done   Pupils equal, briskly reactive to light. Extraocular movements full  without nystagmus. Visual fields full to confrontation. Hearing intact. Facial sensation intact. Face, tongue, palate moves normally and symmetrically.  Motor: Normal bulk and tone. Normal strength in all tested extremity muscles. Sensory.: intact to tough and pinprick and vibratory.  Coordination: Rapid alternating movements normal in all extremities. Finger-to-nose and heel-to-shin performed accurately bilaterally. Gait and Station: Arises from chair without difficulty. Stance is normal. Gait demonstrates normal stride length and balance . Not able to heel, toe and tandem walk without  difficulty.  Reflexes: 1+ and symmetric. Toes downgoing.     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 She is doing well with better control of hypertension and no residual deficits.    PLAN: Continue aggressive blood pressure control with blood pressure goal below 130/90 and lipid control with LDL goal below 100 mg percent. Continue aspirin 81 mg daily. Check followup carotid ultrasound study. Return for followup in 6 months with Jacqueline Guile, NP

## 2012-08-12 NOTE — Patient Instructions (Addendum)
Continue aggressive blood pressure control with blood pressure goal below 130/90 and lipid control with LDL goal below 100 mg percent. Continue aspirin 81 mg daily. Check followup carotid ultrasound study. Return for followup in 6 months with Heide Guile, NP

## 2012-08-27 ENCOUNTER — Ambulatory Visit (INDEPENDENT_AMBULATORY_CARE_PROVIDER_SITE_OTHER): Payer: BC Managed Care – PPO

## 2012-08-27 DIAGNOSIS — I6350 Cerebral infarction due to unspecified occlusion or stenosis of unspecified cerebral artery: Secondary | ICD-10-CM

## 2012-09-05 ENCOUNTER — Other Ambulatory Visit: Payer: Self-pay | Admitting: Family

## 2012-11-03 ENCOUNTER — Other Ambulatory Visit: Payer: Self-pay | Admitting: Family

## 2012-11-16 ENCOUNTER — Other Ambulatory Visit: Payer: Self-pay | Admitting: Family

## 2012-12-01 ENCOUNTER — Other Ambulatory Visit: Payer: Self-pay | Admitting: Family

## 2012-12-03 ENCOUNTER — Other Ambulatory Visit: Payer: Self-pay | Admitting: Family

## 2012-12-07 ENCOUNTER — Ambulatory Visit (INDEPENDENT_AMBULATORY_CARE_PROVIDER_SITE_OTHER): Payer: Medicare Other | Admitting: Family

## 2012-12-07 ENCOUNTER — Encounter: Payer: Self-pay | Admitting: Family

## 2012-12-07 VITALS — BP 120/84 | HR 95 | Temp 97.5°F | Wt 105.0 lb

## 2012-12-07 DIAGNOSIS — I10 Essential (primary) hypertension: Secondary | ICD-10-CM

## 2012-12-07 DIAGNOSIS — E876 Hypokalemia: Secondary | ICD-10-CM

## 2012-12-07 DIAGNOSIS — Z8673 Personal history of transient ischemic attack (TIA), and cerebral infarction without residual deficits: Secondary | ICD-10-CM

## 2012-12-07 DIAGNOSIS — E039 Hypothyroidism, unspecified: Secondary | ICD-10-CM

## 2012-12-07 NOTE — Progress Notes (Signed)
Subjective:    Patient ID: Jacqueline King, female    DOB: 11-26-1945, 67 y.o.   MRN: [id number]  HPI  67 year old American female, nonsmoker is in for recheck of hypertension, hypothyroidism, and history of cerebrovascular accident. She reports she is doing very well. Denies any concerns. Tolerating meds well.  Review of Systems  Constitutional: Negative.   HENT: Negative.   Respiratory: Negative.   Cardiovascular: Negative.   Gastrointestinal: Negative.   Endocrine: Negative.   Genitourinary: Negative.   Musculoskeletal: Negative.   Skin: Negative.   Allergic/Immunologic: Negative.   Neurological: Negative.   Hematological: Negative.   Psychiatric/Behavioral: Negative.    Past Medical History  Diagnosis Date  . Hypertension   . H/O radioactive iodine thyroid ablation   . Stroke     History   Social History  . Marital Status: Widowed    Spouse Name: N/A    Number of Children: 4  . Years of Education: 53yrs   Occupational History  .  [employer]   Social History Main Topics  . Smoking status: Former Smoker -- 0.50 packs/day for 7 years    Types: Cigarettes    Quit date: 01/29/1972  . Smokeless tobacco: Former Neurosurgeon    Types: Snuff, Chew     Comment: as a teenager  . Alcohol Use: No  . Drug Use: No  . Sexual Activity: Not on file   Other Topics Concern  . 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    Patient denies taking all illict drugs.   Patient lives at home alone.     Past Surgical History  Procedure Laterality Date  . Abdominal hysterectomy    . Cholecystectomy    . Renal artery angioplasty  1993 or 1994    Family History  Problem Relation Age of Onset  . Diabetes Mother   . Thyroid disease Mother   . Diabetes Sister   . Graves' disease Sister   . Thyroid disease Sister   . Diabetes Brother   . Hypertension      Allergies  Allergen Reactions  . Sulfa Drugs Cross  Reactors Hives    Current Outpatient Prescriptions on File Prior to Visit  Medication Sig Dispense Refill  . amLODipine (NORVASC) 10 MG tablet TAKE 1 TABLET BY MOUTH EVERY DAY  90 tablet  0  . levothyroxine (SYNTHROID, LEVOTHROID) 75 MCG tablet TAKE 1 TABLET BY MOUTH DAILY BEFORE BREAKFAST  90 tablet  0  . losartan-hydrochlorothiazide (HYZAAR) 100-25 MG per tablet TAKE 1 TABLET BY MOUTH DAILY  90 tablet  0  . metoprolol succinate (TOPROL-XL) 100 MG 24 hr tablet Take 1 tablet (100 mg total) by mouth daily.  30 tablet  5  . potassium chloride SA (K-DUR,KLOR-CON) 20 MEQ tablet TAKE 1 TABLET BY MOUTH EVERY DAY  90 tablet  0   No current facility-administered medications on file prior to visit.    BP 120/84  Pulse 95  Temp(Src) 97.5 F (36.4 C) (Oral)  Wt 105 lb (47.628 kg)  SpO2 99%chart    Objective:   Physical Exam  Constitutional: She is oriented to person, place, and time. She appears well-developed and well-nourished.  HENT:  Right Ear: External ear normal.  Left Ear: External ear normal.  Nose: Nose normal.  Mouth/Throat: Oropharynx is clear and moist.  Neck: Normal range of motion. Neck supple.  Cardiovascular: Normal rate, regular rhythm  and normal heart sounds.   Pulmonary/Chest: Effort normal and breath sounds normal.  Abdominal: Soft. Bowel sounds are normal.  Neurological: She is alert and oriented to person, place, and time.  Skin: Skin is warm and dry.  Psychiatric: She has a normal mood and affect.          Assessment & Plan:  Assessment: 1. Hypertension 2. Hypothyroidism 3. History of CVA  Plan: Continue current medications. Labs sent notify patient of the results. Follow with neurology as scheduled. Followup here for complete physical exam in 6 months and sooner as needed.

## 2012-12-07 NOTE — Progress Notes (Signed)
Pre visit review using our clinic review tool, if applicable. No additional management support is needed unless otherwise documented below in the visit note. 

## 2013-02-12 ENCOUNTER — Encounter (INDEPENDENT_AMBULATORY_CARE_PROVIDER_SITE_OTHER): Payer: Self-pay

## 2013-02-12 ENCOUNTER — Encounter: Payer: Self-pay | Admitting: Nurse Practitioner

## 2013-02-12 ENCOUNTER — Ambulatory Visit (INDEPENDENT_AMBULATORY_CARE_PROVIDER_SITE_OTHER): Payer: Medicare Other | Admitting: Nurse Practitioner

## 2013-02-12 VITALS — BP 189/87 | HR 88 | Ht 60.6 in | Wt 106.0 lb

## 2013-02-12 DIAGNOSIS — I6350 Cerebral infarction due to unspecified occlusion or stenosis of unspecified cerebral artery: Secondary | ICD-10-CM

## 2013-02-12 MED ORDER — ASPIRIN EC 81 MG PO TBEC: 81.0000 mg | DELAYED_RELEASE_TABLET | Freq: Every day | ORAL | Status: AC

## 2013-02-12 NOTE — Patient Instructions (Signed)
PLAN:  Continue aggressive blood pressure control with blood pressure goal below 130/90 and lipid control with LDL goal below 100 mg percent. Continue aspirin 81 mg daily.  Return for followup in 6 months with Heide GuileLynn Lam, NP

## 2013-02-12 NOTE — Progress Notes (Signed)
PATIENT: Jacqueline King DOB: [birth year]   REASON FOR VISIT: follow up for stroke HISTORY FROM: patient  HISTORY OF PRESENT ILLNESS: 68 year old lady with hemorrhagic cerebellar infarct as well as small bilateral lacunar infarcts in June 2013 secondary to malignant hypertension.   08/12/2012 (PS): she returns for followup today after last visit on 12/23/11. Continues to do well without recurrence stroke or TIA symptoms. She states her blood pressure control fluctuates and she's had several medications added and removed by her primary physician. She feels overall she is doing better than before. She is currently on 3 different medications Norvasc, Hyzaar and Toprol. She has no new complaints today.   02/12/13 (LL):  she returns for followup today after last visit on 08/12/12.  She reports that she is doing very well and has had no recurrent stroke symptoms.  Her bp she states is now well controlled, although she forgot to take her medication this morning and it is 189/87 in office.  She has no complaints and is tolerating aspirin daily well without significant bruising.  ROS:  14 system review of systems is positive for no complaints  ALLERGIES: Allergies  Allergen Reactions  . Sulfa Drugs Cross Reactors Hives    HOME MEDICATIONS: Outpatient Prescriptions Prior to Visit  Medication Sig Dispense Refill  . amLODipine (NORVASC) 10 MG tablet TAKE 1 TABLET BY MOUTH EVERY DAY  90 tablet  0  . levothyroxine (SYNTHROID, LEVOTHROID) 75 MCG tablet TAKE 1 TABLET BY MOUTH DAILY BEFORE BREAKFAST  90 tablet  0  . losartan-hydrochlorothiazide (HYZAAR) 100-25 MG per tablet TAKE 1 TABLET BY MOUTH DAILY  90 tablet  0  . potassium chloride SA (K-DUR,KLOR-CON) 20 MEQ tablet TAKE 1 TABLET BY MOUTH EVERY DAY  90 tablet  0  . metoprolol succinate (TOPROL-XL) 100 MG 24 hr tablet Take 1 tablet (100 mg total) by mouth daily.  30 tablet  5   No facility-administered medications prior to visit.    PAST MEDICAL  HISTORY: Past Medical History  Diagnosis Date  . Hypertension   . H/O radioactive iodine thyroid ablation   . Stroke     PAST SURGICAL HISTORY: Past Surgical History  Procedure Laterality Date  . Abdominal hysterectomy    . Cholecystectomy    . Renal artery angioplasty  1993 or 1994    FAMILY HISTORY: Family History  Problem Relation Age of Onset  . Diabetes Mother   . Thyroid disease Mother   . Diabetes Sister   . Graves' disease Sister   . Thyroid disease Sister   . Diabetes Brother   . Hypertension      SOCIAL HISTORY: History   Social History  . Marital Status: Widowed    Spouse Name: N/A    Number of Children: 4  . Years of Education: 63yrs   Occupational History  . Retired  [employer]   Social History Main Topics  . Smoking status: Former Smoker -- 0.50 packs/day for 7 years    Types: Cigarettes    Quit date: 01/29/1972  . Smokeless tobacco: Former Neurosurgeon    Types: Snuff, Chew     Comment: as a teenager  . Alcohol Use: No  . Drug Use: No  . Sexual Activity: Not on file   Other Topics Concern  . 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    Patient denies taking all illict drugs.  Patient lives at home alone.     PHYSICAL EXAM  Filed Vitals:   02/12/13 1353  BP: 189/87  Pulse: 88  Height: 5' 0.6" (1.539 m)  Weight: 106 lb (48.081 kg)   Body mass index is 20.3 kg/(m^2).  General: Frail elderly African American lady seated, in no evident distress  Head: head normocephalic and atraumatic. Orohparynx benign  Neck: supple with no carotid or supraclavicular bruits  Cardiovascular: regular rate and rhythm, no murmurs  Musculoskeletal: no deformity  Skin: no rash/petichiae  Vascular: Normal pulses all extremities   Neurologic Exam  Mental Status: Awake and fully alert. Oriented to place and time. Recent and remote memory intact. Attention span, concentration and fund of  knowledge appropriate. Mood and affect appropriate.  Cranial Nerves: Pupils equal, briskly reactive to light. Extraocular movements full without nystagmus. Visual fields full to confrontation. Hearing intact. Facial sensation intact. Face, tongue, palate moves normally and symmetrically.  Motor: Normal bulk and tone. Normal strength in all tested extremity muscles.  Sensory: intact to touch and pinprick and vibratory.  Coordination: Rapid alternating movements normal in all extremities. Finger-to-nose and heel-to-shin performed accurately bilaterally.  Gait and Station: Arises from chair without difficulty. Stance is normal. Gait demonstrates normal stride length and balance . Not able to heel, toe and tandem walk without difficulty.  Reflexes: 1+ and symmetric. Toes downgoing.   DIAGNOSTIC DATA (LABS, IMAGING, TESTING) - I reviewed patient records, labs, notes, testing and imaging myself where available.  08/27/12 Carotid Dopplers - study showed an abnormally elevated BFV seen in the mid ICA and consistent with 50-69% stenosis of this vessel segment but this finding was not supported by the presence of plaques. There were mild wall thickening is presented in the bilateral CCAs.  ASSESSMENT AND PLAN 68 year old lady with hemorrhagic cerebellar infarct as well as small bilateral lacunar infarcts in June 2013 secondary to malignant hypertension. She is doing well with better control of hypertension and no residual deficits.   PLAN:  Continue aggressive blood pressure control with blood pressure goal below 130/90 and lipid control with LDL goal below 100 mg percent. Continue aspirin 81 mg daily.  Return for followup in 6 months with Heide GuileLynn Lam, NP   Tawny AsalLYNN E. LAM, MSN, NP-C 02/12/2013, 2:12 PM [facility] [address], [address] [place], [place] [address] [phone]  Note: This document was prepared with digital dictation and possible smart phrase technology. Any transcriptional  errors that result from this process are unintentional.

## 2013-02-14 IMAGING — CT CT HEAD W/O CM
1 of 2 series · 13 of 30 positions shown, 17 images · non-contrast
Comparison: 07/01/2011 MR brain and CT.

CLINICAL DATA: Intracranial hemorrhage follow-up.

CT HEAD WITHOUT CONTRAST
TECHNIQUE: Contiguous axial images were obtained from the base of
the skull through the vertex without contrast.

[Series 2: brain · axial · 0.47mm/px · z∈[+116,+251]mm · 13 of 32 slices shown, 17 images]
[im 3/32  brain]
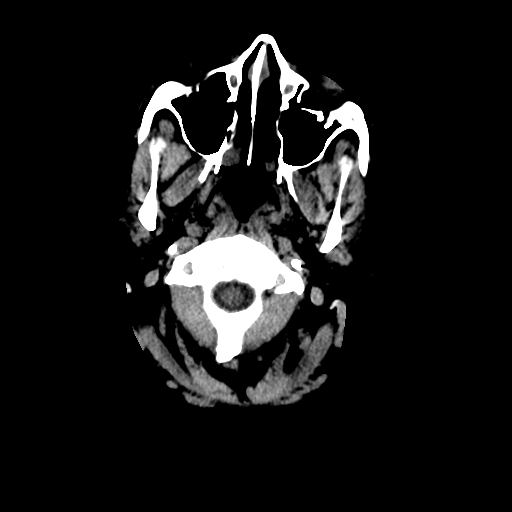
[im 3/32  bone]
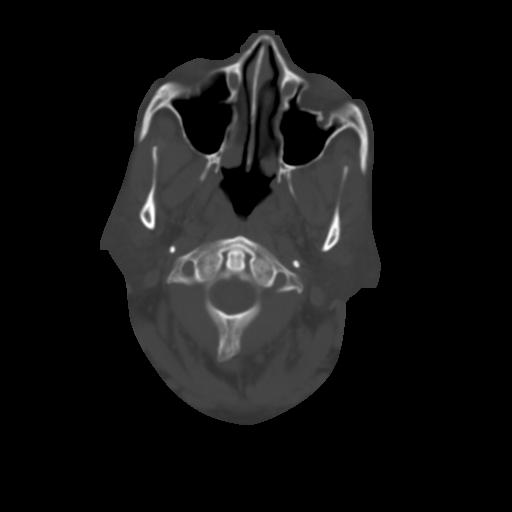
[im 5/32  brain]
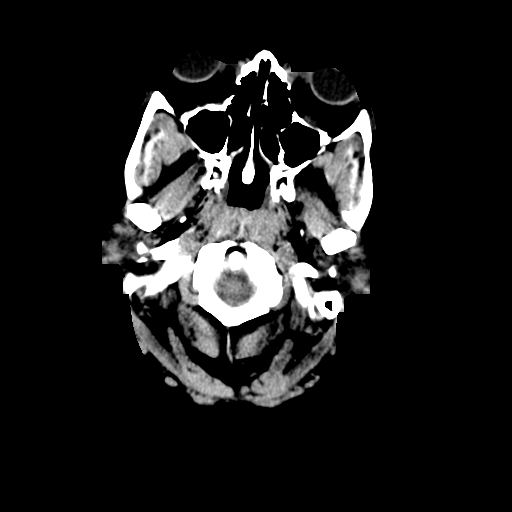
[im 7/32  brain]
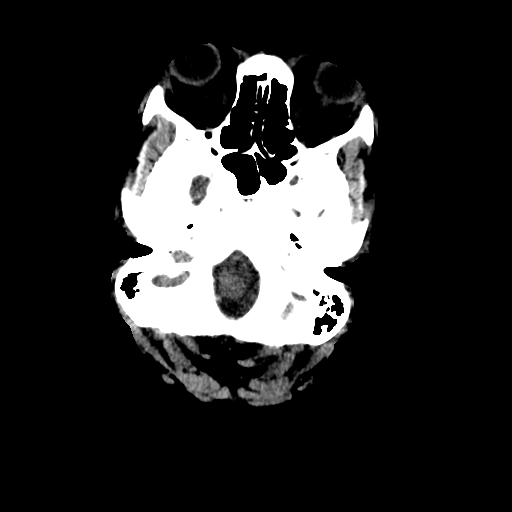
[im 9/32  brain]
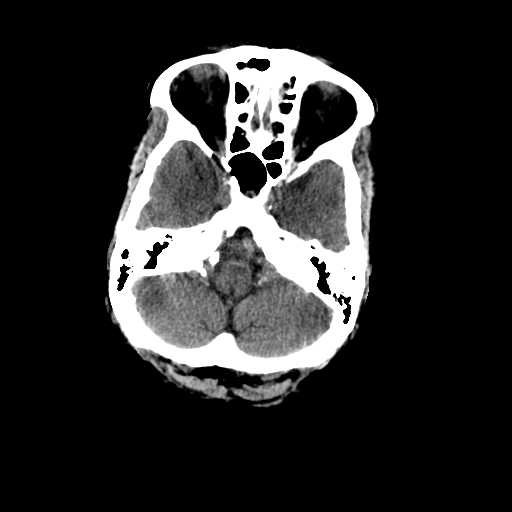
[im 12/32  brain]
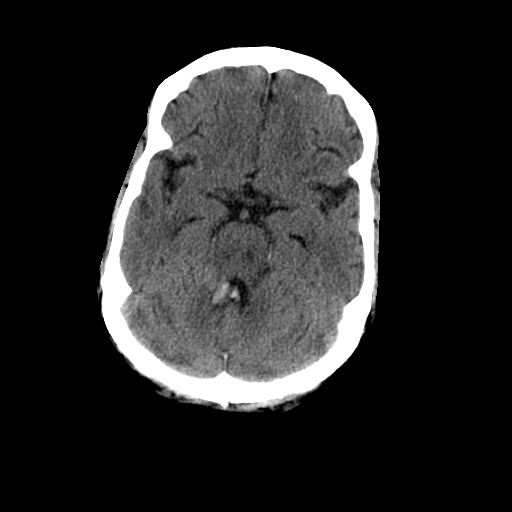
[im 12/32  bone]
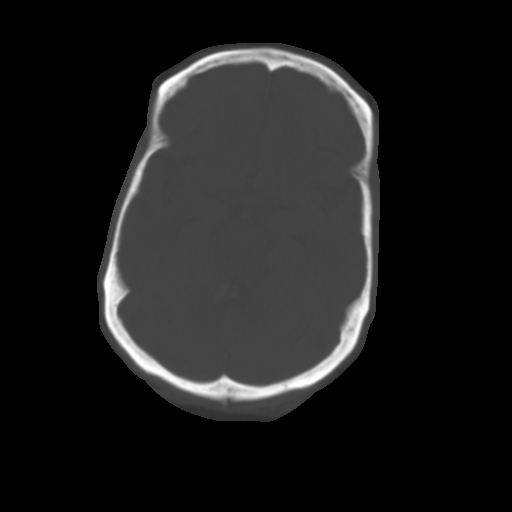
[im 14/32  brain]
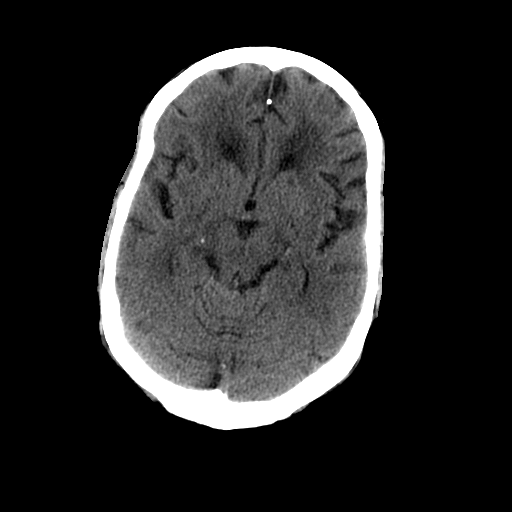
[im 16/32  brain]
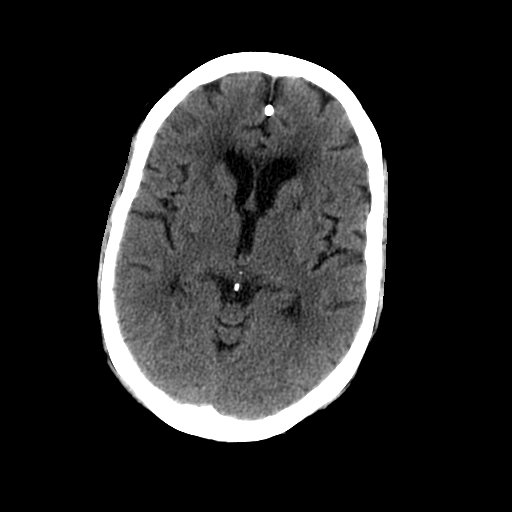
[im 18/32  brain]
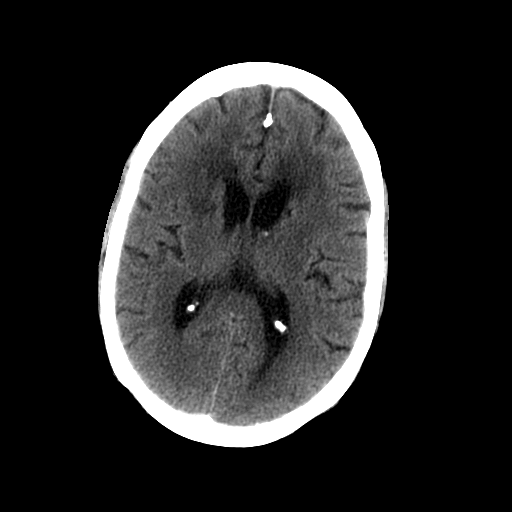
[im 20/32  brain]
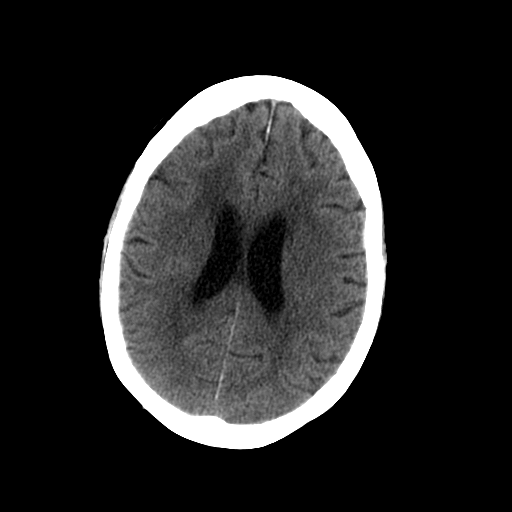
[im 20/32  bone]
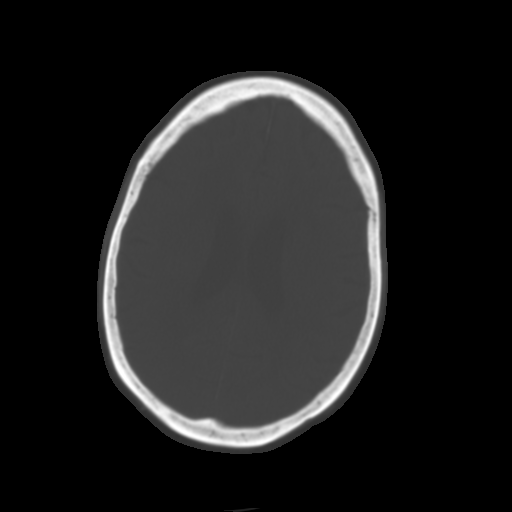
[im 23/32  brain]
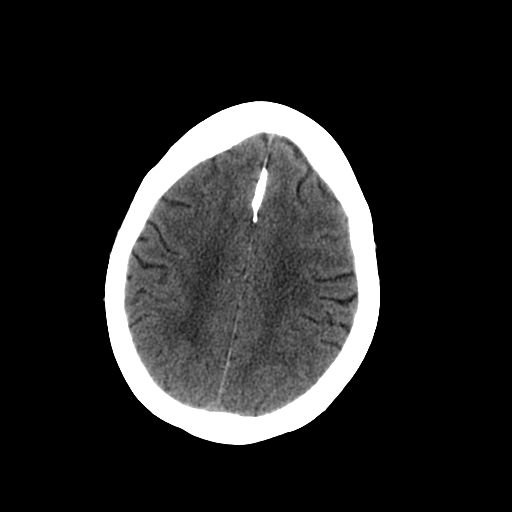
[im 25/32  brain]
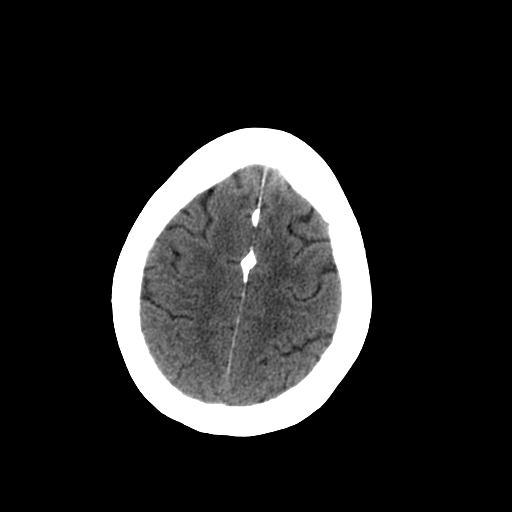
[im 27/32  brain]
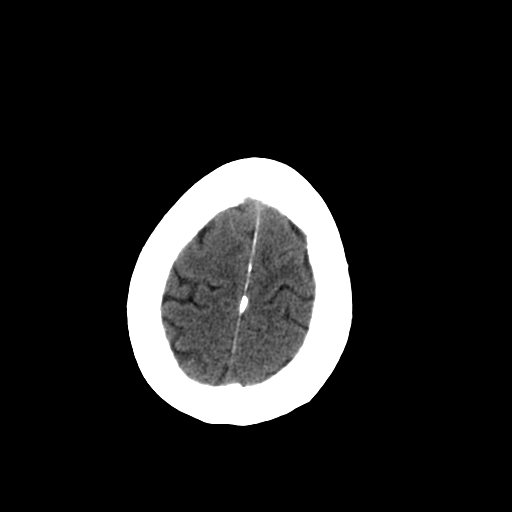
[im 29/32  brain]
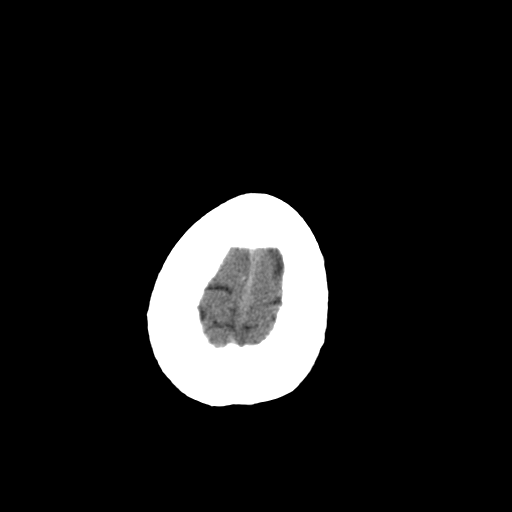
[im 29/32  bone]
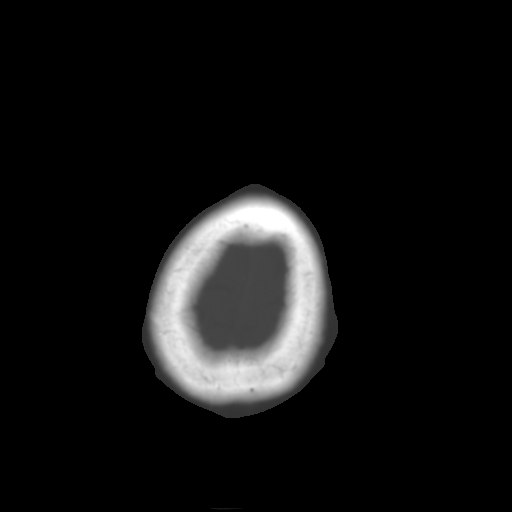

[13 of 30 positions shown; findings below may reference images not displayed]

FINDINGS: Hyperdense material centered at the right superior
cerebellar peduncle/fourth ventricle once again noted.  Question if
this represents hemorrhage into a cavernoma?

Slight decrease density of blood noted within the aqueduct
extending into the third ventricle.

Multiple remote infarcts and small vessel disease type changes as
noted on MR.

The MR questioned new small acute infarct of the medial right
thalamus and the superior cerebral peduncle are not as well
appreciated on the present CT.
IMPRESSION: Hyperdense material centered at the right superior cerebellar
peduncle/fourth ventricle once again noted.  Question if this
represents hemorrhage into a cavernoma as versus hemorrhagic
infarct as suggested on recent MR report?

Slight decrease density of blood noted within the aqueduct
extending into the third ventricle.

Multiple remote infarcts and small vessel disease type changes as
noted on MR.

The MR questioned new small medial right thalamus is not as well
appreciated on the present CT.

## 2013-02-16 IMAGING — CR DG CHEST 2V
2 series · 2 of 2 positions shown · non-contrast
Comparison: 07/01/2011.

CLINICAL DATA: 65-year-old female with weakness, shortness of
breath, left pleural effusion.

CHEST - 2 VIEW

[w chest lat]
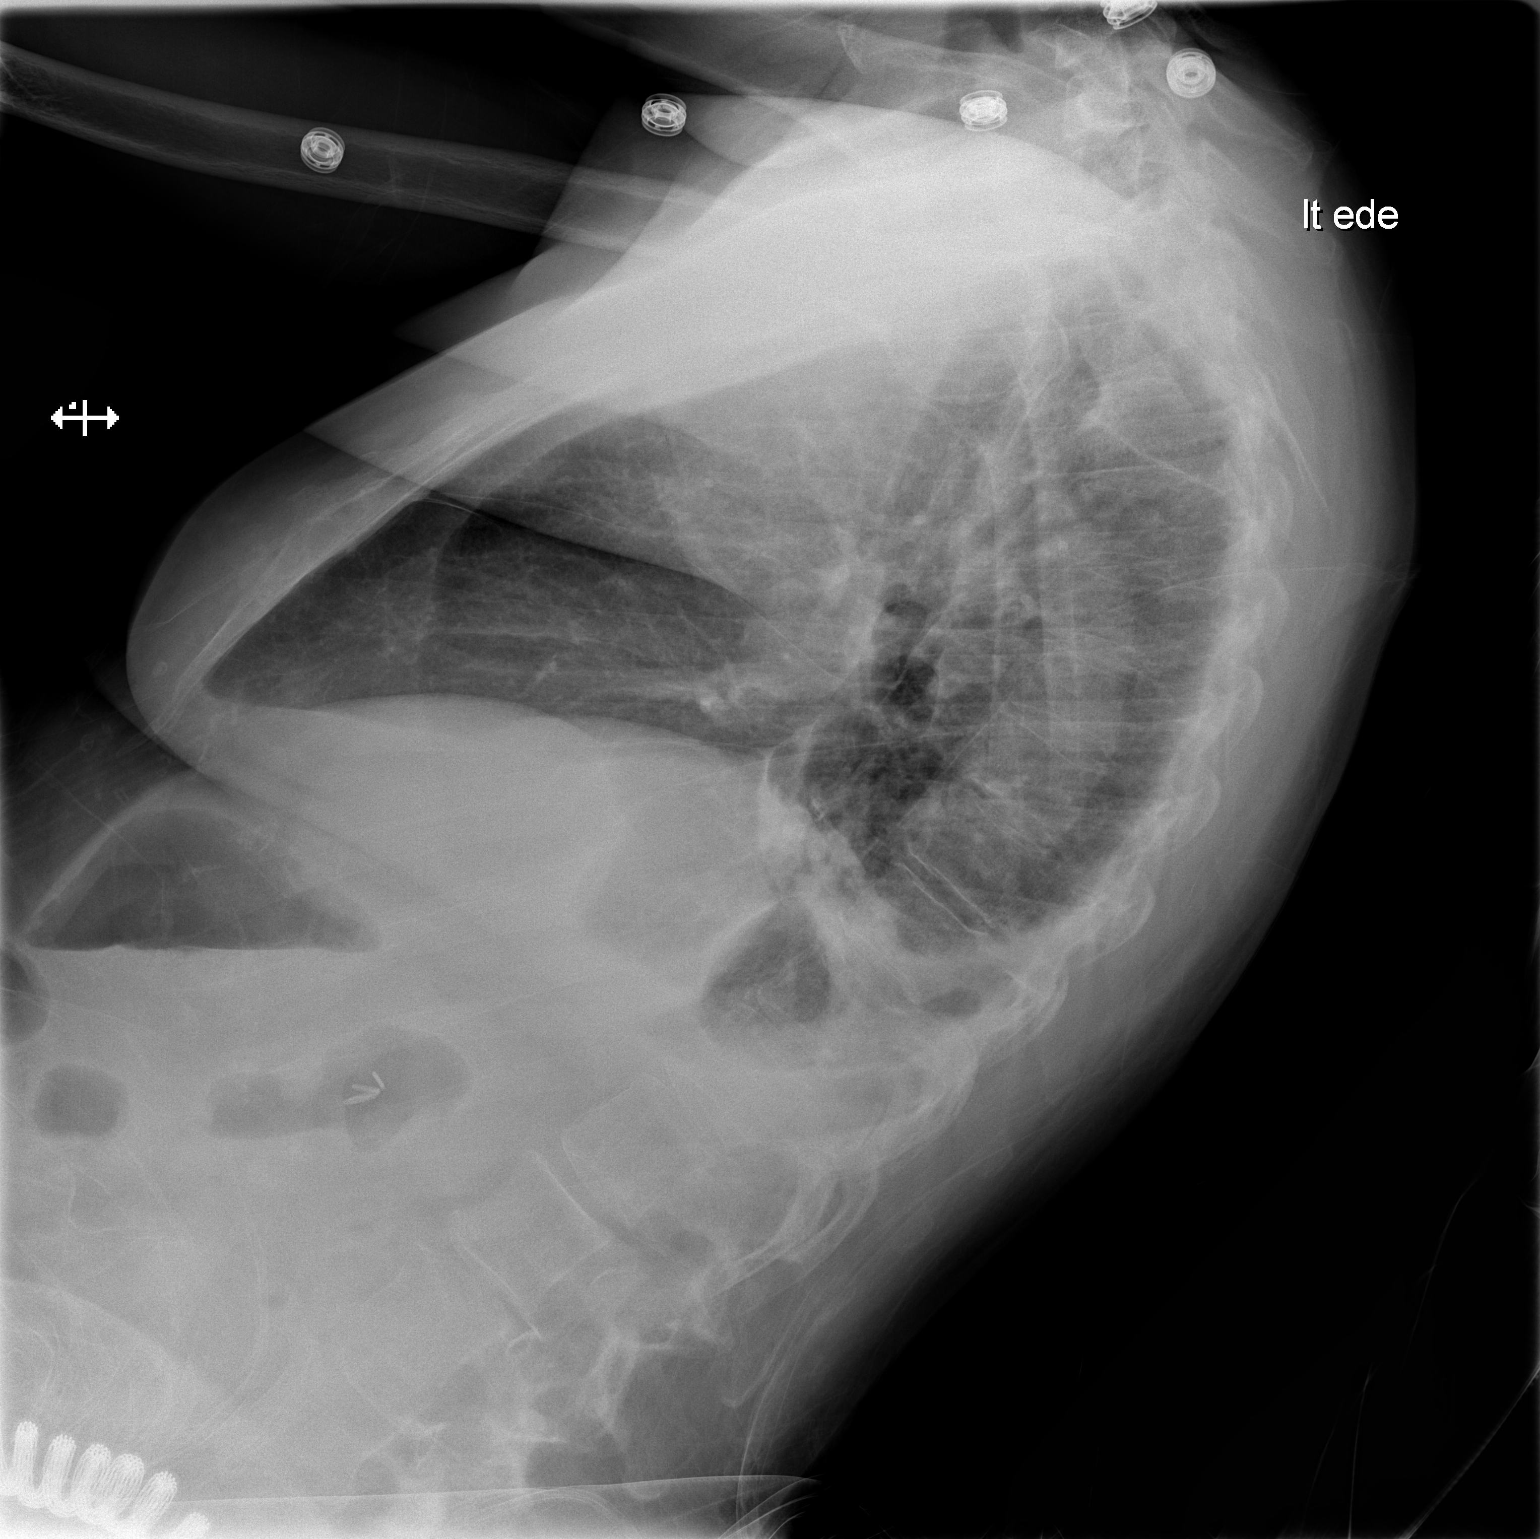

[x chest ap]
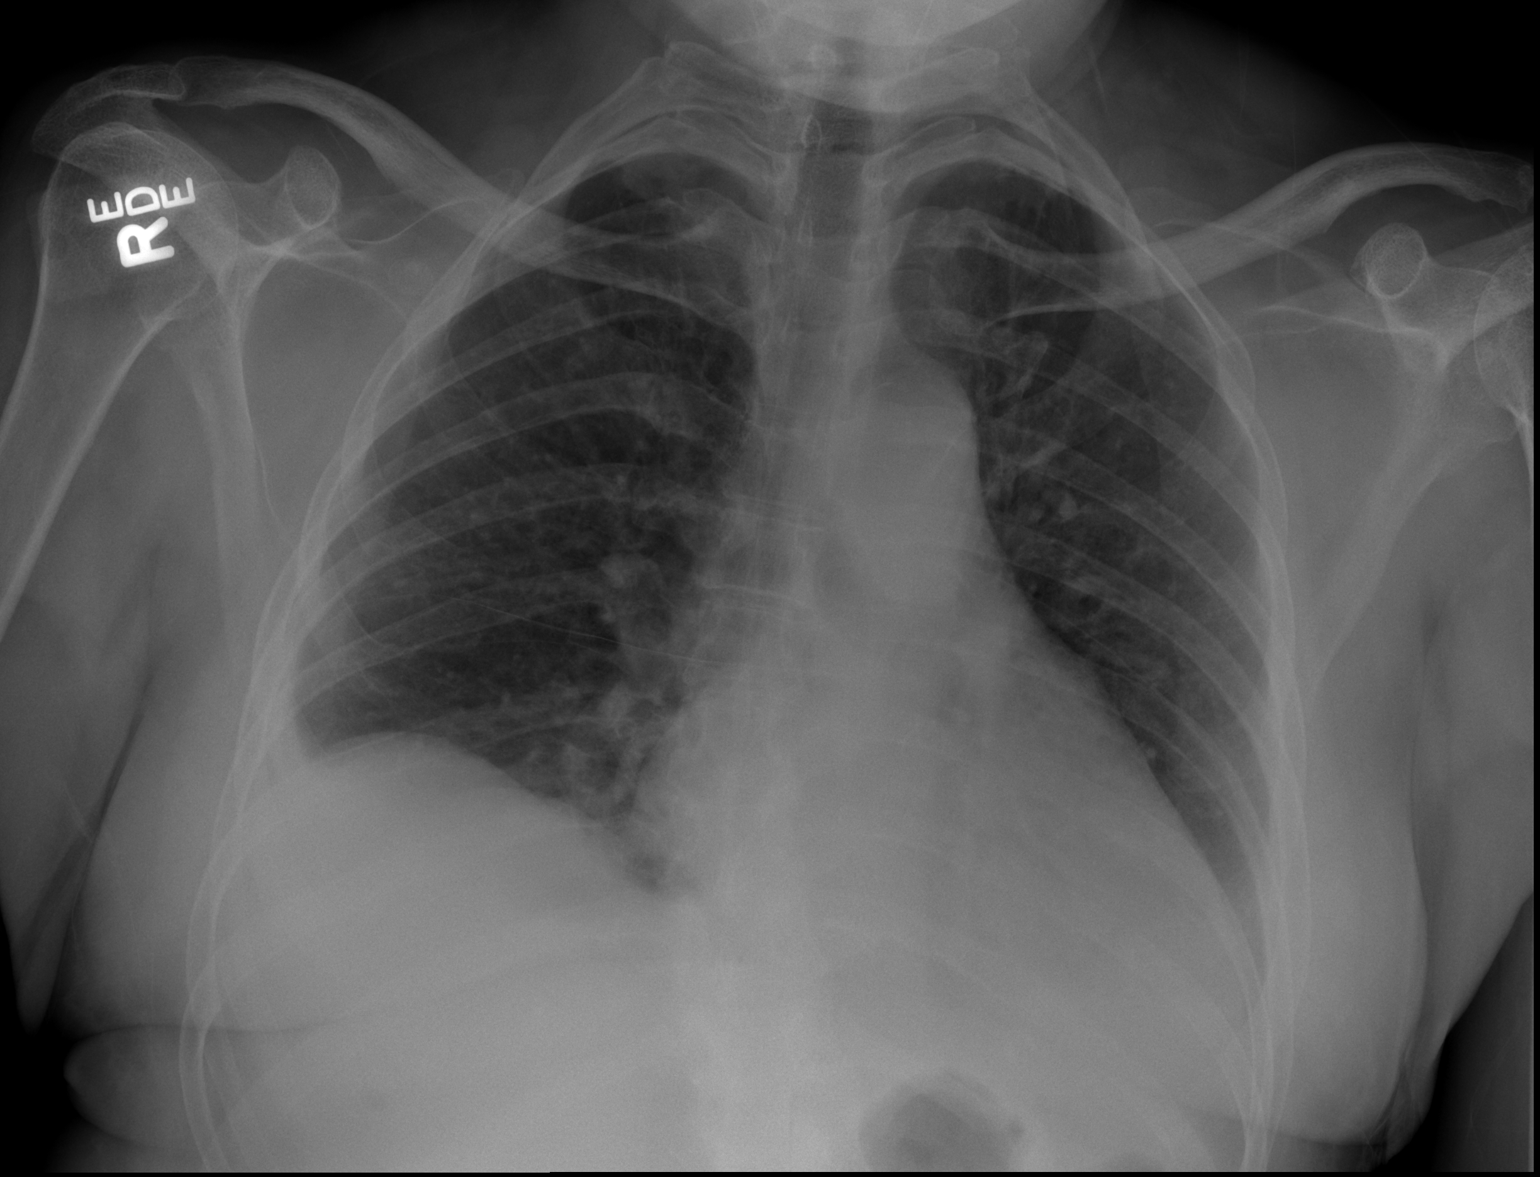

[2 of 2 positions shown; findings below may reference images not displayed]

FINDINGS: Continued low lung volumes.  Small bilateral pleural
effusions.  Associated patchy basilar airspace disease may be
greater on the left.  No pneumothorax or pulmonary edema.  The
upper lobes are clear.  Cardiac size and mediastinal contours are
within normal limits.  Visualized tracheal air column is within
normal limits.  No acute osseous abnormality identified.  Right
upper quadrant surgical clips.
IMPRESSION: Low lung volumes with small bilateral pleural effusions.
Associated patchy bibasilar opacity, favor atelectasis.

## 2013-02-28 ENCOUNTER — Other Ambulatory Visit: Payer: Self-pay | Admitting: Family

## 2013-04-07 ENCOUNTER — Other Ambulatory Visit: Payer: Self-pay | Admitting: Family

## 2013-05-13 ENCOUNTER — Other Ambulatory Visit: Payer: Self-pay | Admitting: Family

## 2013-05-29 ENCOUNTER — Other Ambulatory Visit: Payer: Self-pay | Admitting: Family

## 2013-06-07 ENCOUNTER — Encounter: Payer: Self-pay | Admitting: Family

## 2013-06-07 ENCOUNTER — Telehealth: Payer: Self-pay | Admitting: Family

## 2013-06-07 ENCOUNTER — Ambulatory Visit (INDEPENDENT_AMBULATORY_CARE_PROVIDER_SITE_OTHER): Payer: Medicare Other | Admitting: Family

## 2013-06-07 VITALS — BP 142/82 | HR 73 | Ht 60.5 in | Wt 106.0 lb

## 2013-06-07 DIAGNOSIS — Z1231 Encounter for screening mammogram for malignant neoplasm of breast: Secondary | ICD-10-CM

## 2013-06-07 DIAGNOSIS — E876 Hypokalemia: Secondary | ICD-10-CM

## 2013-06-07 DIAGNOSIS — E039 Hypothyroidism, unspecified: Secondary | ICD-10-CM

## 2013-06-07 DIAGNOSIS — Z23 Encounter for immunization: Secondary | ICD-10-CM

## 2013-06-07 DIAGNOSIS — I10 Essential (primary) hypertension: Secondary | ICD-10-CM

## 2013-06-07 DIAGNOSIS — Z0000 Encounter for general adult medical examination without abnormal findings: Secondary | ICD-10-CM

## 2013-06-07 LAB — HEPATIC FUNCTION PANEL
ALBUMIN: 4.5 g/dL (ref 3.5–5.2)
ALT: 32 U/L (ref 0–35)
AST: 28 U/L (ref 0–37)
Alkaline Phosphatase: 48 U/L (ref 39–117)
BILIRUBIN DIRECT: 0 mg/dL (ref 0.0–0.3)
Total Bilirubin: 0.8 mg/dL (ref 0.2–1.2)
Total Protein: 7.7 g/dL (ref 6.0–8.3)

## 2013-06-07 LAB — LIPID PANEL
Cholesterol: 246 mg/dL — ABNORMAL HIGH (ref 0–200)
HDL: 98.1 mg/dL (ref >39.00)
LDL Cholesterol: 141 mg/dL — ABNORMAL HIGH (ref 0–99)
Total CHOL/HDL Ratio: 3
Triglycerides: 34 mg/dL (ref 0.0–149.0)
VLDL: 6.8 mg/dL (ref 0.0–40.0)

## 2013-06-07 LAB — CBC WITH DIFFERENTIAL/PLATELET
BASOS ABS: 0 10*3/uL (ref 0.0–0.1)
Basophils Relative: 0.5 % (ref 0.0–3.0)
EOS ABS: 0 10*3/uL (ref 0.0–0.7)
Eosinophils Relative: 0.7 % (ref 0.0–5.0)
HCT: 36.6 % (ref 36.0–46.0)
Hemoglobin: 12.4 g/dL (ref 12.0–15.0)
LYMPHS PCT: 47 % — AB (ref 12.0–46.0)
Lymphs Abs: 2.2 10*3/uL (ref 0.7–4.0)
MCHC: 33.7 g/dL (ref 30.0–36.0)
MCV: 90.7 fl (ref 78.0–100.0)
Monocytes Absolute: 0.4 10*3/uL (ref 0.1–1.0)
Monocytes Relative: 7.6 % (ref 3.0–12.0)
Neutro Abs: 2.1 10*3/uL (ref 1.4–7.7)
Neutrophils Relative %: 44.2 % (ref 43.0–77.0)
Platelets: 206 10*3/uL (ref 150.0–400.0)
RBC: 4.04 Mil/uL (ref 3.87–5.11)
RDW: 14.9 % (ref 11.5–15.5)
WBC: 4.7 10*3/uL (ref 4.0–10.5)

## 2013-06-07 LAB — POCT URINALYSIS DIPSTICK
BILIRUBIN UA: NEGATIVE
Blood, UA: NEGATIVE
GLUCOSE UA: NEGATIVE
Ketones, UA: NEGATIVE
Nitrite, UA: NEGATIVE
PROTEIN UA: NEGATIVE
SPEC GRAV UA: 1.015
Urobilinogen, UA: 0.2
pH, UA: 7

## 2013-06-07 LAB — BASIC METABOLIC PANEL
BUN: 14 mg/dL (ref 6–23)
CHLORIDE: 101 meq/L (ref 96–112)
CO2: 28 meq/L (ref 19–32)
CREATININE: 0.7 mg/dL (ref 0.4–1.2)
Calcium: 10.2 mg/dL (ref 8.4–10.5)
GFR: 110.78 mL/min (ref >60.00)
GLUCOSE: 90 mg/dL (ref 70–99)
Potassium: 3.6 mEq/L (ref 3.5–5.1)
Sodium: 139 mEq/L (ref 135–145)

## 2013-06-07 LAB — TSH: TSH: 10.58 u[IU]/mL — ABNORMAL HIGH (ref 0.35–4.50)

## 2013-06-07 NOTE — Patient Instructions (Signed)

## 2013-06-07 NOTE — Telephone Encounter (Signed)
Relevant patient education assigned to patient using Emmi. ° °

## 2013-06-07 NOTE — Progress Notes (Signed)
Pre visit review using our clinic review tool, if applicable. No additional management support is needed unless otherwise documented below in the visit note. 

## 2013-06-07 NOTE — Progress Notes (Signed)
Subjective:    Patient ID: Jacqueline King, female    DOB: [birth year], 68 y.o.   MRN: [id number]  HPI 68 year old African American female, is in for a complete physical exam. I reviewed all health maintenance protocols including mammography, colonoscopy, bone density Needed referrals were placed. Age and diagnosis  appropriate screening labs were ordered. Her immunization history was reviewed and appropriate vaccinations were ordered. Her current medications and allergies were reviewed and needed refills of her chronic medications were ordered. The plan for yearly health maintenance was discussed all orders and referrals were made as appropriate.  Declines pneumovax.   Review of Systems  Constitutional: Negative.   HENT: Negative.   Eyes: Negative.   Respiratory: Negative.   Cardiovascular: Negative.   Gastrointestinal: Negative.   Endocrine: Negative.   Genitourinary: Negative.   Musculoskeletal: Negative.   Skin: Negative.   Allergic/Immunologic: Negative.   Neurological: Negative.   Hematological: Negative.   Psychiatric/Behavioral: Negative.    Past Medical History  Diagnosis Date  . Hypertension   . H/O radioactive iodine thyroid ablation   . Stroke     History   Social History  . Marital Status: Widowed    Spouse Name: N/A    Number of Children: 4  . Years of Education: 4025yrs   Occupational History  . Retired  [employer]   Social History Main Topics  . Smoking status: Former Smoker -- 0.50 packs/day for 7 years    Types: Cigarettes    Quit date: 01/29/1972  . Smokeless tobacco: Former NeurosurgeonUser    Types: Snuff, Chew     Comment: as a teenager  . Alcohol Use: No  . Drug Use: No  . Sexual Activity: Not on file   Other Topics Concern  . 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    Patient denies taking all illict drugs.   Patient lives at home alone.     Past Surgical History    Procedure Laterality Date  . Abdominal hysterectomy    . Cholecystectomy    . Renal artery angioplasty  1993 or 1994    Family History  Problem Relation Age of Onset  . Diabetes Mother   . Thyroid disease Mother   . Diabetes Sister   . Graves' disease Sister   . Thyroid disease Sister   . Diabetes Brother   . Hypertension      Allergies  Allergen Reactions  . Sulfa Drugs Cross Reactors Hives    Current Outpatient Prescriptions on File Prior to Visit  Medication Sig Dispense Refill  . amLODipine (NORVASC) 10 MG tablet TAKE 1 TABLET BY MOUTH EVERY DAY  90 tablet  0  . aspirin EC 81 MG tablet Take 1 tablet (81 mg total) by mouth daily.      Marland Kitchen. levothyroxine (SYNTHROID, LEVOTHROID) 75 MCG tablet TAKE 1 TABLET BY MOUTH DAILY BEFORE BREAKFAST  90 tablet  0  . losartan-hydrochlorothiazide (HYZAAR) 100-25 MG per tablet TAKE 1 TABLET BY MOUTH DAILY  90 tablet  0  . potassium chloride SA (K-DUR,KLOR-CON) 20 MEQ tablet TAKE 1 TABLET BY MOUTH DAILY  90 tablet  0   No current facility-administered medications on file prior to visit.    BP 142/82  Pulse 73  Ht 5' 0.5" (1.537 m)  Wt 106 lb (48.081 kg)  BMI 20.35 kg/m2  SpO2 99%chart    Objective:  Physical Exam  Constitutional: She is oriented to person, place, and time. She appears well-developed and well-nourished.  HENT:  Head: Normocephalic and atraumatic.  Right Ear: External ear normal.  Left Ear: External ear normal.  Nose: Nose normal.  Mouth/Throat: Oropharynx is clear and moist.  Eyes: Conjunctivae and EOM are normal. Pupils are equal, round, and reactive to light.  Neck: Normal range of motion. Neck supple. No thyromegaly present.  Cardiovascular: Normal rate, regular rhythm and normal heart sounds.   Pulmonary/Chest: Effort normal and breath sounds normal.  Abdominal: Soft. Bowel sounds are normal.  Musculoskeletal: Normal range of motion. She exhibits no edema and no tenderness.  Neurological: She is alert and  oriented to person, place, and time. She has normal reflexes. She displays normal reflexes. No cranial nerve deficit. Coordination normal.  Skin: Skin is warm and dry.  Psychiatric: She has a normal mood and affect.          Assessment & Plan:  Jacqueline King was seen today for annual exam.  Diagnoses and associated orders for this visit:  Preventative health care - Basic Metabolic Panel - Hepatic Function Panel - CBC with Differential - Lipid Panel - TSH - POCT urinalysis dipstick  Unspecified hypothyroidism - Basic Metabolic Panel - Hepatic Function Panel - CBC with Differential - Lipid Panel - TSH - POCT urinalysis dipstick  Unspecified essential hypertension - Basic Metabolic Panel - Hepatic Function Panel - CBC with Differential - Lipid Panel - TSH - POCT urinalysis dipstick  Hypokalemia - Basic Metabolic Panel - Hepatic Function Panel - CBC with Differential - Lipid Panel - TSH - POCT urinalysis dipstick  Screening mammogram for high-risk patient - MM Digital Screening; Future  Need for prophylactic vaccination with tetanus toxoid alone - Td vaccine preservative free greater than or equal to 7yo IM   Call the office with any questions or concerns. Recheck in 6 months or sooner as needed.

## 2013-06-08 MED ORDER — LEVOTHYROXINE SODIUM 88 MCG PO TABS: 88.0000 ug | ORAL_TABLET | Freq: Every day | ORAL | Status: DC

## 2013-06-08 NOTE — Addendum Note (Signed)
Addended byAdline Mango: CAMPBELL, Quynn Vilchis B on: 06/08/2013 01:16 PM   Modules accepted: Orders

## 2013-06-22 ENCOUNTER — Ambulatory Visit
Admission: RE | Admit: 2013-06-22 | Discharge: 2013-06-22 | Disposition: A | Discharge status: Home / Self Care | Payer: Medicare Other | Source: Ambulatory Visit | Attending: Family | Admitting: Family

## 2013-06-22 ENCOUNTER — Other Ambulatory Visit: Payer: Self-pay | Admitting: Family

## 2013-06-22 DIAGNOSIS — Z1231 Encounter for screening mammogram for malignant neoplasm of breast: Secondary | ICD-10-CM

## 2013-06-24 ENCOUNTER — Other Ambulatory Visit: Payer: Self-pay | Admitting: Family

## 2013-06-24 DIAGNOSIS — R928 Other abnormal and inconclusive findings on diagnostic imaging of breast: Secondary | ICD-10-CM

## 2013-07-05 ENCOUNTER — Ambulatory Visit
Admission: RE | Admit: 2013-07-05 | Discharge: 2013-07-05 | Disposition: A | Discharge status: Home / Self Care | Payer: Medicare Other | Source: Ambulatory Visit | Attending: Family | Admitting: Family

## 2013-07-05 DIAGNOSIS — R928 Other abnormal and inconclusive findings on diagnostic imaging of breast: Secondary | ICD-10-CM

## 2013-07-20 ENCOUNTER — Other Ambulatory Visit (INDEPENDENT_AMBULATORY_CARE_PROVIDER_SITE_OTHER): Payer: Medicare Other

## 2013-07-20 DIAGNOSIS — E039 Hypothyroidism, unspecified: Secondary | ICD-10-CM

## 2013-07-20 LAB — TSH: TSH: 2.21 u[IU]/mL (ref 0.35–4.50)

## 2013-07-21 ENCOUNTER — Telehealth: Payer: Self-pay

## 2013-07-21 MED ORDER — LEVOTHYROXINE SODIUM 88 MCG PO TABS: 88.0000 ug | ORAL_TABLET | Freq: Every day | ORAL | Status: DC

## 2013-07-21 NOTE — Telephone Encounter (Signed)
Pt aware of results and 90 supply sent to pharmacy because [place] the [place] 2 [place] and only has a 1 month supply left

## 2013-08-16 ENCOUNTER — Ambulatory Visit: Payer: Medicare Other | Admitting: Nurse Practitioner

## 2013-08-16 ENCOUNTER — Telehealth: Payer: Self-pay | Admitting: Nurse Practitioner

## 2013-08-16 NOTE — Telephone Encounter (Signed)
Patient was no show for today's office appointment. Same day cancellation. 

## 2013-09-03 ENCOUNTER — Other Ambulatory Visit: Payer: Self-pay | Admitting: Family

## 2013-10-13 ENCOUNTER — Encounter: Payer: Self-pay | Admitting: Nurse Practitioner

## 2013-10-13 ENCOUNTER — Ambulatory Visit (INDEPENDENT_AMBULATORY_CARE_PROVIDER_SITE_OTHER): Payer: Medicare Other | Admitting: Nurse Practitioner

## 2013-10-13 ENCOUNTER — Encounter (INDEPENDENT_AMBULATORY_CARE_PROVIDER_SITE_OTHER): Payer: Self-pay

## 2013-10-13 VITALS — BP 136/76 | HR 89 | Ht 60.0 in | Wt 100.4 lb

## 2013-10-13 DIAGNOSIS — Z8673 Personal history of transient ischemic attack (TIA), and cerebral infarction without residual deficits: Secondary | ICD-10-CM

## 2013-10-13 DIAGNOSIS — E785 Hyperlipidemia, unspecified: Secondary | ICD-10-CM | POA: Insufficient documentation

## 2013-10-13 MED ORDER — ATORVASTATIN CALCIUM 40 MG PO TABS: 40.0000 mg | ORAL_TABLET | Freq: Every day | ORAL | Status: DC

## 2013-10-13 NOTE — Progress Notes (Signed)
PATIENT: Jacqueline King DOB: 06-20-1945  REASON FOR VISIT: routine follow up for stroke HISTORY FROM: patient  HISTORY OF PRESENT ILLNESS: 68 year old lady with hemorrhagic cerebellar infarct as well as small bilateral lacunar infarcts in June 2013 secondary to malignant hypertension.   She returns for followup today after last visit with me on 02/12/13. She reports that she is doing very well and has had no recurrent stroke symptoms. Her bp she states is now well controlled, it is 136/76 today.  Her last lipid panel in May at PCP office shows cholesterol of 246 total, and LDL of 141.  She states that she stopped taking Lipitor over a year ago because she thought she didn't need it. She has no complaints and is tolerating aspirin daily well without significant bruising.   ROS:  14 system review of systems is positive for no complaints  ALLERGIES: Allergies  Allergen Reactions  . Sulfa Drugs Cross Reactors Hives    HOME MEDICATIONS: Outpatient Prescriptions Prior to Visit  Medication Sig Dispense Refill  . amLODipine (NORVASC) 10 MG tablet TAKE 1 TABLET BY MOUTH EVERY DAY  90 tablet  0  . aspirin EC 81 MG tablet Take 1 tablet (81 mg total) by mouth daily.      Marland Kitchen levothyroxine (SYNTHROID, LEVOTHROID) 88 MCG tablet Take 1 tablet (88 mcg total) by mouth daily.  90 tablet  0  . losartan-hydrochlorothiazide (HYZAAR) 100-25 MG per tablet TAKE 1 TABLET BY MOUTH DAILY  90 tablet  0  . potassium chloride SA (K-DUR,KLOR-CON) 20 MEQ tablet TAKE 1 TABLET BY MOUTH DAILY  90 tablet  0   No facility-administered medications prior to visit.    PHYSICAL EXAM Filed Vitals:   10/13/13 1411  BP: 136/76  Pulse: 89  Height: 5' (1.524 m)  Weight: 100 lb 6.4 oz (45.541 kg)   Body mass index is 19.61 kg/(m^2). No exam data present No flowsheet data found.  No flowsheet data found.  General: Frail elderly African American lady seated, in no evident distress  Head: head normocephalic and  atraumatic. Orohparynx benign  Neck: supple with no carotid or supraclavicular bruits  Cardiovascular: regular rate and rhythm, no murmurs  Musculoskeletal: no deformity  Skin: no rash/petichiae  Vascular: Normal pulses all extremities   Neurologic Exam  Mental Status: Awake and fully alert. Oriented to place and time. Recent and remote memory intact. Attention span, concentration and fund of knowledge appropriate. Mood and affect appropriate.  Cranial Nerves: Pupils equal, briskly reactive to light. Extraocular movements full without nystagmus. BILATERAL EXOPTHAMOS, L>R. Visual fields full to confrontation. Hearing intact. Facial sensation intact. Face, tongue, palate moves normally and symmetrically.  Motor: Normal bulk and tone. Normal strength in all tested extremity muscles.  Sensory: intact to touch and pinprick and vibratory.  Coordination: Rapid alternating movements normal in all extremities. Finger-to-nose and heel-to-shin performed accurately bilaterally.  Gait and Station: Arises from chair without difficulty. Stance is normal. Gait demonstrates normal stride length and balance . Not able to heel, toe and tandem walk without difficulty.  Reflexes: 1+ and symmetric. Toes downgoing.   DIAGNOSTIC DATA (LABS, IMAGING, TESTING) - I reviewed patient records, labs, notes, testing and imaging myself where available.  Lab Results  Component Value Date   WBC 4.7 06/07/2013   HGB 12.4 06/07/2013   HCT 36.6 06/07/2013   MCV 90.7 06/07/2013   PLT 206.0 06/07/2013      Component Value Date/Time   NA 139 06/07/2013 1048   K  3.6 06/07/2013 1048   CL 101 06/07/2013 1048   CO2 28 06/07/2013 1048   GLUCOSE 90 06/07/2013 1048   BUN 14 06/07/2013 1048   CREATININE 0.7 06/07/2013 1048   CALCIUM 10.2 06/07/2013 1048   PROT 7.7 06/07/2013 1048   ALBUMIN 4.5 06/07/2013 1048   AST 28 06/07/2013 1048   ALT 32 06/07/2013 1048   ALKPHOS 48 06/07/2013 1048   BILITOT 0.8 06/07/2013 1048   GFRNONAA 67* 07/10/2011  0645   GFRAA 78* 07/10/2011 0645   Lab Results  Component Value Date   CHOL 246* 06/07/2013   HDL 98.10 06/07/2013   LDLCALC 141* 06/07/2013   TRIG 34.0 06/07/2013   CHOLHDL 3 06/07/2013    ASSESSMENT: 68 year old lady with hemorrhagic cerebellar infarct as well as small bilateral lacunar infarcts in June 2013 secondary to malignant hypertension. She is doing well with better control of hypertension and no residual deficits. Recent elevated lipid panel, recommending that she restart Lipitor.  PLAN:  Continue aggressive blood pressure control with blood pressure goal below 130/90 and lipid control with LDL goal below 100 mg percent. Continue aspirin 81 mg daily. Repeat carotid doppler study. Recommend going back on statin medication for cholesterol if able to tolerate as her last lipid panel was elevated. Return for followup in 6 months with Dr. Pearlean Brownie, sooner as needed.  Orders Placed This Encounter  Procedures  . US Carotid Bilateral   Meds ordered this encounter  Medications  . atorvastatin (LIPITOR) 40 MG tablet    Sig: Take 1 tablet (40 mg total) by mouth daily at 6 PM.    Dispense:  90 tablet    Refill:  3    Order Specific Question:  Supervising Provider    Answer:  Suanne Marker [[id number]]   Return in about 6 months (around 04/13/2014) for stroke revisit.  Tawny Asal LAM, MSN, FNP-BC, A/GNP-C 10/15/2013, 1:05 PM [facility] [address], [address] [place], [place] [address] [phone]  Note: This document was prepared with digital dictation and possible smart phrase technology. Any transcriptional errors that result from this process are unintentional.

## 2013-10-13 NOTE — Patient Instructions (Addendum)
Continue aggressive blood pressure control with blood pressure goal below 130/90 and lipid control with LDL goal below 100 mg percent. Continue aspirin 81 mg daily.   Repeat carotid doppler study.   Recommend going back on statin medication for cholesterol if able to tolerate. Start Atorvastatin 40 mg daily at bedtime.  Get your lipid panel checked at your family doctors office in 6 weeks.  Return for followup in 6 months with Dr. Pearlean Brownie, sooner as needed.  Stroke Prevention Some medical conditions and behaviors are associated with an increased chance of having a stroke. You may prevent a stroke by making healthy choices and managing medical conditions. HOW CAN I REDUCE MY RISK OF HAVING A STROKE?   Stay physically active. Get at least 30 minutes of activity on most or all days.  Do not smoke. It may also be helpful to avoid exposure to secondhand smoke.  Limit alcohol use. Moderate alcohol use is considered to be:  No more than 2 drinks per day for men.  No more than 1 drink per day for nonpregnant women.  Eat healthy foods. This involves:  Eating 5 or more servings of fruits and vegetables a day.  Making dietary changes that address high blood pressure (hypertension), high cholesterol, diabetes, or obesity.  Manage your cholesterol levels.  Making food choices that are high in fiber and low in saturated fat, trans fat, and cholesterol may control cholesterol levels.  Take any prescribed medicines to control cholesterol as directed by your health care provider.  Manage your diabetes.  Controlling your carbohydrate and sugar intake is recommended to manage diabetes.  Take any prescribed medicines to control diabetes as directed by your health care provider.  Control your hypertension.  Making food choices that are low in salt (sodium), saturated fat, trans fat, and cholesterol is recommended to manage hypertension.  Take any prescribed medicines to control hypertension as  directed by your health care provider.  Maintain a healthy weight.  Reducing calorie intake and making food choices that are low in sodium, saturated fat, trans fat, and cholesterol are recommended to manage weight.  Stop drug abuse.  Avoid taking birth control pills.  Talk to your health care provider about the risks of taking birth control pills if you are over 103 years old, smoke, get migraines, or have ever had a blood clot.  Get evaluated for sleep disorders (sleep apnea).  Talk to your health care provider about getting a sleep evaluation if you snore a lot or have excessive sleepiness.  Take medicines only as directed by your health care provider.  For some people, aspirin or blood thinners (anticoagulants) are helpful in reducing the risk of forming abnormal blood clots that can lead to stroke. If you have the irregular heart rhythm of atrial fibrillation, you should be on a blood thinner unless there is a good reason you cannot take them.  Understand all your medicine instructions.  Make sure that other conditions (such as anemia or atherosclerosis) are addressed. SEEK IMMEDIATE MEDICAL CARE IF:   You have sudden weakness or numbness of the face, arm, or leg, especially on one side of the body.  Your face or eyelid droops to one side.  You have sudden confusion.  You have trouble speaking (aphasia) or understanding.  You have sudden trouble seeing in one or both eyes.  You have sudden trouble walking.  You have dizziness.  You have a loss of balance or coordination.  You have a sudden, severe headache with  no known cause.  You have new chest pain or an irregular heartbeat. Any of these symptoms may represent a serious problem that is an emergency. Do not wait to see if the symptoms will go away. Get medical help at once. Call your local emergency services (911 in [place] Do not drive yourself to the hospital. Document Released: 02/22/2004 Document Revised:  05/31/2013 Document Reviewed: 07/17/2012 [facility] Patient Information 2015 [place], [place]. This information is not intended to replace advice given to you by your health care provider. Make sure you discuss any questions you have with your health care provider.

## 2013-10-15 ENCOUNTER — Encounter: Payer: Self-pay | Admitting: Nurse Practitioner

## 2013-10-15 NOTE — Progress Notes (Signed)
I agree with the above plan 

## 2013-10-25 ENCOUNTER — Other Ambulatory Visit: Payer: Self-pay | Admitting: Family

## 2013-11-03 ENCOUNTER — Ambulatory Visit (INDEPENDENT_AMBULATORY_CARE_PROVIDER_SITE_OTHER): Payer: Medicare Other

## 2013-11-03 DIAGNOSIS — Z8673 Personal history of transient ischemic attack (TIA), and cerebral infarction without residual deficits: Secondary | ICD-10-CM

## 2013-11-03 DIAGNOSIS — E785 Hyperlipidemia, unspecified: Secondary | ICD-10-CM

## 2013-11-08 ENCOUNTER — Other Ambulatory Visit: Payer: Self-pay | Admitting: Family

## 2013-11-19 ENCOUNTER — Telehealth: Payer: Self-pay | Admitting: Nurse Practitioner

## 2013-11-19 ENCOUNTER — Encounter: Payer: Self-pay | Admitting: Nurse Practitioner

## 2013-11-19 NOTE — Telephone Encounter (Signed)
Please call patient with Carotid doppler results, No significant change compared to previous study. Recommend repeating study in 1 year.

## 2013-11-19 NOTE — Progress Notes (Signed)
Subjective:    Patient ID: Jacqueline ParishHelena J King is a 68 y.o. female.  HPI error  Review of Systems  Objective:  Neurologic Exam  Physical Exam  Assessment:   error  Plan:   error

## 2013-11-25 NOTE — Telephone Encounter (Signed)
Called and shared results with patient, verbalized understanding

## 2014-02-09 ENCOUNTER — Other Ambulatory Visit: Payer: Self-pay

## 2014-02-09 MED ORDER — LOSARTAN POTASSIUM-HCTZ 100-25 MG PO TABS: 1.0000 | ORAL_TABLET | Freq: Every day | ORAL | Status: DC

## 2014-03-14 ENCOUNTER — Other Ambulatory Visit: Payer: Self-pay | Admitting: Family

## 2014-05-18 ENCOUNTER — Other Ambulatory Visit: Payer: Self-pay | Admitting: Family

## 2014-06-18 ENCOUNTER — Other Ambulatory Visit: Payer: Self-pay | Admitting: Family

## 2014-06-28 ENCOUNTER — Other Ambulatory Visit: Payer: Self-pay | Admitting: Family

## 2014-08-05 ENCOUNTER — Ambulatory Visit (INDEPENDENT_AMBULATORY_CARE_PROVIDER_SITE_OTHER): Payer: 59 | Admitting: Family

## 2014-08-05 ENCOUNTER — Encounter: Payer: Self-pay | Admitting: Family

## 2014-08-05 VITALS — HR 80 | Temp 98.7°F | Wt 104.0 lb

## 2014-08-05 DIAGNOSIS — I10 Essential (primary) hypertension: Secondary | ICD-10-CM | POA: Diagnosis not present

## 2014-08-05 DIAGNOSIS — E876 Hypokalemia: Secondary | ICD-10-CM | POA: Diagnosis not present

## 2014-08-05 DIAGNOSIS — E039 Hypothyroidism, unspecified: Secondary | ICD-10-CM | POA: Diagnosis not present

## 2014-08-05 DIAGNOSIS — E7800 Pure hypercholesterolemia, unspecified: Secondary | ICD-10-CM

## 2014-08-05 LAB — BASIC METABOLIC PANEL
BUN: 10 mg/dL (ref 6–23)
CO2: 31 meq/L (ref 19–32)
Calcium: 9.6 mg/dL (ref 8.4–10.5)
Chloride: 105 mEq/L (ref 96–112)
Creatinine, Ser: 0.62 mg/dL (ref 0.40–1.20)
GFR: 122.82 mL/min (ref >60.00)
Glucose, Bld: 85 mg/dL (ref 70–99)
POTASSIUM: 3.5 meq/L (ref 3.5–5.1)
Sodium: 142 mEq/L (ref 135–145)

## 2014-08-05 LAB — HEPATIC FUNCTION PANEL
ALT: 22 U/L (ref 0–35)
AST: 22 U/L (ref 0–37)
Albumin: 4.1 g/dL (ref 3.5–5.2)
Alkaline Phosphatase: 61 U/L (ref 39–117)
BILIRUBIN DIRECT: 0.1 mg/dL (ref 0.0–0.3)
Total Bilirubin: 0.4 mg/dL (ref 0.2–1.2)
Total Protein: 7.3 g/dL (ref 6.0–8.3)

## 2014-08-05 LAB — LIPID PANEL
Cholesterol: 196 mg/dL (ref 0–200)
HDL: 84.8 mg/dL (ref >39.00)
LDL Cholesterol: 104 mg/dL — ABNORMAL HIGH (ref 0–99)
NONHDL: 111.2
Total CHOL/HDL Ratio: 2
Triglycerides: 37 mg/dL (ref 0.0–149.0)
VLDL: 7.4 mg/dL (ref 0.0–40.0)

## 2014-08-05 LAB — TSH: TSH: 0.73 u[IU]/mL (ref 0.35–4.50)

## 2014-08-05 NOTE — Patient Instructions (Signed)

## 2014-08-05 NOTE — Progress Notes (Signed)
Subjective:    Patient ID: Jacqueline King, female    DOB: 22-Jul-1945, 69 y.o.   MRN: [id number]  HPI 69 year old African-American female, nonsmoker with a history of hypertension,  hypokalemia, hypercholesterolemia, and hypothyroidism is in today for recheck. She's currently doing well. Tolerates medications well. Is not taking Lipitor. Tries to follow healthy diet but does not routinely exercise.   Review of Systems  Constitutional: Negative.   HENT: Negative.   Respiratory: Negative.   Cardiovascular: Negative.   Gastrointestinal: Negative.   Endocrine: Negative.   Genitourinary: Negative.   Musculoskeletal: Negative.   Skin: Negative.   Allergic/Immunologic: Negative.   Neurological: Negative.   Hematological: Negative.   Psychiatric/Behavioral: Negative.    Past Medical History  Diagnosis Date  . Hypertension   . H/O radioactive iodine thyroid ablation   . Stroke     History   Social History  . Marital Status: Widowed    Spouse Name: N/A  . Number of Children: 4  . Years of Education: 8994yrs   Occupational History  . Retired  [employer]   Social History Main Topics  . Smoking status: Former Smoker -- 0.50 packs/day for 7 years    Types: Cigarettes    Quit date: 01/29/1972  . Smokeless tobacco: [employer]    Types: Snuff, Chew     Comment: as a teenager  . Alcohol Use: No  . Drug Use: No  . Sexual Activity: Not on file   Other Topics Concern  . 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    Patient denies taking all illict drugs.   Patient lives at home alone.     Past Surgical History  Procedure Laterality Date  . Abdominal hysterectomy    . Cholecystectomy    . Renal artery angioplasty  1993 or 1994    Family History  Problem Relation Age of Onset  . Diabetes Mother   . Thyroid disease Mother   . Diabetes Sister   . Graves' disease Sister   . Thyroid disease Sister     . Diabetes Brother   . Hypertension      Allergies  Allergen Reactions  . Sulfa Drugs Cross Reactors Hives    Current Outpatient Prescriptions on File Prior to Visit  Medication Sig Dispense Refill  . amLODipine (NORVASC) 10 MG tablet TAKE 1 TABLET BY MOUTH EVERY DAY 90 tablet 1  . aspirin EC 81 MG tablet Take 1 tablet (81 mg total) by mouth daily.    Marland Kitchen. levothyroxine (SYNTHROID, LEVOTHROID) 88 MCG tablet TAKE 1 TABLET BY MOUTH DAILY 30 tablet 0  . losartan-hydrochlorothiazide (HYZAAR) 100-25 MG per tablet TAKE 1 TABLET BY MOUTH DAILY 90 tablet 3  . potassium chloride SA (K-DUR,KLOR-CON) 20 MEQ tablet TAKE 1 TABLET BY MOUTH DAILY 90 tablet 0  . atorvastatin (LIPITOR) 40 MG tablet Take 1 tablet (40 mg total) by mouth daily at 6 PM. (Patient not taking: Reported on 08/05/2014) 90 tablet 3   No current facility-administered medications on file prior to visit.    Pulse 80  Temp(Src) 98.7 F (37.1 C) (Oral)  Wt 104 lb (47.174 kg)chart    Objective:   Physical Exam  Constitutional: She is oriented to person, place, and time. She appears well-developed and well-nourished.  HENT:  Right Ear: External ear normal.  Left Ear: External ear normal.  Nose: Nose normal.  Mouth/Throat:  Oropharynx is clear and moist.  Neck: Normal range of motion. Neck supple. No thyromegaly present.  Cardiovascular: Normal rate, regular rhythm and normal heart sounds.   Pulmonary/Chest: Effort normal and breath sounds normal.  Abdominal: Soft. Bowel sounds are normal.  Musculoskeletal: Normal range of motion.  Neurological: She is alert and oriented to person, place, and time.  Skin: Skin is warm and dry.  Psychiatric: She has a normal mood and affect.          Assessment & Plan:  Cathleen was seen today for follow-up.  Diagnoses and all orders for this visit:  Essential hypertension Orders: -     Lipid Panel -     Basic Metabolic Panel -     Hepatic Function Panel -     TSH  Hypothyroidism,  unspecified hypothyroidism type Orders: -     Lipid Panel -     Basic Metabolic Panel -     Hepatic Function Panel -     TSH  Pure hypercholesterolemia Orders: -     Lipid Panel -     Basic Metabolic Panel -     Hepatic Function Panel -     TSH  Hypokalemia Orders: -     Lipid Panel -     Basic Metabolic Panel -     Hepatic Function Panel -     TSH   Call the office with any questions or concerns. Labs obtained today will notify patient pending results. Recheck in 6 months. Periodically check blood pressure outside the office.

## 2014-08-05 NOTE — Progress Notes (Signed)
Pre visit review using our clinic review tool, if applicable. No additional management support is needed unless otherwise documented below in the visit note. 

## 2014-09-05 ENCOUNTER — Other Ambulatory Visit: Payer: Self-pay | Admitting: Family

## 2014-10-28 ENCOUNTER — Other Ambulatory Visit: Payer: Self-pay | Admitting: Family

## 2015-02-26 ENCOUNTER — Other Ambulatory Visit: Payer: Self-pay | Admitting: Family

## 2015-03-21 ENCOUNTER — Other Ambulatory Visit: Payer: Self-pay | Admitting: Family

## 2015-04-10 ENCOUNTER — Ambulatory Visit: Payer: Self-pay | Admitting: Family Medicine

## 2015-04-28 ENCOUNTER — Other Ambulatory Visit: Payer: Self-pay | Admitting: Family

## 2015-05-29 ENCOUNTER — Other Ambulatory Visit: Payer: Self-pay | Admitting: Family Medicine

## 2015-06-18 ENCOUNTER — Other Ambulatory Visit: Payer: Self-pay | Admitting: Family

## 2015-06-27 ENCOUNTER — Other Ambulatory Visit: Payer: Self-pay | Admitting: Family

## 2015-06-27 MED ORDER — LOSARTAN POTASSIUM-HCTZ 100-25 MG PO TABS: 1.0000 | ORAL_TABLET | Freq: Every day | ORAL | Status: DC

## 2015-06-27 NOTE — Telephone Encounter (Signed)
Pt made aware rx sent to pharmacy. I also changed her appt to August. She voiced understanding.

## 2015-06-27 NOTE — Telephone Encounter (Signed)
May refill but can we work her in sooner You can use any slot 8:15, 8:45, 1:15 or 1:45 any day of the week

## 2015-06-27 NOTE — Telephone Encounter (Signed)
Ok to refill 

## 2015-06-27 NOTE — Telephone Encounter (Signed)
Pt had to resch her appt with dr hunter to feb 2018. Pt needs refill on losartan-hctz #90 [facility]  [address]

## 2015-07-17 ENCOUNTER — Ambulatory Visit: Payer: Self-pay | Admitting: Family Medicine

## 2015-07-22 ENCOUNTER — Other Ambulatory Visit: Payer: Self-pay | Admitting: Family Medicine

## 2015-07-24 ENCOUNTER — Ambulatory Visit: Payer: Self-pay | Admitting: Family Medicine

## 2015-09-21 ENCOUNTER — Ambulatory Visit: Payer: Medicare Other | Admitting: Family Medicine

## 2015-10-05 ENCOUNTER — Ambulatory Visit (INDEPENDENT_AMBULATORY_CARE_PROVIDER_SITE_OTHER): Payer: Medicare Other | Admitting: Family Medicine

## 2015-10-05 ENCOUNTER — Encounter: Payer: Self-pay | Admitting: Family Medicine

## 2015-10-05 VITALS — BP 180/100 | HR 86 | Temp 97.9°F | Ht 60.5 in | Wt 100.0 lb

## 2015-10-05 DIAGNOSIS — I701 Atherosclerosis of renal artery: Secondary | ICD-10-CM | POA: Diagnosis not present

## 2015-10-05 DIAGNOSIS — I10 Essential (primary) hypertension: Secondary | ICD-10-CM | POA: Diagnosis not present

## 2015-10-05 DIAGNOSIS — E785 Hyperlipidemia, unspecified: Secondary | ICD-10-CM

## 2015-10-05 DIAGNOSIS — E890 Postprocedural hypothyroidism: Secondary | ICD-10-CM

## 2015-10-05 DIAGNOSIS — I619 Nontraumatic intracerebral hemorrhage, unspecified: Secondary | ICD-10-CM

## 2015-10-05 MED ORDER — AMLODIPINE BESYLATE 10 MG PO TABS
10.0000 mg | ORAL_TABLET | Freq: Every day | ORAL | 1 refills | Status: AC
Start: 2015-10-05 — End: ?

## 2015-10-05 MED ORDER — LOSARTAN POTASSIUM-HCTZ 100-25 MG PO TABS: 1.0000 | ORAL_TABLET | Freq: Every day | ORAL | 1 refills | Status: AC

## 2015-10-05 MED ORDER — ATORVASTATIN CALCIUM 40 MG PO TABS
40.0000 mg | ORAL_TABLET | Freq: Every day | ORAL | 1 refills | Status: AC
Start: 2015-10-05 — End: ?

## 2015-10-05 MED ORDER — LEVOTHYROXINE SODIUM 88 MCG PO TABS: 88.0000 ug | ORAL_TABLET | Freq: Every day | ORAL | 1 refills | Status: AC

## 2015-10-05 NOTE — Progress Notes (Signed)
Pre visit review using our clinic review tool, if applicable. No additional management support is needed unless otherwise documented below in the visit note. 

## 2015-10-05 NOTE — Patient Instructions (Signed)
I will try to call your daughter but I want you to work with her to get you in ASAP to [facility]. We need to find a kidney doctor for you closer to where you live and I want a blood pressure recheck within a week or two if at all possible. I offered to have you back here next week but you declined.   I am very worried about stroke risk- restart amlodipine but also cholesterol medicine atorvastatin  I sent all meds to [place] for 6 months  If you have not established with another provider- please do see me in November to follow up but I really really would like to see you sooner  Meds ordered this encounter  Medications  . amLODipine (NORVASC) 10 MG tablet    Sig: Take 1 tablet (10 mg total) by mouth daily.    Dispense:  90 tablet    Refill:  1  . losartan-hydrochlorothiazide (HYZAAR) 100-25 MG tablet    Sig: Take 1 tablet by mouth daily.    Dispense:  90 tablet    Refill:  1  . atorvastatin (LIPITOR) 40 MG tablet    Sig: Take 1 tablet (40 mg total) by mouth daily.    Dispense:  90 tablet    Refill:  1  . levothyroxine (SYNTHROID, LEVOTHROID) 88 MCG tablet    Sig: Take 1 tablet (88 mcg total) by mouth daily.    Dispense:  90 tablet    Refill:  1

## 2015-10-05 NOTE — Progress Notes (Signed)
Subjective:  Jacqueline ParishHelena J King is a 70 y.o. year old very pleasant female patient who presents for/with See problem oriented charting ROS- No facial or extremity weakness. No  trouble swallowing. no blurry vision or double vision. No paresthesias. No confusion or word finding difficulties. Marland Kitchen.see any ROS included in HPI as well.   Past Medical History-  Patient Active Problem List   Diagnosis Date Noted  . Cerebral hemorrhage (HCC) 07/05/2011    Priority: High  . CVA (cerebral infarction) 07/05/2011    Priority: High  . Hyperlipidemia with target LDL less than 100 10/13/2013    Priority: Medium  . HTN (hypertension) 07/05/2011    Priority: Medium  . Hypothyroidism following radioiodine therapy 07/05/2011    Priority: Medium  . Renal artery stenosis (HCC) 10/06/2015    Medications- reviewed and updated Current Outpatient Prescriptions  Medication Sig Dispense Refill  . aspirin EC 81 MG tablet Take 1 tablet (81 mg total) by mouth daily.    Marland Kitchen. levothyroxine (SYNTHROID, LEVOTHROID) 88 MCG tablet TAKE 1 TABLET BY MOUTH DAILY 30 tablet 3  . losartan-hydrochlorothiazide (HYZAAR) 100-25 MG tablet Take 1 tablet by mouth daily. 90 tablet 1   No current facility-administered medications for this visit.     Objective: BP (!) 180/100 (BP Location: Left Arm, Patient Position: Sitting, Cuff Size: Normal)   Pulse 86   Temp 97.9 F (36.6 C) (Oral)   Ht 5' 0.5" (1.537 m)   Wt 100 lb (45.4 kg)   SpO2 97%   BMI 19.21 kg/m  Gen: NAD, resting comfortably CV: RRR no murmurs rubs or gallops Lungs: CTAB no crackles, wheeze, rhonchi Abdomen: soft/nontender/nondistended/normal bowel sounds. No rebound or guarding.  Ext: no edema Skin: warm, dry Neuro: CN II-XII intact- does have mild stutering of speech which she states is her baseline, sensation and reflexes normal throughout, 5/5 muscle strength in bilateral upper and lower extremities. Normal finger to nose. Normal rapid alternating movements. No  pronator drift. Normal romberg. Normal gait.    Assessment/Plan:  Renal artery stenosis (HCC) Hypertension Hyperlipidemia Stroke S: very high risk patient with history cva as well as renal artery stenosis. Blood pressure very elevated today. Patient on losartan- HCT 100-25 mgbut no other medication- prior on amlodipine. Lipids previously controleld on atorvastatin which she is no longer on. Fortunately taking aspirindoes not appear to have had any follow up for renal artery stenosis even after findings in 2013 showed-  CT angio abd/pelvis 2013 "There is concern for vessel irregularity involving the mid and distal right renal artery and there may be a tiny aneurysm involving a left renal artery branch.  In addition, the superior mesenteric artery has areas of kinking and mild ectasia.  The findings raise the possibility of fibromuscular dysplasia.  This finding could be further evaluated with catheter angiography." A/P: Hypertension puts patient at risk for stroke, renal artery stenosis could contribute to high BPs. Has been on ARB- hesitate to take her off at this point- if blood pressure not improving with amlodipine may have to be considered though. Want that to be decided on by nephrology. I tried to stress importance of follow up to patient and she does not seem to understand seriousness of situation. Patient required extended counseling- I called her daughter Carollee Hertershannon as well with patient permission. Daughter is RN- we discussed getting her into [facility] in [place] and getting quick referral to nephrology. Daughter understands gravity of situation fortunately. Also restart statin given stroke risk. 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 No heat/cold intolerance. No constipation or diarrhea. Denies shakiness or anxiety.  A/P: instructed patient to proceed to lab for tsh as well as other tests listed below- appears patient either forgot or opted not to get this completed unfortunately- will have to be done in [place] when she reestablishes. I strongly encouraged 1 week follow up and she declines- soonest she agreed to was November. I counseled her on extensive risks of not following up closely particularly about her blood pressure.    patient did not go to labs as instructed Orders Placed This Encounter  Procedures  . CBC    [place]  . Comprehensive metabolic panel    [place]  . LDL cholesterol, direct    [place]  . TSH    [place]    Meds ordered this encounter  Medications  . amLODipine (NORVASC) 10 MG tablet    Sig: Take 1 tablet (10 mg total) by mouth daily.    Dispense:  90 tablet    Refill:  1  . losartan-hydrochlorothiazide (HYZAAR) 100-25 MG tablet    Sig: Take 1 tablet by mouth daily.    Dispense:  90 tablet    Refill:  1  . atorvastatin (LIPITOR) 40 MG tablet    Sig: Take 1 tablet (40 mg total) by mouth daily.    Dispense:  90 tablet    Refill:  1  . levothyroxine (SYNTHROID, LEVOTHROID) 88 MCG tablet    Sig: Take 1 tablet (88 mcg total) by mouth daily.    Dispense:  90 tablet    Refill:  1   The duration of face-to-face time during this visit was greater than 45 minutes. Greater than 50% of this time was spent in counseling, explanation of diagnosis, planning of further management, and/or coordination of care including long discussion about stroke risk, risk of BP, meaning of renal artery stenosis, importance of close follow up.    Return precautions advised.  Tana Conch, MD

## 2015-10-06 DIAGNOSIS — I701 Atherosclerosis of renal artery: Secondary | ICD-10-CM | POA: Insufficient documentation

## 2015-10-06 NOTE — Assessment & Plan Note (Signed)
S: Lab Results  Component Value Date   TSH 0.73 08/05/2014   On thyroid medication-levothyroxine 88 mcg previously ROS-No hair or nail changes. No heat/cold intolerance. No constipation or diarrhea. Denies shakiness or anxiety.  A/P: instructed patient to proceed to lab for tsh as well as other tests listed below- appears patient either forgot or opted not to get this completed unfortunately- will have to be done in [place] when she reestablishes. I strongly encouraged 1 week follow up and she declines- soonest she agreed to was November. I counseled her on extensive risks of not following up closely particularly about her blood pressure.

## 2015-10-06 NOTE — Assessment & Plan Note (Addendum)
Hypertension Hyperlipidemia Stroke S: very high risk patient with history cva as well as renal artery stenosis. Blood pressure very elevated today. Patient on losartan- HCT 100-25 mgbut no other medication- prior on amlodipine. Lipids previously controleld on atorvastatin which she is no longer on. Fortunately taking aspirindoes not appear to have had any follow up for renal artery stenosis even after findings in 2013 showed-  CT angio abd/pelvis 2013 "There is concern for vessel irregularity involving the mid and distal right renal artery and there may be a tiny aneurysm involving a left renal artery branch.  In addition, the superior mesenteric artery has areas of kinking and mild ectasia.  The findings raise the possibility of fibromuscular dysplasia.  This finding could be further evaluated with catheter angiography." A/P: Hypertension puts patient at risk for stroke, renal artery stenosis could contribute to high BPs. Has been on ARB- hesitate to take her off at this point- if blood pressure not improving with amlodipine may have to be considered though. Want that to be decided on by nephrology. I tried to stress importance of follow up to patient and she does not seem to understand seriousness of situation. Patient required extended counseling- I called her daughter Carollee Hertershannon as well with patient permission. Daughter is RN- we discussed getting her into [facility] in [place] and getting quick referral to nephrology. Daughter understands gravity of situation fortunately. Also restart statin given stroke risk. 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

## 2015-10-10 ENCOUNTER — Encounter: Payer: Self-pay | Admitting: Family Medicine

## 2016-03-18 ENCOUNTER — Ambulatory Visit: Payer: Self-pay | Admitting: Family Medicine
